# Patient Record
Sex: Male | Born: 1950 | Race: White | Hispanic: No | Smoking: Never smoker
Health system: Southern US, Community
[De-identification: ages and names within clinical notes are randomized; demographics above are authoritative.]

## PROBLEM LIST (undated history)

## (undated) DIAGNOSIS — C679 Malignant neoplasm of bladder, unspecified: Secondary | ICD-10-CM

## (undated) DIAGNOSIS — Z9289 Personal history of other medical treatment: Secondary | ICD-10-CM

## (undated) DIAGNOSIS — K2210 Ulcer of esophagus without bleeding: Secondary | ICD-10-CM

## (undated) DIAGNOSIS — N189 Chronic kidney disease, unspecified: Secondary | ICD-10-CM

## (undated) DIAGNOSIS — R7303 Prediabetes: Secondary | ICD-10-CM

## (undated) DIAGNOSIS — H269 Unspecified cataract: Secondary | ICD-10-CM

## (undated) DIAGNOSIS — G629 Polyneuropathy, unspecified: Secondary | ICD-10-CM

## (undated) DIAGNOSIS — Z9889 Other specified postprocedural states: Secondary | ICD-10-CM

## (undated) DIAGNOSIS — K219 Gastro-esophageal reflux disease without esophagitis: Secondary | ICD-10-CM

## (undated) DIAGNOSIS — C189 Malignant neoplasm of colon, unspecified: Secondary | ICD-10-CM

## (undated) DIAGNOSIS — R112 Nausea with vomiting, unspecified: Secondary | ICD-10-CM

## (undated) DIAGNOSIS — D126 Benign neoplasm of colon, unspecified: Secondary | ICD-10-CM

## (undated) DIAGNOSIS — I4891 Unspecified atrial fibrillation: Secondary | ICD-10-CM

## (undated) DIAGNOSIS — E785 Hyperlipidemia, unspecified: Secondary | ICD-10-CM

## (undated) DIAGNOSIS — K648 Other hemorrhoids: Secondary | ICD-10-CM

## (undated) DIAGNOSIS — K449 Diaphragmatic hernia without obstruction or gangrene: Secondary | ICD-10-CM

## (undated) DIAGNOSIS — I10 Essential (primary) hypertension: Secondary | ICD-10-CM

## (undated) HISTORY — DX: Malignant neoplasm of colon, unspecified: C18.9

## (undated) HISTORY — PX: CHOLECYSTECTOMY: SHX55

## (undated) HISTORY — DX: Personal history of other medical treatment: Z92.89

## (undated) HISTORY — PX: APPENDECTOMY: SHX54

## (undated) HISTORY — PX: COLON SURGERY: SHX602

## (undated) HISTORY — DX: Prediabetes: R73.03

## (undated) HISTORY — DX: Unspecified atrial fibrillation: I48.91

## (undated) HISTORY — DX: Polyneuropathy, unspecified: G62.9

## (undated) HISTORY — PX: CARDIAC CATHETERIZATION: SHX172

## (undated) HISTORY — DX: Malignant neoplasm of bladder, unspecified: C67.9

## (undated) HISTORY — DX: Diaphragmatic hernia without obstruction or gangrene: K44.9

## (undated) HISTORY — DX: Benign neoplasm of colon, unspecified: D12.6

## (undated) HISTORY — DX: Ulcer of esophagus without bleeding: K22.10

## (undated) HISTORY — PX: COLONOSCOPY: SHX174

## (undated) HISTORY — DX: Essential (primary) hypertension: I10

## (undated) HISTORY — DX: Unspecified cataract: H26.9

## (undated) HISTORY — DX: Other hemorrhoids: K64.8

---

## 1999-02-16 DIAGNOSIS — C189 Malignant neoplasm of colon, unspecified: Secondary | ICD-10-CM

## 1999-02-16 HISTORY — DX: Malignant neoplasm of colon, unspecified: C18.9

## 1999-08-24 ENCOUNTER — Other Ambulatory Visit: Admission: RE | Admit: 1999-08-24 | Discharge: 1999-08-24 | Payer: Self-pay | Admitting: Gastroenterology

## 1999-08-24 ENCOUNTER — Encounter (INDEPENDENT_AMBULATORY_CARE_PROVIDER_SITE_OTHER): Payer: Self-pay

## 1999-09-04 ENCOUNTER — Encounter: Payer: Self-pay | Admitting: General Surgery

## 1999-09-09 ENCOUNTER — Inpatient Hospital Stay (HOSPITAL_COMMUNITY): Admission: RE | Admit: 1999-09-09 | Discharge: 1999-09-13 | Payer: Self-pay | Admitting: General Surgery

## 1999-09-09 ENCOUNTER — Encounter (INDEPENDENT_AMBULATORY_CARE_PROVIDER_SITE_OTHER): Payer: Self-pay | Admitting: Specialist

## 1999-09-25 ENCOUNTER — Encounter: Payer: Self-pay | Admitting: Oncology

## 1999-09-25 ENCOUNTER — Encounter: Admission: RE | Admit: 1999-09-25 | Discharge: 1999-09-25 | Payer: Self-pay | Admitting: Oncology

## 1999-10-22 ENCOUNTER — Ambulatory Visit (HOSPITAL_BASED_OUTPATIENT_CLINIC_OR_DEPARTMENT_OTHER): Admission: RE | Admit: 1999-10-22 | Discharge: 1999-10-22 | Payer: Self-pay | Admitting: General Surgery

## 1999-10-22 ENCOUNTER — Encounter: Payer: Self-pay | Admitting: General Surgery

## 2000-03-28 ENCOUNTER — Encounter: Admission: RE | Admit: 2000-03-28 | Discharge: 2000-03-28 | Payer: Self-pay | Admitting: Oncology

## 2000-03-28 ENCOUNTER — Encounter: Payer: Self-pay | Admitting: Oncology

## 2000-05-15 ENCOUNTER — Encounter: Payer: Self-pay | Admitting: Emergency Medicine

## 2000-05-15 ENCOUNTER — Inpatient Hospital Stay (HOSPITAL_COMMUNITY): Admission: EM | Admit: 2000-05-15 | Discharge: 2000-05-16 | Payer: Self-pay | Admitting: Emergency Medicine

## 2000-06-07 ENCOUNTER — Ambulatory Visit (HOSPITAL_BASED_OUTPATIENT_CLINIC_OR_DEPARTMENT_OTHER): Admission: RE | Admit: 2000-06-07 | Discharge: 2000-06-07 | Payer: Self-pay | Admitting: General Surgery

## 2000-07-12 ENCOUNTER — Encounter (INDEPENDENT_AMBULATORY_CARE_PROVIDER_SITE_OTHER): Payer: Self-pay

## 2000-07-12 ENCOUNTER — Other Ambulatory Visit: Admission: RE | Admit: 2000-07-12 | Discharge: 2000-07-12 | Payer: Self-pay | Admitting: Gastroenterology

## 2000-08-15 ENCOUNTER — Encounter: Payer: Self-pay | Admitting: Oncology

## 2000-08-15 ENCOUNTER — Encounter: Admission: RE | Admit: 2000-08-15 | Discharge: 2000-08-15 | Payer: Self-pay

## 2001-03-27 ENCOUNTER — Encounter: Payer: Self-pay | Admitting: Oncology

## 2001-03-27 ENCOUNTER — Encounter: Admission: RE | Admit: 2001-03-27 | Discharge: 2001-03-27 | Payer: Self-pay | Admitting: Oncology

## 2001-06-21 ENCOUNTER — Encounter: Admission: RE | Admit: 2001-06-21 | Discharge: 2001-09-19 | Payer: Self-pay

## 2001-09-22 ENCOUNTER — Encounter: Admission: RE | Admit: 2001-09-22 | Discharge: 2001-11-14 | Payer: Self-pay

## 2001-10-09 ENCOUNTER — Encounter: Payer: Self-pay | Admitting: Oncology

## 2001-10-09 ENCOUNTER — Encounter: Admission: RE | Admit: 2001-10-09 | Discharge: 2001-10-09 | Payer: Self-pay | Admitting: Oncology

## 2001-10-10 ENCOUNTER — Ambulatory Visit (HOSPITAL_COMMUNITY): Admission: RE | Admit: 2001-10-10 | Discharge: 2001-10-10 | Payer: Self-pay | Admitting: Oncology

## 2001-10-10 ENCOUNTER — Encounter: Payer: Self-pay | Admitting: Oncology

## 2001-11-29 ENCOUNTER — Encounter: Admission: RE | Admit: 2001-11-29 | Discharge: 2002-02-27 | Payer: Self-pay

## 2002-03-19 ENCOUNTER — Ambulatory Visit (HOSPITAL_COMMUNITY): Admission: RE | Admit: 2002-03-19 | Discharge: 2002-03-19 | Payer: Self-pay | Admitting: Oncology

## 2002-03-19 ENCOUNTER — Encounter: Payer: Self-pay | Admitting: Oncology

## 2002-04-26 ENCOUNTER — Encounter: Admission: RE | Admit: 2002-04-26 | Discharge: 2002-07-25 | Payer: Self-pay

## 2002-08-09 ENCOUNTER — Encounter
Admission: RE | Admit: 2002-08-09 | Discharge: 2002-11-07 | Payer: Self-pay | Admitting: Physical Medicine & Rehabilitation

## 2002-09-05 ENCOUNTER — Ambulatory Visit (HOSPITAL_COMMUNITY): Admission: RE | Admit: 2002-09-05 | Discharge: 2002-09-05 | Payer: Self-pay | Admitting: Oncology

## 2002-09-05 ENCOUNTER — Encounter: Payer: Self-pay | Admitting: Oncology

## 2002-10-08 ENCOUNTER — Encounter: Payer: Self-pay | Admitting: Neurosurgery

## 2002-10-09 ENCOUNTER — Ambulatory Visit (HOSPITAL_COMMUNITY): Admission: RE | Admit: 2002-10-09 | Discharge: 2002-10-09 | Payer: Self-pay | Admitting: Neurosurgery

## 2002-10-09 ENCOUNTER — Encounter: Payer: Self-pay | Admitting: Neurosurgery

## 2002-10-12 ENCOUNTER — Inpatient Hospital Stay (HOSPITAL_COMMUNITY): Admission: AD | Admit: 2002-10-12 | Discharge: 2002-10-16 | Payer: Self-pay | Admitting: Neurosurgery

## 2002-10-29 ENCOUNTER — Encounter: Payer: Self-pay | Admitting: Oncology

## 2002-10-29 ENCOUNTER — Encounter: Admission: RE | Admit: 2002-10-29 | Discharge: 2002-10-29 | Payer: Self-pay | Admitting: Oncology

## 2002-11-06 ENCOUNTER — Encounter: Payer: Self-pay | Admitting: Neurosurgery

## 2002-11-06 ENCOUNTER — Ambulatory Visit (HOSPITAL_COMMUNITY): Admission: RE | Admit: 2002-11-06 | Discharge: 2002-11-07 | Payer: Self-pay | Admitting: Neurosurgery

## 2002-12-03 ENCOUNTER — Encounter
Admission: RE | Admit: 2002-12-03 | Discharge: 2003-03-03 | Payer: Self-pay | Admitting: Physical Medicine & Rehabilitation

## 2003-03-19 ENCOUNTER — Encounter
Admission: RE | Admit: 2003-03-19 | Discharge: 2003-06-17 | Payer: Self-pay | Admitting: Physical Medicine & Rehabilitation

## 2003-03-26 ENCOUNTER — Ambulatory Visit (HOSPITAL_COMMUNITY): Admission: RE | Admit: 2003-03-26 | Discharge: 2003-03-26 | Payer: Self-pay | Admitting: Oncology

## 2003-05-06 ENCOUNTER — Encounter (INDEPENDENT_AMBULATORY_CARE_PROVIDER_SITE_OTHER): Payer: Self-pay | Admitting: Specialist

## 2003-05-06 ENCOUNTER — Ambulatory Visit (HOSPITAL_COMMUNITY): Admission: RE | Admit: 2003-05-06 | Discharge: 2003-05-06 | Payer: Self-pay | Admitting: Urology

## 2003-05-06 ENCOUNTER — Ambulatory Visit (HOSPITAL_BASED_OUTPATIENT_CLINIC_OR_DEPARTMENT_OTHER): Admission: RE | Admit: 2003-05-06 | Discharge: 2003-05-06 | Payer: Self-pay | Admitting: Urology

## 2003-05-11 ENCOUNTER — Observation Stay (HOSPITAL_COMMUNITY): Admission: EM | Admit: 2003-05-11 | Discharge: 2003-05-11 | Payer: Self-pay | Admitting: Emergency Medicine

## 2003-05-13 ENCOUNTER — Inpatient Hospital Stay (HOSPITAL_COMMUNITY): Admission: EM | Admit: 2003-05-13 | Discharge: 2003-05-17 | Payer: Self-pay | Admitting: Urology

## 2003-05-29 ENCOUNTER — Encounter: Admission: RE | Admit: 2003-05-29 | Discharge: 2003-05-29 | Payer: Self-pay | Admitting: Urology

## 2003-05-31 ENCOUNTER — Inpatient Hospital Stay (HOSPITAL_COMMUNITY): Admission: AD | Admit: 2003-05-31 | Discharge: 2003-06-07 | Payer: Self-pay | Admitting: Gastroenterology

## 2003-07-01 ENCOUNTER — Encounter
Admission: RE | Admit: 2003-07-01 | Discharge: 2003-09-29 | Payer: Self-pay | Admitting: Physical Medicine & Rehabilitation

## 2003-08-28 ENCOUNTER — Encounter: Payer: Self-pay | Admitting: Gastroenterology

## 2003-09-02 ENCOUNTER — Ambulatory Visit (HOSPITAL_BASED_OUTPATIENT_CLINIC_OR_DEPARTMENT_OTHER): Admission: RE | Admit: 2003-09-02 | Discharge: 2003-09-02 | Payer: Self-pay | Admitting: Urology

## 2003-09-25 ENCOUNTER — Encounter
Admission: RE | Admit: 2003-09-25 | Discharge: 2003-11-26 | Payer: Self-pay | Admitting: Physical Medicine & Rehabilitation

## 2003-11-26 ENCOUNTER — Encounter
Admission: RE | Admit: 2003-11-26 | Discharge: 2004-02-24 | Payer: Self-pay | Admitting: Physical Medicine & Rehabilitation

## 2003-11-27 ENCOUNTER — Ambulatory Visit: Payer: Self-pay | Admitting: Physical Medicine & Rehabilitation

## 2003-12-29 ENCOUNTER — Ambulatory Visit (HOSPITAL_COMMUNITY): Admission: RE | Admit: 2003-12-29 | Discharge: 2003-12-29 | Payer: Self-pay | Admitting: Family Medicine

## 2004-02-05 ENCOUNTER — Ambulatory Visit: Payer: Self-pay | Admitting: Physical Medicine & Rehabilitation

## 2004-02-16 DIAGNOSIS — C679 Malignant neoplasm of bladder, unspecified: Secondary | ICD-10-CM

## 2004-02-16 HISTORY — PX: BLADDER REMOVAL: SHX567

## 2004-02-16 HISTORY — PX: PROSTATECTOMY: SHX69

## 2004-02-16 HISTORY — DX: Malignant neoplasm of bladder, unspecified: C67.9

## 2004-03-27 ENCOUNTER — Ambulatory Visit: Payer: Self-pay | Admitting: Oncology

## 2004-04-07 ENCOUNTER — Inpatient Hospital Stay (HOSPITAL_COMMUNITY): Admission: EM | Admit: 2004-04-07 | Discharge: 2004-04-18 | Payer: Self-pay | Admitting: Emergency Medicine

## 2004-06-11 ENCOUNTER — Encounter: Admission: RE | Admit: 2004-06-11 | Discharge: 2004-06-11 | Payer: Self-pay | Admitting: Family Medicine

## 2004-08-20 ENCOUNTER — Encounter (HOSPITAL_COMMUNITY): Admission: RE | Admit: 2004-08-20 | Discharge: 2004-11-18 | Payer: Self-pay | Admitting: Nephrology

## 2004-09-14 ENCOUNTER — Ambulatory Visit: Payer: Self-pay | Admitting: Oncology

## 2004-09-15 ENCOUNTER — Ambulatory Visit (HOSPITAL_COMMUNITY): Admission: RE | Admit: 2004-09-15 | Discharge: 2004-09-15 | Payer: Self-pay | Admitting: Oncology

## 2004-11-07 IMAGING — CR DG ABDOMEN 2V
2 series · 2 of 2 positions shown · non-contrast
Comparison: 05/31/03.

CLINICAL DATA: Abdominal pain.  History of colon carcinoma. 
ABDOMEN, TWO VIEWS 06/02/03

[view not recorded (1 of 2)]
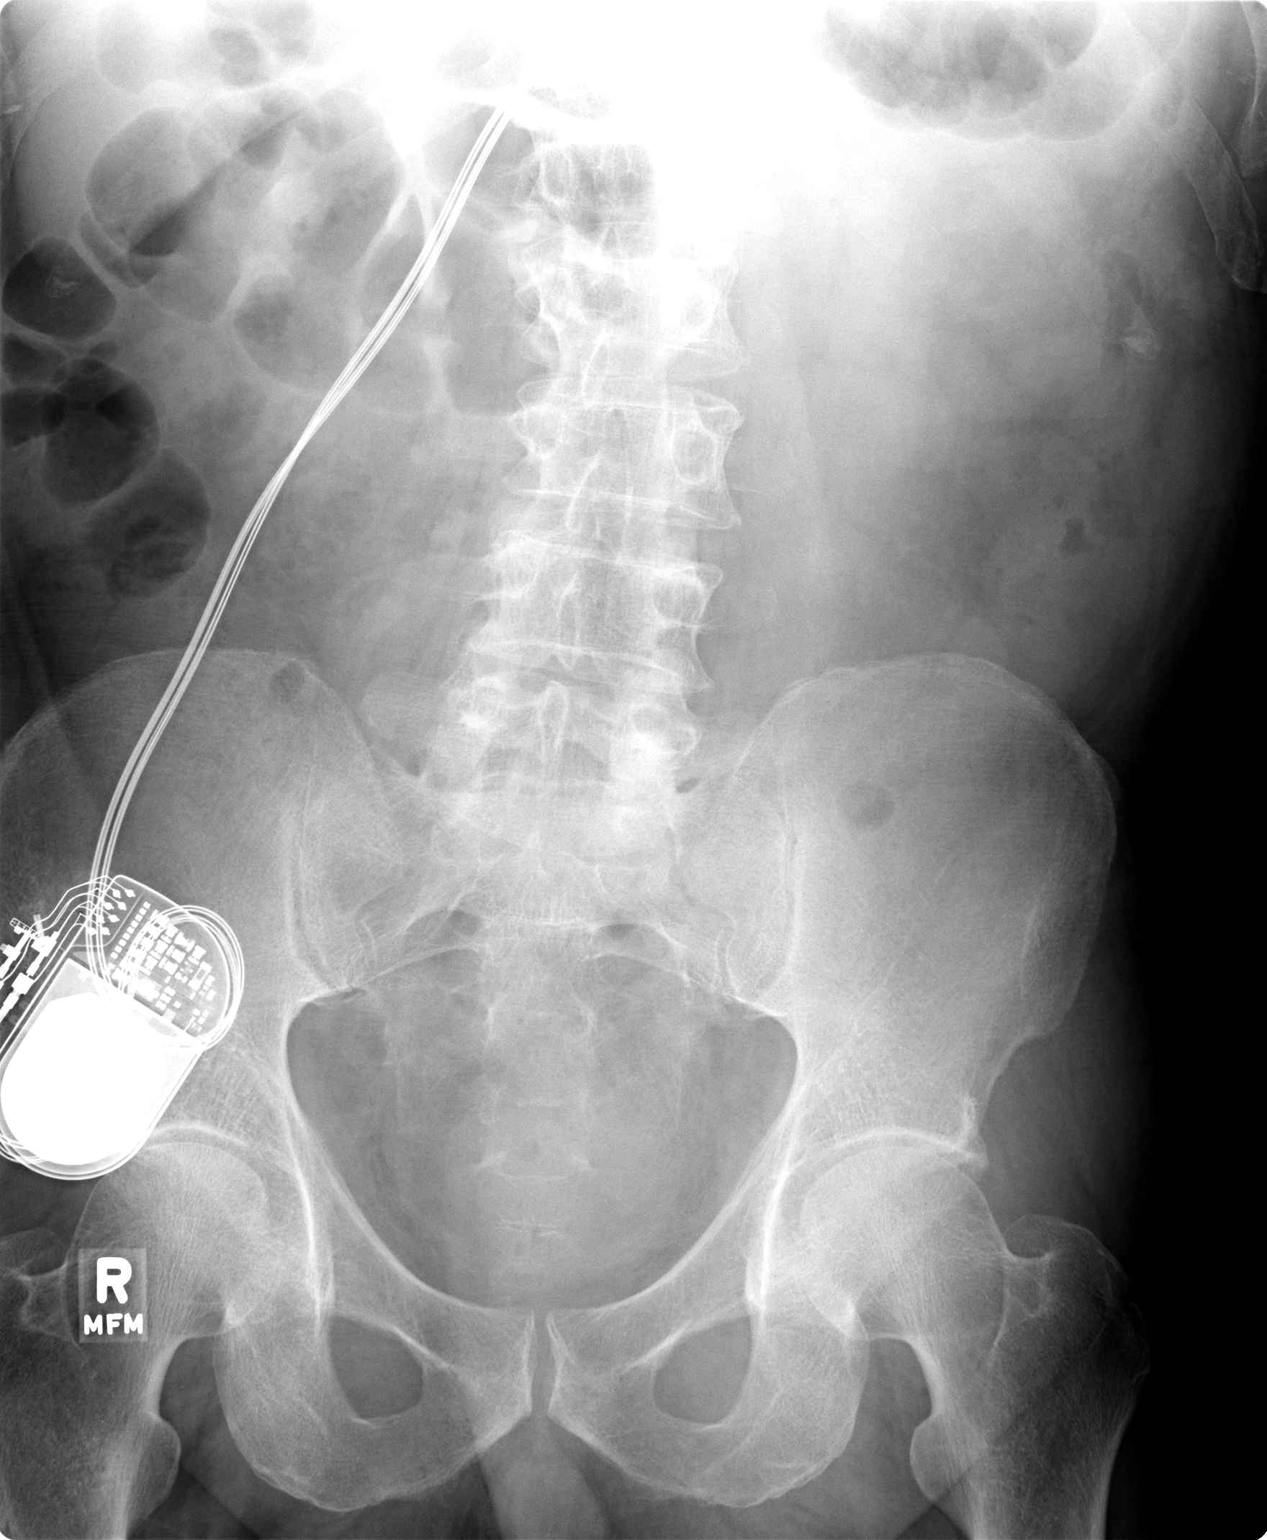

[view not recorded (2 of 2)]
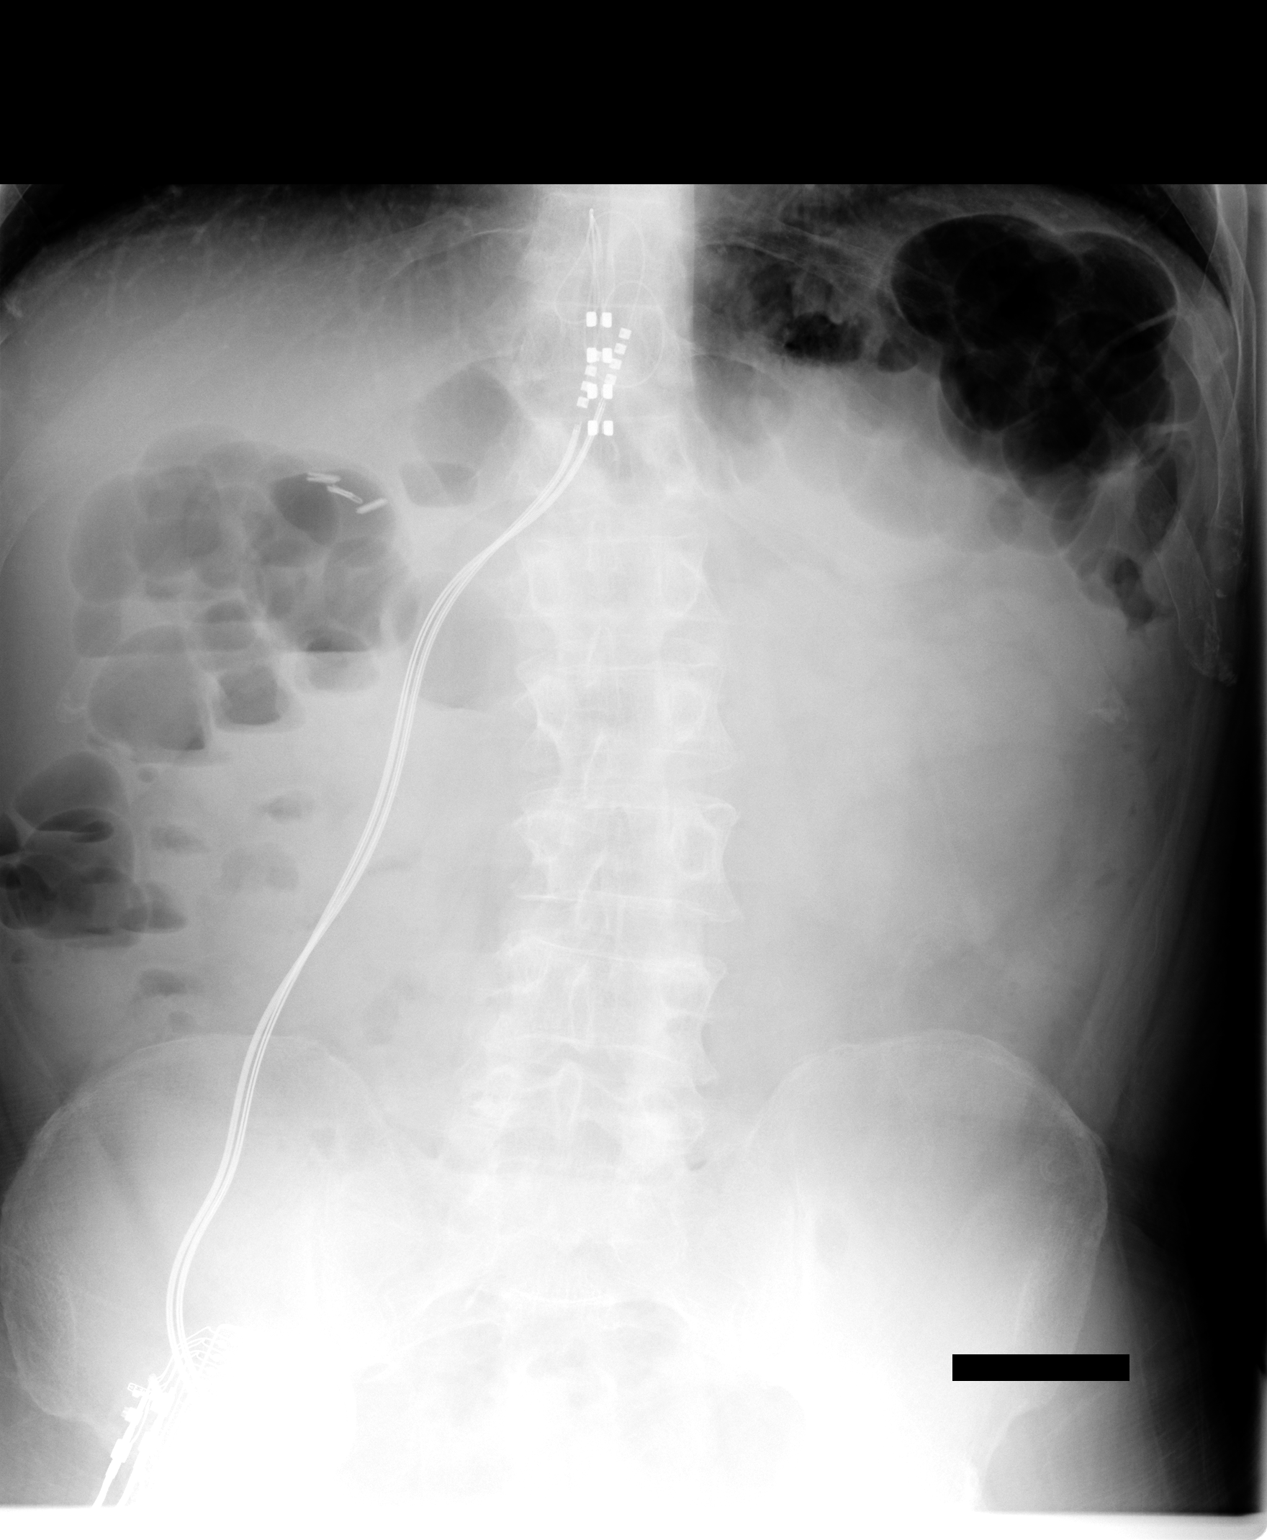

[2 of 2 positions shown; findings below may reference images not displayed]

FINDINGS: Decreasing gaseous distention of small bowel and colon is nonspecific.  No definite pneumoperitoneum.  Evidence of cholecystectomy and spinal implant/stimulator noted.  
IMPRESSION
Slightly decreasing mild gaseous distention of small bowel and colon.

## 2004-11-23 ENCOUNTER — Encounter
Admission: RE | Admit: 2004-11-23 | Discharge: 2005-02-21 | Payer: Self-pay | Admitting: Physical Medicine & Rehabilitation

## 2004-11-23 ENCOUNTER — Ambulatory Visit: Payer: Self-pay | Admitting: Physical Medicine & Rehabilitation

## 2004-12-21 ENCOUNTER — Ambulatory Visit (HOSPITAL_COMMUNITY): Admission: RE | Admit: 2004-12-21 | Discharge: 2004-12-21 | Payer: Self-pay | Admitting: Oncology

## 2004-12-28 ENCOUNTER — Ambulatory Visit: Payer: Self-pay | Admitting: Oncology

## 2004-12-31 ENCOUNTER — Ambulatory Visit: Payer: Self-pay | Admitting: Gastroenterology

## 2005-01-01 ENCOUNTER — Ambulatory Visit: Payer: Self-pay | Admitting: Gastroenterology

## 2005-01-01 ENCOUNTER — Encounter (INDEPENDENT_AMBULATORY_CARE_PROVIDER_SITE_OTHER): Payer: Self-pay | Admitting: *Deleted

## 2005-01-18 ENCOUNTER — Ambulatory Visit: Payer: Self-pay | Admitting: Physical Medicine & Rehabilitation

## 2005-04-16 ENCOUNTER — Encounter
Admission: RE | Admit: 2005-04-16 | Discharge: 2005-07-15 | Payer: Self-pay | Admitting: Physical Medicine & Rehabilitation

## 2005-04-16 ENCOUNTER — Ambulatory Visit: Payer: Self-pay | Admitting: Physical Medicine & Rehabilitation

## 2005-06-14 ENCOUNTER — Ambulatory Visit: Payer: Self-pay | Admitting: Gastroenterology

## 2005-07-19 ENCOUNTER — Encounter
Admission: RE | Admit: 2005-07-19 | Discharge: 2005-10-17 | Payer: Self-pay | Admitting: Physical Medicine & Rehabilitation

## 2005-07-27 ENCOUNTER — Ambulatory Visit: Payer: Self-pay | Admitting: Physical Medicine & Rehabilitation

## 2005-10-26 ENCOUNTER — Encounter
Admission: RE | Admit: 2005-10-26 | Discharge: 2006-01-24 | Payer: Self-pay | Admitting: Physical Medicine & Rehabilitation

## 2005-10-26 ENCOUNTER — Ambulatory Visit: Payer: Self-pay | Admitting: Physical Medicine & Rehabilitation

## 2005-11-17 IMAGING — CR DG ABDOMEN 1V
2 series · 2 of 2 positions shown · non-contrast
Comparison: 06/02/03.

CLINICAL DATA: Bladder cancer, abdominal pain.  Check double J ureteral stent placement.
 ABDOMEN, ONE VIEW:

[t abdomen supine (1 of 2)]
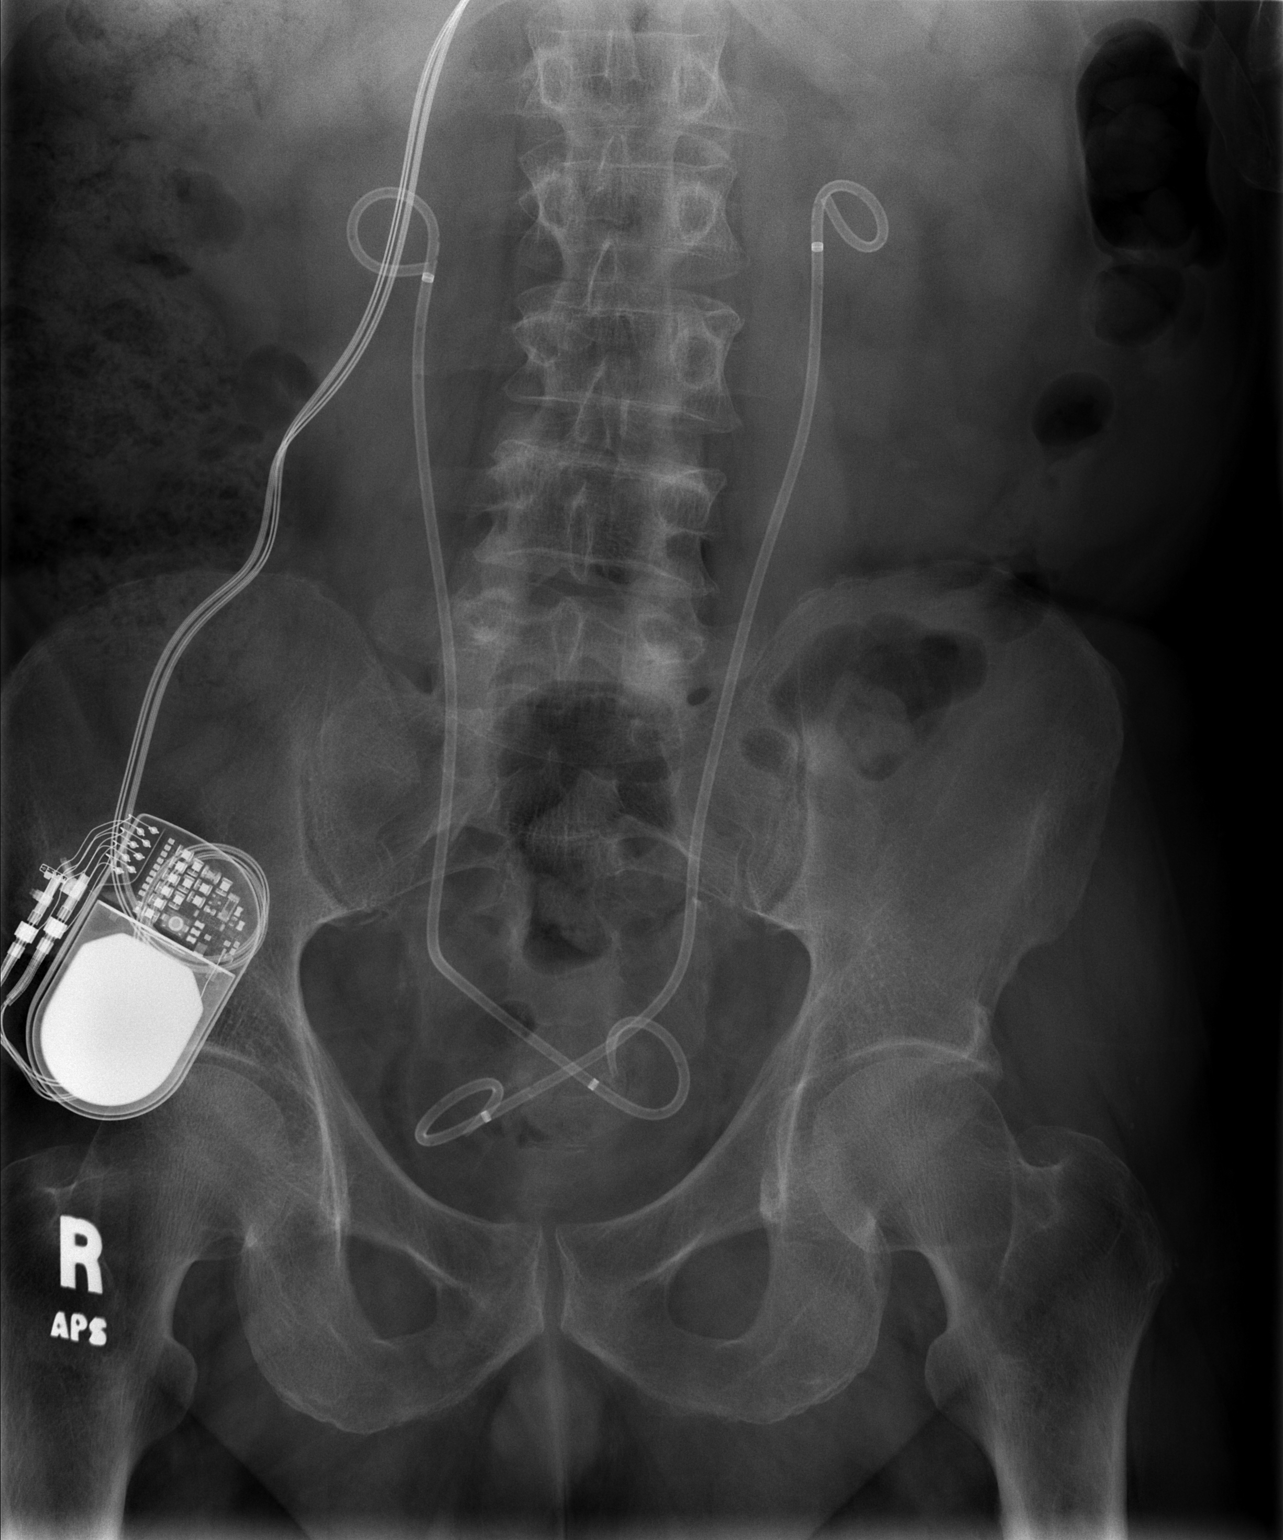

[t abdomen supine (2 of 2)]
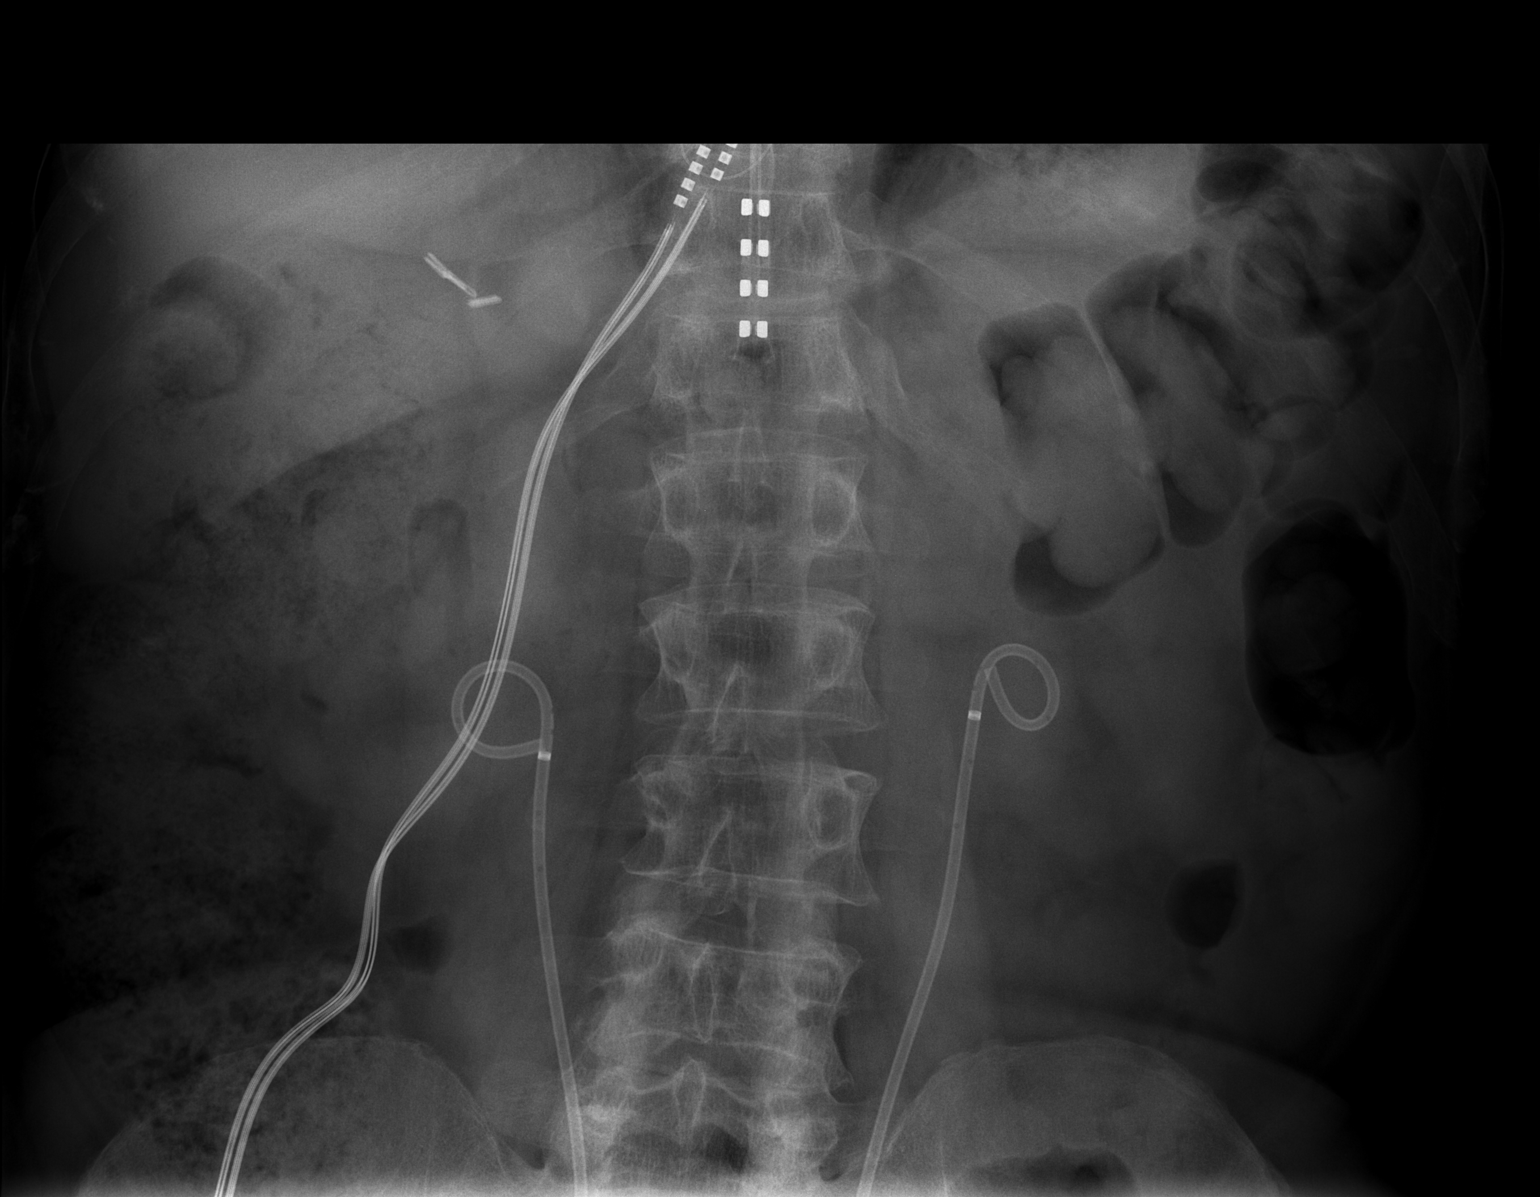

[2 of 2 positions shown; findings below may reference images not displayed]

There are bilateral double J ureteral stents that appear to be appropriately positioned.  Bowel gas pattern is normal. Electronic pacing device projecting over the right abdomen.
IMPRESSION: Bilateral double J ureteral stents appear to be appropriately placed in one view.

## 2005-12-17 ENCOUNTER — Ambulatory Visit: Payer: Self-pay | Admitting: Oncology

## 2005-12-28 LAB — CBC WITH DIFFERENTIAL/PLATELET
BASO%: 1.4 % (ref 0.0–2.0)
Basophils Absolute: 0.1 10*3/uL (ref 0.0–0.1)
EOS%: 3.5 % (ref 0.0–7.0)
Eosinophils Absolute: 0.2 10*3/uL (ref 0.0–0.5)
HCT: 43.4 % (ref 38.7–49.9)
HGB: 14.6 g/dL (ref 13.0–17.1)
LYMPH%: 32.1 % (ref 14.0–48.0)
MCH: 28.8 pg (ref 28.0–33.4)
MCHC: 33.7 g/dL (ref 32.0–35.9)
MCV: 85.6 fL (ref 81.6–98.0)
MONO#: 0.6 10*3/uL (ref 0.1–0.9)
MONO%: 9.8 % (ref 0.0–13.0)
NEUT#: 3.4 10*3/uL (ref 1.5–6.5)
NEUT%: 53.2 % (ref 40.0–75.0)
Platelets: 229 10*3/uL (ref 145–400)
RBC: 5.08 10*6/uL (ref 4.20–5.71)
RDW: 14.1 % (ref 11.2–14.6)
WBC: 6.5 10*3/uL (ref 4.0–10.0)
lymph#: 2.1 10*3/uL (ref 0.9–3.3)

## 2005-12-28 LAB — COMPREHENSIVE METABOLIC PANEL
ALT: 14 U/L (ref 0–53)
AST: 17 U/L (ref 0–37)
Albumin: 4.3 g/dL (ref 3.5–5.2)
Alkaline Phosphatase: 126 U/L — ABNORMAL HIGH (ref 39–117)
BUN: 20 mg/dL (ref 6–23)
CO2: 28 mEq/L (ref 19–32)
Calcium: 9.5 mg/dL (ref 8.4–10.5)
Chloride: 108 mEq/L (ref 96–112)
Creatinine, Ser: 1.49 mg/dL (ref 0.40–1.50)
Glucose, Bld: 98 mg/dL (ref 70–99)
Potassium: 4.6 mEq/L (ref 3.5–5.3)
Sodium: 140 mEq/L (ref 135–145)
Total Bilirubin: 0.5 mg/dL (ref 0.3–1.2)
Total Protein: 7.7 g/dL (ref 6.0–8.3)

## 2005-12-28 LAB — CEA: CEA: <0.5 ng/mL (ref 0.0–5.0)

## 2006-01-21 ENCOUNTER — Encounter
Admission: RE | Admit: 2006-01-21 | Discharge: 2006-04-21 | Payer: Self-pay | Admitting: Physical Medicine & Rehabilitation

## 2006-01-21 ENCOUNTER — Ambulatory Visit: Payer: Self-pay | Admitting: Physical Medicine & Rehabilitation

## 2006-03-17 ENCOUNTER — Ambulatory Visit (HOSPITAL_COMMUNITY): Admission: RE | Admit: 2006-03-17 | Discharge: 2006-03-17 | Payer: Self-pay | Admitting: Oncology

## 2006-08-29 ENCOUNTER — Encounter
Admission: RE | Admit: 2006-08-29 | Discharge: 2006-08-30 | Payer: Self-pay | Admitting: Physical Medicine & Rehabilitation

## 2006-08-29 ENCOUNTER — Ambulatory Visit: Payer: Self-pay | Admitting: Physical Medicine & Rehabilitation

## 2006-12-16 ENCOUNTER — Ambulatory Visit: Payer: Self-pay | Admitting: Gastroenterology

## 2006-12-16 ENCOUNTER — Ambulatory Visit: Payer: Self-pay | Admitting: Oncology

## 2006-12-22 LAB — COMPREHENSIVE METABOLIC PANEL
ALT: 17 U/L (ref 0–53)
AST: 19 U/L (ref 0–37)
Albumin: 4.3 g/dL (ref 3.5–5.2)
Alkaline Phosphatase: 103 U/L (ref 39–117)
BUN: 20 mg/dL (ref 6–23)
CO2: 23 mEq/L (ref 19–32)
Calcium: 9.9 mg/dL (ref 8.4–10.5)
Chloride: 104 mEq/L (ref 96–112)
Creatinine, Ser: 1.75 mg/dL — ABNORMAL HIGH (ref 0.40–1.50)
Glucose, Bld: 129 mg/dL — ABNORMAL HIGH (ref 70–99)
Potassium: 4.3 mEq/L (ref 3.5–5.3)
Sodium: 142 mEq/L (ref 135–145)
Total Bilirubin: 0.5 mg/dL (ref 0.3–1.2)
Total Protein: 8 g/dL (ref 6.0–8.3)

## 2006-12-22 LAB — CBC WITH DIFFERENTIAL/PLATELET
BASO%: 0.5 % (ref 0.0–2.0)
Basophils Absolute: 0 10*3/uL (ref 0.0–0.1)
EOS%: 3 % (ref 0.0–7.0)
Eosinophils Absolute: 0.2 10*3/uL (ref 0.0–0.5)
HCT: 43.6 % (ref 38.7–49.9)
HGB: 15 g/dL (ref 13.0–17.1)
LYMPH%: 31.3 % (ref 14.0–48.0)
MCH: 29.7 pg (ref 28.0–33.4)
MCHC: 34.3 g/dL (ref 32.0–35.9)
MCV: 86.6 fL (ref 81.6–98.0)
MONO#: 0.4 10*3/uL (ref 0.1–0.9)
MONO%: 6.4 % (ref 0.0–13.0)
NEUT#: 4 10*3/uL (ref 1.5–6.5)
NEUT%: 58.8 % (ref 40.0–75.0)
Platelets: 251 10*3/uL (ref 145–400)
RBC: 5.04 10*6/uL (ref 4.20–5.71)
RDW: 13.4 % (ref 11.2–14.6)
WBC: 6.8 10*3/uL (ref 4.0–10.0)
lymph#: 2.1 10*3/uL (ref 0.9–3.3)

## 2006-12-22 LAB — CEA: CEA: 0.5 ng/mL (ref 0.0–5.0)

## 2006-12-23 ENCOUNTER — Ambulatory Visit (HOSPITAL_COMMUNITY): Admission: RE | Admit: 2006-12-23 | Discharge: 2006-12-23 | Payer: Self-pay | Admitting: Oncology

## 2006-12-30 ENCOUNTER — Encounter: Payer: Self-pay | Admitting: Gastroenterology

## 2006-12-30 ENCOUNTER — Ambulatory Visit: Payer: Self-pay | Admitting: Gastroenterology

## 2007-09-21 ENCOUNTER — Encounter
Admission: RE | Admit: 2007-09-21 | Discharge: 2007-09-25 | Payer: Self-pay | Admitting: Physical Medicine & Rehabilitation

## 2007-09-25 ENCOUNTER — Ambulatory Visit: Payer: Self-pay | Admitting: Physical Medicine & Rehabilitation

## 2007-11-10 DIAGNOSIS — Z860100 Personal history of colon polyps, unspecified: Secondary | ICD-10-CM | POA: Insufficient documentation

## 2007-11-10 DIAGNOSIS — Z85038 Personal history of other malignant neoplasm of large intestine: Secondary | ICD-10-CM | POA: Insufficient documentation

## 2007-11-10 DIAGNOSIS — K219 Gastro-esophageal reflux disease without esophagitis: Secondary | ICD-10-CM | POA: Insufficient documentation

## 2007-11-14 ENCOUNTER — Ambulatory Visit: Payer: Self-pay | Admitting: Gastroenterology

## 2007-11-14 DIAGNOSIS — M542 Cervicalgia: Secondary | ICD-10-CM | POA: Insufficient documentation

## 2007-11-14 DIAGNOSIS — R1319 Other dysphagia: Secondary | ICD-10-CM | POA: Insufficient documentation

## 2007-11-15 ENCOUNTER — Ambulatory Visit: Payer: Self-pay | Admitting: Gastroenterology

## 2007-12-19 ENCOUNTER — Ambulatory Visit: Payer: Self-pay | Admitting: Oncology

## 2007-12-21 LAB — COMPREHENSIVE METABOLIC PANEL
ALT: 34 U/L (ref 0–53)
AST: 26 U/L (ref 0–37)
Albumin: 4.6 g/dL (ref 3.5–5.2)
Alkaline Phosphatase: 127 U/L — ABNORMAL HIGH (ref 39–117)
BUN: 25 mg/dL — ABNORMAL HIGH (ref 6–23)
CO2: 24 mEq/L (ref 19–32)
Calcium: 10.1 mg/dL (ref 8.4–10.5)
Chloride: 100 mEq/L (ref 96–112)
Creatinine, Ser: 1.94 mg/dL — ABNORMAL HIGH (ref 0.40–1.50)
Glucose, Bld: 165 mg/dL — ABNORMAL HIGH (ref 70–99)
Potassium: 4.8 mEq/L (ref 3.5–5.3)
Sodium: 138 mEq/L (ref 135–145)
Total Bilirubin: 0.7 mg/dL (ref 0.3–1.2)
Total Protein: 8.3 g/dL (ref 6.0–8.3)

## 2007-12-21 LAB — CBC WITH DIFFERENTIAL/PLATELET
BASO%: 0.3 % (ref 0.0–2.0)
Basophils Absolute: 0 10*3/uL (ref 0.0–0.1)
EOS%: 2.5 % (ref 0.0–7.0)
Eosinophils Absolute: 0.2 10*3/uL (ref 0.0–0.5)
HCT: 46.3 % (ref 38.7–49.9)
HGB: 15.9 g/dL (ref 13.0–17.1)
LYMPH%: 22.2 % (ref 14.0–48.0)
MCH: 30 pg (ref 28.0–33.4)
MCHC: 34.3 g/dL (ref 32.0–35.9)
MCV: 87.4 fL (ref 81.6–98.0)
MONO#: 0.4 10*3/uL (ref 0.1–0.9)
MONO%: 6.1 % (ref 0.0–13.0)
NEUT#: 4.7 10*3/uL (ref 1.5–6.5)
NEUT%: 68.9 % (ref 40.0–75.0)
Platelets: 189 10*3/uL (ref 145–400)
RBC: 5.3 10*6/uL (ref 4.20–5.71)
RDW: 13.3 % (ref 11.2–14.6)
WBC: 6.9 10*3/uL (ref 4.0–10.0)
lymph#: 1.5 10*3/uL (ref 0.9–3.3)

## 2007-12-21 LAB — CEA: CEA: <0.5 ng/mL (ref 0.0–5.0)

## 2007-12-25 ENCOUNTER — Ambulatory Visit (HOSPITAL_COMMUNITY): Admission: RE | Admit: 2007-12-25 | Discharge: 2007-12-25 | Payer: Self-pay | Admitting: Oncology

## 2007-12-28 ENCOUNTER — Encounter: Payer: Self-pay | Admitting: Gastroenterology

## 2008-01-02 ENCOUNTER — Encounter: Admission: RE | Admit: 2008-01-02 | Discharge: 2008-01-02 | Payer: Self-pay | Admitting: Nephrology

## 2008-05-23 ENCOUNTER — Encounter
Admission: RE | Admit: 2008-05-23 | Discharge: 2008-05-27 | Payer: Self-pay | Admitting: Physical Medicine & Rehabilitation

## 2008-05-27 ENCOUNTER — Ambulatory Visit: Payer: Self-pay | Admitting: Physical Medicine & Rehabilitation

## 2008-07-19 ENCOUNTER — Encounter
Admission: RE | Admit: 2008-07-19 | Discharge: 2008-07-19 | Payer: Self-pay | Admitting: Physical Medicine & Rehabilitation

## 2008-08-27 ENCOUNTER — Encounter
Admission: RE | Admit: 2008-08-27 | Discharge: 2008-08-27 | Payer: Self-pay | Admitting: Physical Medicine & Rehabilitation

## 2008-08-27 ENCOUNTER — Ambulatory Visit: Payer: Self-pay | Admitting: Physical Medicine & Rehabilitation

## 2008-11-18 ENCOUNTER — Encounter
Admission: RE | Admit: 2008-11-18 | Discharge: 2008-12-18 | Payer: Self-pay | Admitting: Physical Medicine & Rehabilitation

## 2008-12-11 ENCOUNTER — Ambulatory Visit: Payer: Self-pay | Admitting: Physical Medicine & Rehabilitation

## 2008-12-12 ENCOUNTER — Ambulatory Visit: Payer: Self-pay | Admitting: Oncology

## 2008-12-17 ENCOUNTER — Ambulatory Visit (HOSPITAL_COMMUNITY): Admission: RE | Admit: 2008-12-17 | Discharge: 2008-12-17 | Payer: Self-pay | Admitting: Oncology

## 2008-12-17 LAB — CBC WITH DIFFERENTIAL/PLATELET
BASO%: 0.8 % (ref 0.0–2.0)
Basophils Absolute: 0 10*3/uL (ref 0.0–0.1)
EOS%: 5.4 % (ref 0.0–7.0)
Eosinophils Absolute: 0.4 10*3/uL (ref 0.0–0.5)
HCT: 44.6 % (ref 38.4–49.9)
HGB: 15 g/dL (ref 13.0–17.1)
LYMPH%: 38.5 % (ref 14.0–49.0)
MCH: 29.8 pg (ref 27.2–33.4)
MCHC: 33.7 g/dL (ref 32.0–36.0)
MCV: 88.6 fL (ref 79.3–98.0)
MONO#: 0.7 10*3/uL (ref 0.1–0.9)
MONO%: 10.5 % (ref 0.0–14.0)
NEUT#: 2.9 10*3/uL (ref 1.5–6.5)
NEUT%: 44.8 % (ref 39.0–75.0)
Platelets: 179 10*3/uL (ref 140–400)
RBC: 5.03 10*6/uL (ref 4.20–5.82)
RDW: 13.1 % (ref 11.0–14.6)
WBC: 6.6 10*3/uL (ref 4.0–10.3)
lymph#: 2.5 10*3/uL (ref 0.9–3.3)

## 2008-12-17 LAB — COMPREHENSIVE METABOLIC PANEL
ALT: 29 U/L (ref 0–53)
AST: 30 U/L (ref 0–37)
Albumin: 4.1 g/dL (ref 3.5–5.2)
Alkaline Phosphatase: 117 U/L (ref 39–117)
BUN: 20 mg/dL (ref 6–23)
CO2: 28 mEq/L (ref 19–32)
Calcium: 9.7 mg/dL (ref 8.4–10.5)
Chloride: 101 mEq/L (ref 96–112)
Creatinine, Ser: 1.47 mg/dL (ref 0.40–1.50)
Glucose, Bld: 97 mg/dL (ref 70–99)
Potassium: 4.3 mEq/L (ref 3.5–5.3)
Sodium: 137 mEq/L (ref 135–145)
Total Bilirubin: 0.7 mg/dL (ref 0.3–1.2)
Total Protein: 8.4 g/dL — ABNORMAL HIGH (ref 6.0–8.3)

## 2008-12-17 LAB — CEA: CEA: 0.6 ng/mL (ref 0.0–5.0)

## 2008-12-23 ENCOUNTER — Encounter: Payer: Self-pay | Admitting: Gastroenterology

## 2009-07-21 ENCOUNTER — Telehealth (INDEPENDENT_AMBULATORY_CARE_PROVIDER_SITE_OTHER): Payer: Self-pay | Admitting: *Deleted

## 2009-10-12 ENCOUNTER — Emergency Department (HOSPITAL_BASED_OUTPATIENT_CLINIC_OR_DEPARTMENT_OTHER): Admission: EM | Admit: 2009-10-12 | Discharge: 2009-10-12 | Payer: Self-pay | Admitting: Emergency Medicine

## 2009-10-30 ENCOUNTER — Encounter (INDEPENDENT_AMBULATORY_CARE_PROVIDER_SITE_OTHER): Payer: Self-pay | Admitting: *Deleted

## 2009-11-10 ENCOUNTER — Encounter (INDEPENDENT_AMBULATORY_CARE_PROVIDER_SITE_OTHER): Payer: Self-pay | Admitting: *Deleted

## 2009-12-15 ENCOUNTER — Ambulatory Visit: Payer: Self-pay | Admitting: Oncology

## 2009-12-16 DIAGNOSIS — D126 Benign neoplasm of colon, unspecified: Secondary | ICD-10-CM

## 2009-12-16 HISTORY — DX: Benign neoplasm of colon, unspecified: D12.6

## 2009-12-17 ENCOUNTER — Ambulatory Visit (HOSPITAL_COMMUNITY): Admission: RE | Admit: 2009-12-17 | Discharge: 2009-12-17 | Payer: Self-pay | Admitting: Oncology

## 2009-12-17 LAB — COMPREHENSIVE METABOLIC PANEL
ALT: 26 U/L (ref 0–53)
AST: 31 U/L (ref 0–37)
Albumin: 3.7 g/dL (ref 3.5–5.2)
Alkaline Phosphatase: 65 U/L (ref 39–117)
BUN: 22 mg/dL (ref 6–23)
CO2: 30 mEq/L (ref 19–32)
Calcium: 9.8 mg/dL (ref 8.4–10.5)
Chloride: 101 mEq/L (ref 96–112)
Creatinine, Ser: 2.04 mg/dL — ABNORMAL HIGH (ref 0.40–1.50)
Glucose, Bld: 96 mg/dL (ref 70–99)
Potassium: 4.1 mEq/L (ref 3.5–5.3)
Sodium: 139 mEq/L (ref 135–145)
Total Bilirubin: 1 mg/dL (ref 0.3–1.2)
Total Protein: 8.2 g/dL (ref 6.0–8.3)

## 2009-12-17 LAB — CBC WITH DIFFERENTIAL/PLATELET
BASO%: 0.7 % (ref 0.0–2.0)
Basophils Absolute: 0.1 10*3/uL (ref 0.0–0.1)
EOS%: 3.3 % (ref 0.0–7.0)
Eosinophils Absolute: 0.3 10*3/uL (ref 0.0–0.5)
HCT: 40.3 % (ref 38.4–49.9)
HGB: 13.5 g/dL (ref 13.0–17.1)
LYMPH%: 32.3 % (ref 14.0–49.0)
MCH: 29.5 pg (ref 27.2–33.4)
MCHC: 33.4 g/dL (ref 32.0–36.0)
MCV: 88.3 fL (ref 79.3–98.0)
MONO#: 0.8 10*3/uL (ref 0.1–0.9)
MONO%: 10 % (ref 0.0–14.0)
NEUT#: 4 10*3/uL (ref 1.5–6.5)
NEUT%: 53.7 % (ref 39.0–75.0)
Platelets: 226 10*3/uL (ref 140–400)
RBC: 4.57 10*6/uL (ref 4.20–5.82)
RDW: 13.3 % (ref 11.0–14.6)
WBC: 7.5 10*3/uL (ref 4.0–10.3)
lymph#: 2.4 10*3/uL (ref 0.9–3.3)

## 2009-12-17 LAB — CEA: CEA: <0.5 ng/mL (ref 0.0–5.0)

## 2009-12-18 ENCOUNTER — Encounter (INDEPENDENT_AMBULATORY_CARE_PROVIDER_SITE_OTHER): Payer: Self-pay | Admitting: *Deleted

## 2009-12-24 ENCOUNTER — Ambulatory Visit: Payer: Self-pay | Admitting: Gastroenterology

## 2010-01-07 ENCOUNTER — Ambulatory Visit: Payer: Self-pay | Admitting: Gastroenterology

## 2010-01-12 ENCOUNTER — Encounter: Payer: Self-pay | Admitting: Gastroenterology

## 2010-03-07 ENCOUNTER — Encounter: Payer: Self-pay | Admitting: Family Medicine

## 2010-03-07 ENCOUNTER — Encounter: Payer: Self-pay | Admitting: Oncology

## 2010-03-08 ENCOUNTER — Encounter: Payer: Self-pay | Admitting: Urology

## 2010-03-19 NOTE — Procedures (Signed)
Summary: Colonoscopy  Patient: [name] Note: All result statuses are Final unless otherwise noted.  Tests: (1) Colonoscopy (COL)   COL Colonoscopy           DONE (C)     [facility]     [address]     [place], [place]  [address]           COLONOSCOPY PROCEDURE REPORT     PATIENT:  Mark [name], Mark [name]  MR#:  [id number]     BIRTHDATE:  [birth year], 59 yrs. old  GENDER:  male     ENDOSCOPIST:  Judie Petit T. Russella Dar, MD, [facility]           PROCEDURE DATE:  01/07/2010     PROCEDURE:  Colonoscopy with snare polypectomy     ASA CLASS:  Class II     INDICATIONS:  1) surveillance and high-risk screening  2) history     of colon cancer: T2, N1 in 2001.     MEDICATIONS:   Fentanyl 75 mcg IV, Versed 7 mg IV, Benadryl 25 mg     IV     DESCRIPTION OF PROCEDURE:   After the risks benefits and     alternatives of the procedure were thoroughly explained, informed     consent was obtained.  Digital rectal exam was performed and     revealed no abnormalities.   The LB [id number]-[id number] endoscope     was introduced through the anus and advanced to the cecum, which     was identified by both the appendix and ileocecal valve, without     limitations.  The quality of the prep was good, using MoviPrep.     The instrument was then slowly withdrawn as the colon was fully     examined.     <<PROCEDUREIMAGES>>     FINDINGS:  A sessile polyp was found in the mid transverse colon.     It was 5 mm in size. Polyp was snared without cautery. Retrieval     was successful.   There was evidence of a prior segmental     colectomy in the sigmoid colon. A normal appearing cecum,     ileocecal valve, and appendiceal orifice were identified. The     ascending, hepatic flexure, splenic flexure, descending colon, and     rectum appeared unremarkable. Retroflexed views in the rectum     revealed internal hemorrhoids, small.  The time to cecum =  1.75     minutes. The scope was then withdrawn (time =  11.75  min)  from the     patient and the procedure completed.           COMPLICATIONS:  None           ENDOSCOPIC IMPRESSION:     1) 5 mm sessile polyp in the mid transverse colon     2) Prior segmental colectomy in the sigmoid colon     3) Internal hemorrhoids           RECOMMENDATIONS:     1) Await pathology results     2) Repeat Colonoscopy in 3 years, pending pathology reveiw.           Venita Lick. Russella Dar, MD, [facility]           CC: Ruthann Cancer, MD           n.     REVISED:  01/07/2010 11:31 AM     eSIGNED:   Judie Petit  Dellie Burns at 01/07/2010 11:31 AM           Riley Nearing, [id number]  Note: An exclamation mark (!) indicates a result that was not dispersed into the flowsheet. Document Creation Date: 01/07/2010 11:31 AM _______________________________________________________________________  (1) Order result status: Final Collection or observation date-time: 01/07/2010 11:23 Requested date-time:  Receipt date-time:  Reported date-time:  Referring Physician:   Ordering Physician: Claudette Head [phone]) Specimen Source:  Source: Launa Grill Order Number: [id number] Lab site:   Appended Document: Colonoscopy     Procedures Next Due Date:    Colonoscopy: 12/2012

## 2010-03-19 NOTE — Miscellaneous (Signed)
Summary: [facility]  Clinical Lists Changes  Medications: Added new medication of MOVIPREP 100 GM  SOLR (PEG-KCL-NACL-NASULF-NA ASC-C) As per prep instructions. - Signed Rx of MOVIPREP 100 GM  SOLR (PEG-KCL-NACL-NASULF-NA ASC-C) As per prep instructions.;  #1 x 0;  Signed;  Entered by: Durwin Glaze RN;  Authorized by: Meryl Dare MD [facility];  Method used: Electron[facility] to [facility] [phone]*, [address] [place], [place], [place]  [address], Ph: [phone], Fax: [phone] Observations: Added new observation of ALLERGY REV: Done (12/24/2009 10:46)    Prescriptions: MOVIPREP 100 GM  SOLR (PEG-KCL-NACL-NASULF-NA ASC-C) As per prep instructions.  #1 x 0   Entered by:   Durwin Glaze RN   Authorized by:   Meryl Dare MD [facility]   Signed by:   Durwin Glaze RN on 12/24/2009   Method used:   [facility]ron[facility] to        [facility] [phone]* (retail)       [address]       [place], [place]  [address]       Ph: [phone]       Fax: [phone]   RxID:   [id number]

## 2010-03-19 NOTE — Letter (Signed)
Summary: Colonoscopy Letter   Gastroenterology  [address] [place], [place] [address]   Phone: [phone]  Fax: [phone]      October 30, 2009 MRN: [id number]   Mark [name] [address] [place], [place]  [address]   Dear [name],   According to your medical record, it is time for you to schedule a Colonoscopy. The American Cancer Society recommends this procedure as a method to detect early colon cancer. Patients with a family history of colon cancer, or a personal history of colon polyps or inflammatory bowel disease are at increased risk.  This letter has beeen generated based on the recommendations made at the time of your procedure. If you feel that in your particular situation this may no longer apply, please contact our office.  Please call our office at [phone] to schedule this appointment or to update your records at your earliest convenience.  Thank you for cooperating with [place] to provide you with the very best care possible.   Sincerely,  Judie Petit T. Russella Dar, M.D.  [facility] Gastroenterology Division [phone]

## 2010-03-19 NOTE — Progress Notes (Signed)
  Phone Note Other Incoming   Request: Send information Summary of Call: Request for records received from  [employer]. Request forwarded to [employer].

## 2010-03-19 NOTE — Letter (Signed)
Summary: Moviprep Instructions  [facility]  [address]   [place], [place] [address]   Phone: [phone]  Fax: [phone]       Mark [name]    [birth year]    MRN: [id number]        Procedure Day Dorna Bloom: Wednesday, 01-07-10     Arrival Time: 10:00 a.m.     Procedure Time: 11:00 a.m.     Location of Procedure:                    x  [facility] (4th Floor)   PREPARATION FOR COLONOSCOPY WITH MOVIPREP   Starting 5 days prior to your procedure 01-02-10 do not eat nuts, seeds, popcorn, corn, beans, peas,  salads, or any raw vegetables.  Do not take any fiber supplements (e.g. Metamucil, Citrucel, and Benefiber).  THE DAY BEFORE YOUR PROCEDURE         DATE: 01-06-10  DAY: Tuesday  1.  Drink clear liquids the entire day-NO SOLID FOOD  2.  Do not drink anything colored red or purple.  Avoid juices with pulp.  No orange juice.  3.  Drink at least 64 oz. (8 glasses) of fluid/clear liquids during the day to prevent dehydration and help the prep work efficiently.  CLEAR LIQUIDS INCLUDE: Water Jello Ice Popsicles Tea (sugar ok, no milk/cream) Powdered fruit flavored drinks Coffee (sugar ok, no milk/cream) Gatorade Juice: apple, white grape, white cranberry  Lemonade Clear bullion, consomm, broth Carbonated beverages (any kind) Strained chicken noodle soup Hard Candy                             4.  In the morning, mix first dose of MoviPrep solution:    Empty 1 Pouch A and 1 Pouch B into the disposable container    Add lukewarm drinking water to the top line of the container. Mix to dissolve    Refrigerate (mixed solution should be used within 24 hrs)  5.  Begin drinking the prep at 5:00 p.m. The MoviPrep container is divided by 4 marks.   Every 15 minutes drink the solution down to the next mark (approximately 8 oz) until the full liter is complete.   6.  Follow completed prep with 16 oz of clear liquid of your choice (Nothing red or  purple).  Continue to drink clear liquids until bedtime.  7.  Before going to bed, mix second dose of MoviPrep solution:    Empty 1 Pouch A and 1 Pouch B into the disposable container    Add lukewarm drinking water to the top line of the container. Mix to dissolve    Refrigerate  THE DAY OF YOUR PROCEDURE      DATE: 01-07-10 DAY: Wednesday  Beginning at 6:00 a.m. (5 hours before procedure):         1. Every 15 minutes, drink the solution down to the next mark (approx 8 oz) until the full liter is complete.  2. Follow completed prep with 16 oz. of clear liquid of your choice.    3. You may drink clear liquids until 9:00 a.m. (2 HOURS BEFORE PROCEDURE).   MEDICATION INSTRUCTIONS  Unless otherwise instructed, you should take regular prescription medications with a small sip of water   as early as possible the morning of your procedure.          OTHER INSTRUCTIONS  You will need a responsible adult at least  60 years of age to accompany you and drive you home.   This person must remain in the waiting room during your procedure.  Wear loose fitting clothing that is easily removed.  Leave jewelry and other valuables at home.  However, you may wish to bring a book to read or  an iPod/MP3 player to listen to music as you wait for your procedure to start.  Remove all body piercing jewelry and leave at home.  Total time from sign-in until discharge is approximately 2-3 hours.  You should go home directly after your procedure and rest.  You can resume normal activities the  day after your procedure.  The day of your procedure you should not:   Drive   Make legal decisions   Operate machinery   Drink alcohol   Return to work  You will receive specific instructions about eating, activities and medications before you leave.    The above instructions have been reviewed and explained to me by   Durwin Glaze RN  December 24, 2009 11:40 AM     I fully understand and  can verbalize these instructions _____________________________ Date _________

## 2010-03-19 NOTE — Letter (Signed)
Summary: Patient Notice- Polyp Results  [facility]  [address] [place], [place] [address]   Phone: [phone]  Fax: [phone]        January 12, 2010 MRN: [id number]    Mark [name] [address] [place], [place]  [address]    Dear [name],  I am pleased to inform you that the colon polyp(s) removed during your recent colonoscopy was (were) found to be benign (no cancer detected) upon pathologic examination.  I recommend you have a repeat colonoscopy examination in 3 years to look for recurrent polyps, as having colon polyps increases your risk for having recurrent polyps or even colon cancer in the future.  Should you develop new or worsening symptoms of abdominal pain, bowel habit changes or bleeding from the rectum or bowels, please schedule an evaluation with either your primary care physician or with me.  Continue treatment plan as outlined the day of your exam.  Please call us if you are having persistent problems or have questions about your condition that have not been fully answered at this time.  Sincerely,  Meryl Dare MD [facility]  This letter has been electronically signed by your physician.  Appended Document: Patient Notice- Polyp Results Letter mailed

## 2010-03-19 NOTE — Letter (Signed)
Summary: Pre Visit Letter Revised  [facility]  [address] [place], [place] [address]   Phone: [phone]  Fax: [phone]        11/10/2009 MRN: [id number] Mark [name] [address] [place], [place]  [address]             Procedure Date:  01-07-10   Welcome to the Gastroenterology Division at [facility].    You are scheduled to see a nurse for your pre-procedure visit on 12-24-09 at 11:00a.m. on the 3rd floor at [facility], [address]  We ask that you try to arrive at our office 15 minutes prior to your appointment time to allow for check-in.  Please take a minute to review the attached form.  If you answer "Yes" to one or more of the questions on the first page, we ask that you call the person listed at your earliest opportunity.  If you answer "No" to all of the questions, please complete the rest of the form and bring it to your appointment.    Your nurse visit will consist of discussing your medical and surgical history, your immediate family medical history, and your medications.   If you are unable to list all of your medications on the form, please bring the medication bottles to your appointment and we will list them.  We will need to be aware of both prescribed and over the counter drugs.  We will need to know exact dosage information as well.    Please be prepared to read and sign documents such as consent forms, a financial agreement, and acknowledgement forms.  If necessary, and with your consent, a friend or relative is welcome to sit-in on the nurse visit with you.  Please bring your insurance card so that we may make a copy of it.  If your insurance requires a referral to see a specialist, please bring your referral form from your primary care physician.  No co-pay is required for this nurse visit.     If you cannot keep your appointment, please call [phone] to cancel or reschedule prior to your appointment date.  This allows  [place] the opportunity to schedule an appointment for another patient in need of care.    Thank you for choosing [facility] for your medical needs.  We appreciate the opportunity to care for you.  Please visit [place] at our website  to learn more about our practice.  Sincerely, The Gastroenterology Division

## 2010-05-01 LAB — CBC
HCT: 37.6 % — ABNORMAL LOW (ref 39.0–52.0)
Hemoglobin: 12.9 g/dL — ABNORMAL LOW (ref 13.0–17.0)
MCH: 30.4 pg (ref 26.0–34.0)
MCHC: 34.3 g/dL (ref 30.0–36.0)
MCV: 88.5 fL (ref 78.0–100.0)
Platelets: 165 10*3/uL (ref 150–400)
RBC: 4.24 MIL/uL (ref 4.22–5.81)
RDW: 12.6 % (ref 11.5–15.5)
WBC: 7.4 10*3/uL (ref 4.0–10.5)

## 2010-05-01 LAB — DIFFERENTIAL
Basophils Absolute: 0.1 10*3/uL (ref 0.0–0.1)
Basophils Relative: 1 % (ref 0–1)
Eosinophils Absolute: 0.2 10*3/uL (ref 0.0–0.7)
Eosinophils Relative: 3 % (ref 0–5)
Lymphocytes Relative: 32 % (ref 12–46)
Lymphs Abs: 2.4 10*3/uL (ref 0.7–4.0)
Monocytes Absolute: 0.8 10*3/uL (ref 0.1–1.0)
Monocytes Relative: 11 % (ref 3–12)
Neutro Abs: 3.9 10*3/uL (ref 1.7–7.7)
Neutrophils Relative %: 54 % (ref 43–77)

## 2010-05-01 LAB — URINE MICROSCOPIC-ADD ON

## 2010-05-01 LAB — BASIC METABOLIC PANEL
BUN: 23 mg/dL (ref 6–23)
CO2: 22 mEq/L (ref 19–32)
Calcium: 9.1 mg/dL (ref 8.4–10.5)
Chloride: 104 mEq/L (ref 96–112)
Creatinine, Ser: 1.9 mg/dL — ABNORMAL HIGH (ref 0.4–1.5)
GFR calc Af Amer: 44 mL/min — ABNORMAL LOW (ref >60)
GFR calc non Af Amer: 36 mL/min — ABNORMAL LOW (ref >60)
Glucose, Bld: 104 mg/dL — ABNORMAL HIGH (ref 70–99)
Potassium: 4.5 mEq/L (ref 3.5–5.1)
Sodium: 139 mEq/L (ref 135–145)

## 2010-05-01 LAB — URINALYSIS, ROUTINE W REFLEX MICROSCOPIC
Bilirubin Urine: NEGATIVE
Glucose, UA: NEGATIVE mg/dL
Ketones, ur: NEGATIVE mg/dL
Nitrite: POSITIVE — AB
Protein, ur: NEGATIVE mg/dL
Specific Gravity, Urine: 1.014 (ref 1.005–1.030)
Urobilinogen, UA: 0.2 mg/dL (ref 0.0–1.0)
pH: 7 (ref 5.0–8.0)

## 2010-05-01 LAB — URINE CULTURE
Colony Count: >=100000
Culture  Setup Time: 201108290006

## 2010-06-30 NOTE — Assessment & Plan Note (Signed)
Mark [name] is back regarding his peripheral neuropathy.  He had his  stimulator adjusted through Neurosurgery with not a lot of benefit.  Now, they are talking about changing his battery up.  He is really not  using it at this point and he is here today to discuss other options.  He is still on fentanyl 50 mcg q.72 hours.  His pain is 7-8/10.  He has  some new orthotic shoes, which have been very helpful as far as his  exercise tolerance.  He is sleeping fairly well.  He tried [place]  briefly, but could not tolerate due to nausea and other side effects.   REVIEW OF SYSTEMS:  Notable for bladder control problems, trouble  walking, dizziness, constipation, and nausea.  Full review is in the  written health and history section in the chart.   SOCIAL HISTORY:  The patient is married.   PHYSICAL EXAMINATION:  VITAL SIGNS:  Blood pressure is 105/57, pulse is  70, and respiratory rate 18.  He is sating 97% on room air.  GENERAL:  The patient is pleasant, alert, and oriented x3.  He seems to  walk without antalgia in his shoes today.  Strength is 4+ to 5/5  throughout.  Sensation decreased to pinprick and light touch, although  hypersensitive overall in distal lower extremities.  HEART:  Regular.  CHEST:  Clear.  ABDOMEN:  Soft and nontender.   ASSESSMENT:  1. 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  2. History of bladder cancer, status post bladder, prostate, ureter,      and appendix removal.  3. History of active urostomy.  4. Reactive depression.   PLAN:  1. We will send the patient to [facility] for a trail of conductive socks.  2. We will try analgesia cream of ketamine 4%, Elavil 4%, and 20%      ketoprofen.  3. The patient can talk with Dr. Clovis Riley regarding bumping his      fentanyl to 62.5 mcg.  4. Consider hyperbaric oxygen chamber treatment for lower extremities.      I am not sure if his insurance will cover this, however.  5. I will see him back in about a  year.      Ranelle Oyster, M.D.  Electronically Signed     ZTS/MedQ  D:  09/25/2007 12:18:01  T:  09/26/2007 04:08:31  Job #:  [id number]   cc:   L. Lupe Carney, M.D.  Fax: [phone]

## 2010-06-30 NOTE — Assessment & Plan Note (Signed)
Mark [name] is back regarding his peripheral neuropathy.  He has not seen a  lot of change over the last several months with his pain.  He rates it a  7/10.  He remains on the fentanyl patch 50 mcg q.72 h.  Pain is  described as sharp, stabbing, constant, aching.  Interferes with the  general activity, relations with others, and enjoyment of life on a  moderate-to-severe level.  Sleep is fair.  He is trying to wean down his  Lyrica at this point.  He remains on Cymbalta.  He is using hydrocodone  for breakthrough pain watching closely for effects of Tylenol,  confusion, etc.  He is a bit apprehensive.  He is using more than 2 or 3  time a day due to liver effects.   REVIEW OF SYSTEMS:  Notable for skin rash, shortness of breath,  constipation.  Other pertinent positives are above.  Full review is in  the written health and history section of the chart.   SOCIAL HISTORY:  The patient is married, lives with his wife.   PHYSICAL EXAMINATION:  Blood pressure is 109/67, pulse is 69,  respiratory rate is 18.  He is sating 98% on room air.  The patient is  pleasant, alert and oriented x3.  He is walking overall much better.  Strength remains generally 4-5/5 throughout with decreased pinprick and  light touch distally.  No focal color and temperature changes are noted.  Heart is regular.  Chest is clear.  Abdomen is soft, nontender.   ASSESSMENT:  1. 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  2. Bladder cancer status post bladder prostate, ureter, and appendix      removal.  3. History of active urostomy.  4. Reactive depression.   PLAN:  1. We will try Nucynta 50 mg q.8 h p.r.n.  2. The patient can talk with his family physician regarding fentanyl      increase.  We discussed previously going up to 62.5 mcg which is an      option.  3. Continue activity to tolerance.  4. We will hold off on hydrocodone while using Nucynta certainly.  5. He is to call me back with any questions or  concerns going forward.      Ranelle Oyster, M.D.  Electronically Signed     ZTS/MedQ  D:  05/27/2008 13:22:13  T:  05/28/2008 03:16:08  Job #:  [id number]   cc:   L. Lupe Carney, M.D.  Fax: [phone]

## 2010-06-30 NOTE — Assessment & Plan Note (Signed)
Today, Mark [name] is back regarding his peripheral neuropathy.  He did not  notice much change with Nucynta.  He does notice on day #3 of his  fentanyl patch, but his pain does increase quite a bit.  Pain is 7-8/10  today.  Pain interferes with general activity, relations with others,  enjoyment of life on a moderate-to-severe level.  Sleep is fair.   REVIEW OF SYSTEMS:  Notable for weight loss and constipation.  Other  pertinent positives are above and full review is in the written health  and history section of the chart.   SOCIAL HISTORY:  The patient is married, lives with his wife.   PHYSICAL EXAMINATION:  VITAL SIGNS:  Blood pressure is 117/62, pulse is  93, respiratory rate is 16, sating 96% on room air.  EXTREMITIES:  Gait is slightly wide base.  Strength is unchanged at 4-  5/5 throughout.  Sensation is decreased distally in a stocking-glove  distribution.  HEART:  Regular.  CHEST:  Clear.  ABDOMEN:  Soft, nontender.   ASSESSMENT:  1. 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  2. Bladder cancer status post bladder, prostrate, ureter, and appendix      removal.  3. History of active urostomy.  4. Reactive depression.   PLAN:  1. I think the patient could benefit from a trial of his fentanyl      patch with q. 48-hour interval.  He is currently on a 50 mcg dose.      I wrote a note to Dr. Clovis Riley in  regard to this.  I am happy to      speak with him also.  I have several patients with similar      diagnosis who have done well on a 48-hour patch schedule.  2. He can continue with Vicodin for breakthrough pain.  3. Consider increasing the Nucynta  (100 mg) for another trial.  4. We will see him back in about 4 months.      Ranelle Oyster, M.D.  Electronically Signed     ZTS/MedQ  D:  08/27/2008 11:22:44  T:  08/28/2008 04:40:18  Job #:  [id number]   cc:   L. Lupe Carney, M.D.  Fax: [phone]

## 2010-06-30 NOTE — Assessment & Plan Note (Signed)
Avinash is back regarding his chronic peripheral neuropathy.  He comes  back today with concerns about the spinal stimulator.  He feels that it  is not working as well as it once was.  He has never had full relief  through the feet.  He feels that it is moving his back and uncomfortable  when he lies down specifically.  He wants to discuss removal with Dr.  Venetia Maxon.  He also is asking about any other medications that are  available.  He rates his pain a 7/10, described it as burning, sharp  stabbing and constant.  Pain interferes with general activity,  relationships with others and enjoyment of life on a moderate to severe  level.   REVIEW OF SYSTEMS:  Notable for trouble walking, dizziness, diarrhea,  constipation, occasional anxiety, confusion, bowel and bladder issues  which are well documented.   SOCIAL HISTORY:  Without change.  He lives with his wife.   PHYSICAL EXAM:  Blood pressure is 127/88, pulse is 64, respiratory rate  18, he is sating 100% on room air.  The patient is pleasant, alert and  oriented x3.  Affect is bright and appropriate.  Skin is stable.  He  remains sensitive in the lower extremities with decreased pinprick and  light touch.  Motor function is 4+ to 5/5 throughout.  Gait is generally  stable with mild antalgia on either leg noted.  HEART:  Regular.  CHEST:  Clear.  ABDOMEN:  Soft, nontender.  COGNITIVELY:  He is within functional limits.  CRANIAL NERVE EXAM:  Normal.   ASSESSMENT:  1. 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  2. History of bladder cancer status post bladder, prostate, ureter and      appendix removal.  3. History of active urostomy.  4. Reactive depression.   PLAN:  1. Will make a referral to Dr. Venetia Maxon to re-evaluate his stimulator.      Jacion seems pretty determined to have it removed, but I think he      should discuss potential adjustments and other considerations with      Dr. Venetia Maxon.  2. Continue medications per  Dr. Clovis Riley including his fentanyl,      Lyrica, Cymbalta, Ultram, etc.  3. Will write a prescription for nortriptyline 25 mg, 1-2 q.h.s., #60.  4. Topical cream as needed for pain relief.  5. I will see Clearance back in the future as needed.  We did discuss      potentially using Tofranil-PM as well for pain.      Ranelle Oyster, M.D.  Electronically Signed     ZTS/MedQ  D:  08/30/2006 09:47:17  T:  08/30/2006 15:25:27  Job #:  [id number]   cc:   L. Lupe Carney, M.D.  Fax: [phone]

## 2010-07-03 NOTE — Op Note (Signed)
NAME:  Mark [name]                         ACCOUNT NO.:  [id number]   MEDICAL RECORD NO.:  [id number]                   PATIENT TYPE:  OIB   LOCATION:  3172                                 FACILITY:  [facility]   PHYSICIAN:  Danae Orleans. Venetia Maxon, M.D.               DATE OF BIRTH:  04-27-1950   DATE OF PROCEDURE:  11/06/2002  DATE OF DISCHARGE:                                 OPERATIVE REPORT   PREOPERATIVE DIAGNOSIS:  Bilateral lower extremity pain secondary to  peripheral neuropathy secondary to chemotherapy for metastatic colon cancer.  The patient had prior successful percutaneous spinal cord stimulator trial.   POSTOPERATIVE DIAGNOSIS:  Bilateral lower extremity pain secondary to  peripheral neuropathy secondary to chemotherapy for metastatic colon cancer.  The patient had prior successful percutaneous spinal cord stimulator trial.   OPERATION PERFORMED:  Thoracic laminectomy and placement of permanent spinal  cord stimulator with implantable pulse generator under fluoroscopic  visualization.   SURGEON:  Danae Orleans. Venetia Maxon, M.D.   ANESTHESIA:  General endotracheal.   ESTIMATED BLOOD LOSS:  Minimal.   COMPLICATIONS:  None.   DISPOSITION:  Recovery.   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  He has been requiring high dose narcotics  for relief.  He had a trial of a percutaneous stimulator which was helpful  at eliminating his foot pain.  It was elected to bring him to surgery for  placement of permanent spinal cord stimulator with implantable pulse  generator.   DESCRIPTION OF PROCEDURE:  Mark [name] was brought to the operating room.  Following the satisfactory and uncomplicated induction of general  endotracheal anesthesia and placement of intravenous lines, the patient was  placed in the prone position on the operating table on chest rolls.   His  midback was then prepped and draped in the usual sterile fashion.  Using  fluoroscopy prior to prepping, the skin overlying the T10 spinous process  and vertebra was marked.  The patient's back was then prepped and draped in  the usual sterile fashion.  The area of planned incision was infiltrated  with 0.25% Marcaine and 0.5% lidocaine with 1:200,000 epinephrine.  An  incision was made in the midline overlying the T10 level through the  subcutaneous tissue to the thoracic fascia which was exposed.  An incision  was made overlying the right side of the thoracic fascia and subperiosteal  dissection was performed exposing the T10 level.  A hemilaminectomy at this  level was performed after confirming correct level on intraoperative  fluoroscopy.  The ligamentum flavum was detached and removed in piecemeal  fashion.  The spinal cord stimulating electrode was initially fed into the  epidural space cephalad but it was felt that the electrodes were going to be  too high and it consequently was turned and fed caudad  with electrode  placement over T11 and 10 levels.  Paddle electrode positioning was then  confirmed with AP fluoroscopy.  The self-retaining retractor was removed and  the fascia was closed with interrupted Vicryl sutures.  The anchors were  placed and sutured to the fascia.  A right-sided incision was made just  below the belt line overlying the buttock and subcutaneous pocket was  created.  The tunneler was then used to go from the back incision to the  subcutaneous pocket and 51 cm electrode extensions were tunneled.  This was  then hooked up to the implantable pulse generator battery.  The extensions  were connected to the stimulating electrodes and suture boots were placed  overlying these and anchored in position with 2-0 silk ties.  The redundant  extension was then circularized beneath the implantable pulse generator and  then placed in the subcutaneous pocket under the  generator.  All connections  were secured appropriately.  The wounds were then copiously irrigated with  bacitracin saline, closed with interrupted 2-0 Vicryl and 3-0 Vicryl  sutures.  The wounds were dressed with benzoin and Steri-Strips, Telfa gauze  and tape.  The patient was extubated in the operating room and taken to the  recovery room in stable and satisfactory condition having tolerated the  operation well.  Counts were correct at the end of the case.                                                Danae Orleans. Venetia Maxon, M.D.    JDS/MEDQ  D:  11/06/2002  T:  11/06/2002  Job:  [id number]

## 2010-07-03 NOTE — Assessment & Plan Note (Signed)
MEDICAL RECORD NUMBER:  [id number]   HISTORY OF PRESENT ILLNESS:  Mark [name] is back regarding his chronic pain  syndrome.  He had a long battle with his bladder and eventually required  removal of his bladder, prostate and part of his ureters and appendix due to  adhesions from chemotherapy.  He almost died as a result of complications  and renal failure, but has emerged and is doing fairly well at this point.  He does have a urostomy in place which he is learning to deal with both  physically and emotionally.  He feels better than he has for some time.  He  rates his pain at a 5-6/10.  Pain is most predominant in the legs and feet  at this point.  The pain interferes with general activity, relationship with  others, enjoyment of life on a moderate level.  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  Pain increases with walking, standing and often at  nighttime when he sits still for too long.  His feet do better when he gets  off of them for a period of time.   REVIEW OF SYSTEMS:  Pertinent positive as mentioned above.  The patient  reports some dizziness and spasm as well as depression, anxiety, weight  loss, constipation and abdominal pain.  Constipation seems to be related to  the piece of colon they used for his urostomy conduit.  He is on a regular  schedule of bowel medications being MiraLax and Dulcolax, but does not use  anything from below _________for bowel movements.   MEDICATIONS:  1.  Fentanyl patch 50 mcg q.72 h.  2.  Lyrica 50 mg b.i.d.  He was cleared for this by his nephrologist.  3.  Ultram 50 mg daily, p.r.n..  4.  Cymbalta 60 mg b.i.d.  5.  Restoril 30 mg q.h.s.  6.  Ativan 1 mg q.h.s.   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  His  wife remains very supportive.   PHYSICAL EXAMINATION:  VITAL SIGNS:  Blood pressure 118/80, pulse 76,  respirations 16, saturating 99% on room air.  GENERAL APPEARANCE:  The patient is pleasant, in no  acute distress.  Has  lost noticeable weight, but appears to be healthy and has good color.  He is  alert and oriented x3.  Affect is bright and appropriate.  Gait is slightly  shuffling, particularly on the right side.  He did have good motor function  in both lower extremities with 4+-5/5.  Sensation was decreased at 1/2 in  both legs, right slightly more than left.  Reflexes are 1+.  Cognition is  good.  HEART:  Regular rate and rhythm.  LUNGS:  Clear.  ABDOMEN:  Soft, nontender.  Urostomy bag in place and seems to be draining  appropriately.  No skin erythema or irritation was noted.   ASSESSMENT:  1.  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  2.  History of bladder cancer status post removal of bladder, prostate,      ureter, partial ureters and appendix.  3.  Urostomy.  4.  Reactive depression.   PLAN:  1.  Would like to get the patient involved in Anodyne treatments to work on      his neuropathic leg pain.  He should have no contraindications to this.      I expect him to have at least a partial to excellent response with these      treatments.  He will see physical therapy for this as well as address      gait issues with them.  2.  Continue Lyrica 50 mg b.i.d.  Seems to be tolerating this pretty well.      His most recent creatinine was 1.9.  I wonder if we could go up a bit      higher with this.  3.  The Cymbalta current dose was 60 mg b.i.d.  4.  Will see the patient back in about two months time.      Ranelle Oyster, M.D.  Electronically Signed     ZTS/MedQ  D:  11/25/2004 11:06:36  T:  11/25/2004 11:41:36  Job #:  [id number]   cc:   Valentino Hue. Magrinat, M.D.  Fax: [phone]

## 2010-07-03 NOTE — Discharge Summary (Signed)
NAME:  Mark [name], Mark [name]                         ACCOUNT NO.:  [id number]   MEDICAL RECORD NO.:  [id number]                   PATIENT TYPE:  INP   LOCATION:  0357                                 FACILITY:  [facility]   PHYSICIAN:  Sigmund I. Patsi Sears, M.D.         DATE OF BIRTH:  30-Apr-1950   DATE OF ADMISSION:  05/13/2003  DATE OF DISCHARGE:  05/17/2003                                 DISCHARGE SUMMARY   DISCHARGE DIAGNOSES:  Left lower quadrant pain, status post transurethral  resection of bladder tumor approximately 10 days ago.   HISTORY OF PRESENT ILLNESS:  Mr. Shirk is a 60 year old married white male  who is an established patient of Dr. Patsi Sears.  He was evaluated in our  office on April 11, 2003 for complaints of painless gross hematuria.  Cystoscopy was accomplished and showed that he had a normal-appearing  bladder; however, he had a positive NMP22 on his urinalysis.  After a short  two-week followup, another NMP22 was obtained on the patient's urine, and it  was also positive.  Dr. Patsi Sears felt that it was necessary to take the  patient to the operating room and further explore his bladder with  cystoscopy, urethral dilation, bilateral retrograde pyelogram, and bladder  biopsies.  He was taken to the OR on May 06, 2003, where he was found to  have two small bladder tumors.  These bladder tumors were resected, and  mitomycin was instilled into the patient's bladder.  The patient was sent  home that day.  The patient returned for subsequent appointments to the  office to review his pathology with Dr. Patsi Sears.  On May 13, 2003, the  patient returned to the office, complaining of general malaise and left  lower quadrant pain.  He was admitted to the hospital for GI consult and  observation.   PAST MEDICAL HISTORY:  1. Patient has a history of colon cancer with one positive node, status post     chemotherapy with Dr. Darnelle Catalan in 2001.  2. History of gallbladder  removal in 1988.  3. Neuropathy secondary to chemotherapy secondary to his colon cancer.  4. History of irritable bowel syndrome.   HOSPITAL COURSE:  Patient was admitted to the hospital and was consulted by  Dr. Russella Dar of GI.  He was started on Cipro and Flagyl for his pain and loose  stools.  The patient's pain continued to worsen, and CT showed that he had  some continued mild left hydronephrosis.  Dr. Patsi Sears took the patient  back to the OR where a cystogram as well as cysto and bilateral retrograde  pyelograms were performed.  A left double-J catheter was placed, and GI  continued to follow.  He was then placed on steroids and started progressing  his diet slowly.  Patient was continuing to have problems with bowel  movement.  His diet slowly progressed.  On hospital day #4, the patient's  left lower quadrant pain  was significantly alleviated, and he was having  solid bowel movements.  He was to be sent home on Cipro and Flagyl per GI  and followed up in the office with Dr. Patsi Sears as well as Dr. Russella Dar.   DISCHARGE MEDICATIONS:  1. Cipro.  2. Flagyl.  3. Darvocet.  4. Urocit.   PLAN:  Patient was discharged to home with instructions to eat solid food  slowly and avoid milk products.  He was returned to see Dr. Patsi Sears in  one week as well as Dr. Russella Dar in approximately three weeks.   CONDITION ON DISCHARGE:  Stable.     Terri Piedra, N.P.                         Sigmund I. Patsi Sears, M.D.    HB/MEDQ  D:  09/11/2003  T:  09/11/2003  Job:  [id number]

## 2010-07-03 NOTE — Discharge Summary (Signed)
NAME:  Mark [name], Mark [name]                         ACCOUNT NO.:  [id number]   MEDICAL RECORD NO.:  [id number]                   PATIENT TYPE:  INP   LOCATION:  0349                                 FACILITY:  [facility]   PHYSICIAN:  Mark [name], M.D.         DATE OF BIRTH:  08-13-1950   DATE OF ADMISSION:  05/31/2003  DATE OF DISCHARGE:  06/07/2003                                 DISCHARGE SUMMARY   HISTORY OF PRESENT ILLNESS:  Mark [name] is a 60 year old white male who is well  known to Dr. Patsi [name] for a newly diagnosed superficial bladder cancer  approximately one month ago. Following bladder biopsies on an outpatient  basis and installation of mitomycin into his bladder, the patient started to  complain of lower left quadrant and problems with his bowels about ten days  status post procedure. He has been admitted to the hospital on a couple of  occasions since the initial biopsies. GI has been following him with [place] as  he is well known to Dr. Russella [name] for a history of colon cancer which he was  diagnosed in 2001. He underwent a sigmoid colectomy with Dr. Johna [name] and  was found to have one of eleven lymph nodes positive. He subsequently  underwent chemotherapy with Dr. Darnelle [name]. He had follow-up colonoscopy in  June 2003 with finding of a nodule versus polyp at the  anastomosis and then  several small sigmoid colon polyps, which were removed. Biopsy from the  anastomosis showed a benign lymphoid nodule. The patient was due for follow-  up colonoscopy in June 2005.  He was evaluated by Dr. Patsi [name] initially  for the gross hematuria which led to the bladder biopsies. Repeat CT scan  after the first re-admission to the hospital showed some left hydronephrosis  and eventually had placed a double-J stent. The patient kept the double-J  stent for approximately two weeks and returned to the office to have it  removed. Initially after the removal the patient felt better, and then the  next  day the patient's wife called complaining that he was still not doing  well, continuing to have the left lower quadrant pain especially when he ate  solid foods. Another CT scan was obtained with rectal contrast to evaluate  his anastomosis site. He was noted to have some mucosal thickening of the  distal, descending, and proximal sigmoid colon at the site of the  anastomosis from the site of the partial resection secondary to colon  carcinoma. The radiologist noted that these changes could all be  inflammatory in nature and he also had thick walled urinary bladder without  unusual further recent diagnosis of bladder carcinoma. Mark [name] presented  again to the hospital on May 31, 2003, for continuing left lower quadrant  pain and intermittent low-grade temperature. He described the pain as  constant, gassy, and a pressure-like sensation with bloating. He was  admitted per GI. The patient was again worked  up with a flexible  sigmoidoscopy. The patient was noted to have a normal post colectomy, left  colon. Some mild stenosis of the  anastomotic area, but no colonic processes  were found. The patient was also on antibiotics per GI. At that point Dr.  Patsi [name] was again involved in the patient's care and patient was turned  over to Dr. Imelda [name] service, still with GI following, and also Dr.  Darnelle [name] following the patient.   PAST MEDICAL HISTORY:  1. As outlined above in the history of present illness.  2. IBS.  3. Head injury at age 51.  4. Basal cell carcinoma of the face.   HOSPITAL COURSE:  Following the flexible sigmoidoscopy, Dr. Patsi [name] as  well as GI continued to follow the patient. Steroids were introduced to the  patient and the patient began to feel better. Over the course of the  hospital stay he has had problems with urinary frequency and urgency, and  has been started on Ditropan XL as well as Urised which has relieved his  symptoms. He has also complained of  some scrotal discomfort and a scrotal  ultrasound was obtained which was normal. GI had started him on some MiraLax  as well as Zelnorm for his chronic constipation and ileus. We would like for  him to avoid narcotics as much as possible to help resolve this problem. The  patient will be discharged this morning home on steroids as well as Zelnorm  and MiraLax. We will also give him Ditropan XL as well as Prosed for his  urinary frequency and urgency. The patient will management with his pain  management doctor, Dr. Riley [name], as well as Dr. Russella [name] for GI as well as Dr.  Patsi [name] for his urology issues.   CONDITION ON DISCHARGE:  Stable.   DISCHARGE MEDICATIONS:  1. Ditropan XL 5 mg.  2. Percocet p.r.n. for burning.  3. Zelnorm 6 mg two times a day.  4. MiraLax 17 gm daily.   PLAN:  The patient will follow up with the above outlined physicians and  call Dr. Patsi [name] if any problems over the next week.     Mark [name], N.P.                         Mark [name], M.D.    Mark [name]  D:  06/07/2003  T:  06/07/2003  Job:  [id number]   cc:   Mark [name], M.D.  [address] [place]  [place] [address]  Fax: [phone]   Mark [name], M.D. [facility]   Mark [name], M.D.  [address]  [place]  [place] [address]  Fax: [phone]

## 2010-07-03 NOTE — Assessment & Plan Note (Signed)
MEDICAL RECORD NUMBER:  [id number].   DATE OF BIRTH:  1951-01-14.   HISTORY OF PRESENT ILLNESS:  [place] is here in followup of his peripheral  neuropathy pain.  He had a hard time tolerating __________ at the increased  dosing when we got to 150 mg b.i.d.  He did well with the 50 mg t.i.d.  His  wife notes he has had dry mouth.  He is having a hard time sleeping at night  due to multiple urinations.  He is having to urinate 10-15 times a night.  He is on Ditropan for urinary frequency.  He was found to have an enlarged  prostate and will be undergoing a biopsy of this in the next few days.  His  Ultracet is taken one q.6h. p.r.n. and Duragesic patches at 75 mcg/hr every  72 hours.  The Restoril helps him get to sleep initially as does the Ativan  at bedtime.  The Lidoderm patches are moderately helpful.  Cymbalta effects  have plateaued.   The patient has concerns over his medical issues, as well as how he is  tolerating all of these problems in the context of continuing his work.  He  is contemplating applying for disability, although he does not want to.  He  has a component of depression.  He is not receiving any counseling at this  point.  He is receiving psychiatric followup through Dr. Jennelle Human.  His wife  notes that he is sleeping a lot.  He likes to likes to stay in the bed much  of the day.  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   SOCIAL HISTORY:  Recent update as mentioned above in the HPI.   REVIEW OF SYSTEMS:  The patient notes blurred vision, anxiety, depression,  headaches, dizziness, spasms, nausea, vomiting, reflux and bladder  incontinence, as well as rash.   PHYSICAL EXAMINATION:  The blood pressure is 152/80, the pulse is 73, the  respiratory rate is 16 and he is saturating 95% on room air.  The patient  walks with a limp.  His affect is bright and alert.  Appearance is normal.  He did have some problems formulating thoughts and with recall and attention  today.  Strength exam is stable.  He remains hypersensitive to touch in the  lower extremities.  No new weaknesses were noted there nor skin changes.   ASSESSMENT:  1.  Painful peripheral neuropathy.  2.  History of bladder cancer, status post surgical resection and      chemotherapy.  3.  Prostate enlargement.  4.  Reactive depression.   PLAN:  1.  Due to some of the sedation he is experiencing, we will decrease his      fentanyl patch to 50 mcg q.72h.  2.  We will restart the __________ 50 mg t.i.d. and then increase to q.i.d.      if possible after two weeks' time.  3.  Continue with Cymbalta as dosed.  4.  Recommended holding the Ditropan to see if this decreases his urinary      frequency.  He may actually be having retention, especially considering      the fact that his prostate is enlarged and the Ditropan will only serve      to worsen that.  5.  The patient will follow up regarding his prostate here in the new few  days.  6.  I recommend psychological counseling at this point to help with the pain      and mood-related issues.  I do not think that psychiatry is enough on      its own.  The patient would like to defer at this point on that and I      respect his wishes there.  7.  The patient may use Ultram on a p.r.n. basis for breakthrough symptoms.  8.  I will see the patient back in about one month's time.       ZTS/MedQ  D:  01/01/2004 12:41:25  T:  01/01/2004 16:09:45  Job #:  [id number]   cc:   Valentino Hue. Magrinat, M.D.  [address] [facility]  [place]  [place] [address]  Fax: [phone]   Sigmund I. Patsi Sears, M.D.  [address]  [place]  [place] [address]  Fax: [phone]

## 2010-07-03 NOTE — Consult Note (Signed)
NAME:  Mark [name], Mark [name] NO.:  [id number]   MEDICAL RECORD NO.:  [id number]                   PATIENT TYPE:   LOCATION:                                       FACILITY:   PHYSICIAN:  Sondra Come, D.O.                 DATE OF BIRTH:   DATE OF CONSULTATION:  DATE OF DISCHARGE:                                   CONSULTATION   REASON FOR CONSULTATION:  The patient returns to clinic today for  reevaluation.  He was last seen on November 09, 2001.  He seems to be doing  much better with methadone 5 mg two to three times per day in terms of his  neuropathic foot pain.  His pain today  is rated at a 7/10 on a subjective  scale.  His function and quality-of-life indices have improved.  His sleep  has improved as well.  He still continues to take lorazepam or Ambien at  bedtime.  We discussed further treatment options.  This was a 15-minute  consultation, discussing medication in detail.  The patient denies any new  neurologic complaints.  He continues to have a cold burning sensation in his  feet bilaterally.  He also states that he has been exercising more regularly  and feels better in that regard.  I reviewed the health and history form and  14-point review of systems.   PHYSICAL EXAMINATION:  GENERAL:  Physical exam reveals a healthy male in no  acute distress.  VITAL SIGNS:  Blood pressure 141/87, pulse 101, respirations 18, O2  saturation 98% on room air.   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   PLAN:  1. Again, the patient and I had a thorough discussion of his current     medications and additional medications to use adjunctively for his     neuropathic pain.  This was a 15-minute consultation.  The patient was     instructed to continue with methadone 5 mg one p.o. b.i.d. to t.i.d.; I     did renew his prescription for #90 to be filled on December 09, 2001 or     after without refills.  2. We discussed adjunctive  medications and we will begin Pamelor 10 mg one     to two p.o. q.h.s. as needed, #60 with one refill.  I instructed the     patient to start with one pill at bedtime for approximately one week.  He     can increase to two pills at bedtime as tolerated in one week.  3. We will try to wean from lorazepam.  He can continue with Ambien as     needed.  4. Patient to continue with his exercise program.  5. Patient to return to clinic in five weeks for reevaluation or sooner as     needed.   Patient was educated in the above findings  and recommendations and  understands.  No barriers to communication.                                                Sondra Come, D.O.   JJW/MEDQ  D:  11/30/2001  T:  12/02/2001  Job:  [id number]   cc:   Valentino Hue. Magrinat, M.D.  [address] [facility]  [place]  [place] [address]  Fax: [phone]

## 2010-07-03 NOTE — H&P (Signed)
NAME:  Mark [name], Mark [name]                         ACCOUNT NO.:  [id number]   MEDICAL RECORD NO.:  [id number]                   PATIENT TYPE:  INP   LOCATION:  0357                                 FACILITY:  [facility]   PHYSICIAN:  Sigmund I. Patsi Sears, M.D.         DATE OF BIRTH:  [birth year]   DATE OF ADMISSION:  05/13/2003  DATE OF DISCHARGE:                                HISTORY & PHYSICAL   HISTORY OF PRESENT ILLNESS:  Mark [name] is a 60 year old white married male,  who is an established patient of Dr. Imelda Pillow.  He was seen in our  office on April 11, 2003, for complaints of painless gross hematuria.  A  cystoscopy was accomplished in the office and showed that he had normal-  appearing bladder.  However, he had a positive NMP 22 on his urinalysis.  The patient was status post a CT scan by Dr. Marikay Alar Magrinat to evaluate his  history of colon cancer.  CT scan reported no abnormalities of the kidneys  and no evidence of stones.  Dr. Patsi Sears had the patient return in  approximately 2 weeks for follow-up and reevaluation of his gross hematuria.  The patient states that he had an on again and off again episodes of gross  hematuria.  Another NMP 22 was obtained and remained positive.  Given two  positive NMP 22's, Dr. Patsi Sears felt necessary to take the patient to the  operating room and further explore his bladder with cystoscopy, urethral  dilation, bilateral retrograde pyelograms, and evaluation.  The patient was  taken to the OR on May 06, 2003, where he was found to have two small  bladder tumors.  These bladder tumors were resected and patient was sent  home that day.  The patient returned the next week to go over pathology with  Dr. Patsi Sears and was doing well.  He had no urinary complaints at that  time and stated that he was feeling okay.  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  The patient discussed treatment  options with Dr.  Patsi Sears which included BCG with Intron treatments here  in our office.  This was reviewed with the patient on May 08, 2003.  On  May 10, 2003, the patient presented to the emergency room with intense  lower left quadrant pain.  He stated that he was having a hard time having a  bowel movement and the left lower quadrant pain had come on suddenly.  Dr.  Patsi Sears admitted the patient to the hospital for hydration and further  exploration.  CT scan of the abdomen and pelvis was obtained with oral  contrast only.  At that time, he was noted to have an area of edema just  outside the bladder as well as some mild hydronephrosis on the left.  The  patient was admitted for hydration and evaluation.  He was given cathartics  and was discharged the next day.  He had had several bowel movements over  the night and was feeling much better.  He was discharged to home on  Saturday morning and was feeling better.  When he got home Saturday morning,  he had breakfast and since late Saturday afternoon and most of the day  Sunday his pain in the left lower quadrant has returned.  Dr. Patsi Sears has  been in the touch with the patient over the weekend and had him come back to  the office on Monday, May 13, 2003.  Contrasted IV and oral contrasted CT  of the abdomen and pelvis.  These films were taken to [facility]  and reviewed with radiologist.  His hydronephrosis has resolved, and his  pelvic edema is resolving.  Radiologist thoughts were that the pain could be  coming from some diverticulitis or just a small hematoma outside of the  bladder due to the bladder biopsies.  CT's were reviewed with the patient  and his wife and given the amount of pain that he is in, the patient  preferred to be admitted to [facility] and treated with IV Cipro  for possible diverticulitis as well as maintenance to help clean his bowels  out.  The patient states that he is a known GI patient of Dr.  Claudette Head,  and we have consulted [facility], and they will evaluate him while he is  also in the hospital.  The patient was given 16 mg IM of Toradol in the  office to help with his pain.   PAST MEDICAL HISTORY:  The patient has history of colon cancer with one  positive node, status post chemotherapy with Dr. Darnelle Catalan in 2001.  He also  has a prior history of gallbladder removal in 1988.  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  He also has a  history of irritable bowel syndrome.   MEDICATIONS:  1. Cymbalta 60 mg 1 p.o. daily.  2. Duragesic patch 75 mg q.72h. for his neuropathy.  3. Ambien 10 mg p.o. q.h.s., and he has recently been taking stool softeners     and pain medication after his bladder biopsy procedure.   ALLERGIES:  PENICILLIN.   SOCIAL HISTORY:  The patient is a Optician, dispensing.  He is married and has two  children, ages 14 and 81.  He denies any smoking or alcohol use.   REVIEW OF SYSTEMS:  Significant for left lower quadrant pain and  constipation.   PHYSICAL EXAMINATION:  VITAL SIGNS:  Temperature today is 99.1, pulse is  130, blood pressure 140/91.  GENERAL:  Mark [name] is a 60 year old male in mild distress.  HEENT:  Neck is supple.  No lymphadenopathy noted.  CARDIAC:  A little tachycardic today but regular.  LUNGS:  Clear to auscultation bilaterally.  ABDOMEN:  Positive bowel sounds.  The patient has significant tenderness on  exam in the left lower quadrant.  RECTAL:  A rectal exam was performed today, and Dr. Patsi Sears felt no stool  impaction in the patient's rectal vault.  EXTREMITIES:  No edema noted.   LABORATORY DATA:  The patient had urinalysis as well as CBC on Friday, May 10, 2003, on admission to the hospital.  His urinalysis was clear, and his  white blood cell count at that time was within normal limits.  CT scan of the abdomen and pelvis was done on Friday.  Results were  mentioned in the HPI as  well as  the CT scan that was performed on May 13, 2003, in Dr. Imelda Pillow office.  Those films have been sent out for over-  read by the radiologist.   ASSESSMENT:  Left lower quadrant pain - questionable diverticulitis versus  hematoma from bladder biopsies.   PLAN:  Dr. Patsi Sears is going to admit Mark [name] to the hospital tonight.  We  will start IV Cipro and give him medication to help clear out his bowels.  We will also control his left lower quadrant pain with Toradol as well as  some p.o. Darvocet; however, we are still concerned about his constipation  due to the pain medications.  We have consulted GI, and they will see him  either this evening or tomorrow and help [place] evaluate him to see if this is  indeed a diverticulitis.     Terri Piedra, N.P.                         Sigmund I. Patsi Sears, M.D.    Suella Grove  D:  05/13/2003  T:  05/13/2003  Job:  [id number]

## 2010-07-03 NOTE — Consult Note (Signed)
[place]  Patient:    Mark [name], Mark [name]                      MRN: [id number] Proc. Date: 09/11/99 Adm. Date:  [id number] Disc. Date: [id number] Attending:  Glenna Fellows Tappan Dictator:   Larey Dresser, N.P. CC:         Lorne Skeens. Johna Sheriff, M.D.; Valentino Hue. Magrinat, M.D.                          Consultation Report  REASON FOR CONSULTATION:  Newly diagnosed colon cancer.  HISTORY OF PRESENT ILLNESS:  [name] is a 60 year old male who has been relatively healthy except for occasional occurrences of diarrhea and constipation which are associated with irritable bowel syndrome.  Earlier this month, however, patient began experiencing some fatigue and "tailbone" pain, and at his wifes request presented to Dr. Raynelle Dick office, with a request for a colonoscopy.  The patient underwent a colonoscopy on August 24, 1999, with the final pathology report ([id number]) revealing a colonic-type adenocarcinoma.  The patient was then referred to Dr. Johna Sheriff and underwent a sigmoid colectomy on September 09, 1999.  The final surgical pathology revealed an invasive, moderately-differentiated adenocarcinoma extending into the muscularis propria ([id number]).  The margins were negative and 1 of 11 lymph nodes was found to be positive.  PAST MEDICAL HISTORY:  1. History of chronic prostatitis.  2. GERD by EGD.  3. History of shingles in 1999.  4. Hiatal hernia.  5. Status post cholecystectomy in 1998.  6. History of a severe head injury at age 89.  7. History of irritable bowel syndrome.  8. History of basal cell carcinoma on the face.  CURRENT MEDICATIONS:  Toradol, morphine PCA, Pepcid 20 mg IV q.12h., Phenergan p.r.n.  HOME MEDICATIONS:  Prilosec.  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  FAMILY HISTORY:  Mother deceased, age 8, of CHF.  Father  deceased, age 19, COPD.  Sister alive, age 34, with breast cancer, status post mastectomy, chemotherapy.  Brother alive, age 48, healthy.  Sister deceased in infancy.  SOCIAL HISTORY:  The patient lives in [place] with his wife.  This is his second marriage.  He has two children from a previous marriage.  [name], his daughter, is age 52 and lives in [place], [place] where she is employed by the [employer].  She is adopted.  [name], his son, age 55, lives in [place], [place] where he is in the [employer].  The patient is currently employed as a Optician, dispensing at the [employer].  Prior to entering the ministry, the patient was a Armed forces logistics/support/administrative officer.  His emergency contact number is his wife, Jonell Cluck, and her pager number is [phone] and home number is [phone].  HEALTH MAINTENANCE:  1. Prostate/PSA checked in 2001 and was within normal limits.  2. Colonoscopy, 2001.  3. EGD, 2001 (the patient participated in a "Nexium study" and reports he was     found to have "lesions" in his esophagus prior to the study.  After taking     the medication for four weeks the EGD was repeated and no lesions were     identified.  The patient is currently on Prilosec.  4. Tobacco - the patient has a remote history of pipe tobacco use over a span  of approximately 4 years.  5. ETOH:  None.  6. Flu shot:  None.  7. Pneumovax:  None.  8. Blood transfusions:  None.  9. Living will:  The patient does not have a living will, but reports that he     would like the information. 10. Health care Power of Attorney:  The patient reports his health care Power     of Gerrit Friends is his wife, Jonell Cluck, and that they have the paper work.  REVIEW OF SYSTEMS:  General:  The patient reports a 15-pound weight loss since June which he reports was intentional.  He denies any fever or chills.  He does report occasional night sweats.  His appetite has been diminished and he has been fatigued  recently.  He has had no headaches or vision changes.  He does report decreased hearing in the left ear.  He denies any mouth sores.  He reports some dental x-rays taken recently revealed a questionable cyst in his jaw area.  He has had no neck pain or enlarged glands.  He denies being short of breath or having any cough.  He has had no chest pain, but does have occasional ankle edema.  He does report a history of irritable bowel syndrome, with alternating constipation and diarrhea.  He has had no rectal bleeding or melena.  He denies nausea, vomiting, or abdominal pain.  He does have a history of chronic prostatitis although not recently.  He denies any hematuria or dysuria.  He is followed by Dr. Patsi Sears for this.  He reports no musculoskeletal complaints except for the recent onset of "tailbone" pain.  He does report occasional numbness to his feet.  He denies any double or blurred vision.  He denies any bruising or bleeding.  PHYSICAL EXAMINATION:  VITAL SIGNS:  Temperature 98.4, heart rate 79, respirations 16, blood pressure 141/75.  Height is 67 inches.  Weight 180 pounds.  GENERAL:  This is a well-nourished Caucasian male in no acute distress.  HEENT:  Normocephalic, atraumatic.  Pupils are equal, round, and reactive to light; extraocular movements are intact; sclerae are anicteric.  Oropharynx is clear.  LYMPH NODES:  No palpable lymph nodes.  LUNGS:  Diminished at the bases, but clear.  No wheezes or rales noted.  CARDIOVASCULAR:  Regular rate and rhythm with no murmur.  There is no JVD.  ABDOMEN:  Soft and slightly tender; there is a midline abdominal incision intact with staples.  Bowel sounds are diminished.  Abdomen is grossly benign.  EXTREMITIES:  Without clubbing, cyanosis or edema.  NEUROLOGIC:  He is alert and oriented x 3.  DTRs are 2+ and symmetrical and muscle strength is 5.5.  SKIN:  Without jaundice.  LABORATORY DATA:  Obtained September 04, 1999 -  Hemoglobin 16.4, white count 8.0, platelets 259,000, MCV 81.0.  Sodium 139, potassium 4.4, BUN 14, creatinine 0.9, glucose 106, total  bilirubin 1.1, alkaline phosphatase 104, SGOT 22, SGPT 28, total protein 7.9, albumin 4.4, and calcium 10.3.  PT/INR 13.7/1.1.  PTT is 26.  A preoperative CEA was less than 0.5.  PSA 1.92.  A preoperative chest x-ray - No acute disease.  IMPRESSION AND PLAN:  Mr. Dill is a 60 year old [place] man, status post sigmoid colectomy on September 09, 1999 for a T2, N1 (one of 11 lymph nodes positive) moderately-differentiated colon cancer, with a negative CEA preoperatively and a negative chest x-ray.  Dr. Darnelle Catalan had a long discussion with the patient and provided him with statistical information  related to adjuvant chemotherapy.  The patient was informed that there is a 40% chance of cure with surgery alone and that this number rises to 60% with adjuvant chemotherapy (his prognosis is likely somewhat better as he is a "early" stage III).  Dr. Darnelle Catalan contacted the oncology research protocol nurses to orient the patient to the studies that are currently available.  Mr. Treat will see Dr. Darnelle Catalan in the office in approximately three weeks for discussion, as well as a final decision related to treatment.  The patient also had many questions related to nutrition and weight.  Vernell Leep, the dietitian, spoke with the patient and his family and provided them with information regarding possible symptoms associated with chemotherapy, as well as answer to questions.  Further staging studies will be required if patient does decide to participate in a research study.  This was discussed with Hattie from the Oncology Research Department, who will help to coordinate these studies should the patient be enrolled. DD:  09/11/99 TD:  09/14/99 Job: [id number]

## 2010-07-03 NOTE — Op Note (Signed)
NAME:  Mark [name], Mark [name]                         ACCOUNT NO.:  [id number]   MEDICAL RECORD NO.:  [id number]                   PATIENT TYPE:  AMB   LOCATION:  [facility]                                 FACILITY:  [facility]   PHYSICIAN:  Sigmund I. Patsi Sears, M.D.         DATE OF BIRTH:  1950/11/14   DATE OF PROCEDURE:  05/06/2003  DATE OF DISCHARGE:                                 OPERATIVE REPORT   PREOPERATIVE DIAGNOSIS:  Gross hematuria.   POSTOPERATIVE DIAGNOSIS:  Bladder tumor.   PROCEDURES:  1. Cystoscopy.  2. Transurethral resection of the bladder tumor (1 cm).  3. Bilateral retrograde pyelograms.  4. Urethral dilation.   SURGEON:  Sigmund I. Patsi Sears, M.D.   ASSISTANT:  Thyra Breed, M.D.   ANESTHESIA:  General endotracheal anesthesia.   COMPLICATIONS:  None.   DRAINS:  73 French Foley catheter.   FINDINGS:  There was a papillary appearing bladder tumor on the left  posterolateral wall.  In addition, there was another smaller bladder tumor  on the right posterior lateral closer to the dome.  This was surrounded by  several small satellite bladder tumors.   INDICATIONS FOR PROCEDURE:  This is a 60 year old male with a history of  gross hematuria.  The patient also has a history of colon cancer, currently  in remission.  The patient had a negative office flexible cystoscopy.  He is  brought to the operating room today to undergo cystoscopy, possible random  bladder biopsies as well as prostatic urethral biopsies and right and left  pyelograms with selective cytology.  The patient understands the The risks,  benefits, and alternatives of this procedure and is willing to proceed.   PROCEDURE IN DETAIL:  Following identification by his arm bracelet, the  patient was brought to the operating room and placed in the supine position.  He then received preoperative IV antibiotics and underwent general  endotracheal anesthesia.  He was placed in the dorsal lithotomy position and  his  perineum and genitalia prepped and draped in the usual sterile fashion.  The 18-degree cystoscope was then inserted transurethrally through a 22  French sheath.  This revealed a normal appearing, anterior and posterior  urethra.  Upon entry into the bladder both the right and the left ureteral  orifice were in their normal anatomic location effluxing clear urine  bilaterally.  Careful inspection of the bladder first revealed approximately  1 cm papillary bladder tumor on the left posterolateral wall well away from  the left ureteral orifice.  The stalk appeared to be a rather narrowed.  Further inspection of the bladder revealed a second lesion approximately one-  fourth of the size of the aforementioned lesion on the right posterolateral  wall closer to the dome.  Again, well away from the right ureteral orifice.  Surrounding this smaller bladder tumor were several satellite lesions within  a circumferential area of approximately 1 cm.  Further cystoscopic  evaluation with both  the 18 and the 70-degree cystoscopic lens revealed no  further evidence of mucosal irregularities, foreign bodies or stones.   Using a 6 French in-hole catheter, and a 0.038 Glidewire we then gently  cannulated the left ureteral orifice.  The Glidewire was advanced to the  left renal pelvis under direct fluoroscopic guidance.  The 6 French in-hole  stent was then advanced over this and seemed to ascend into the left renal  pelvis. The wire was then removed.  Left retrograde ureteropyelography was  then performed.  This revealed no evidence of filling defects or mucosal  irregularities.  We then performed an identical procedure on the right.  Again, right retrograde ureteral pyelography revealed no evidence of filling  defects or mucosal irregularities.  We then turned our attention to the  bladder tumors.   Initially the 65 French sheath with the resectoscope could not be passed the  patient's urethral meatus.   Therefore, [employer] sounds were used to dilate  the urethral meatus to approximately 30 [place].  The resectoscope was then  passed without difficulty.  Using the loop, the left posterolateral wall  tumor was deeply resected in its entirety.  The edges of the resection site  were then gently coagulated to obtain excellent hemostasis.  The bladder  tumor was removed through the bladder with [facility] syringe.  This lesion was  then sent for pathologic analysis.   The resectoscope was reinserted into the bladder; and we then turned our  attention to the far right posterolateral wall tumor with surrounding  satellite lesions.  The initial bladder tumor was resected deeply in its  entirety the surrounding satellite lesions were then coagulated in their  entirety.  Both this and the other biopsy site were then reevaluated and the  edges cauterized.  There was excellent hemostasis with flow turned off to  the resectoscope.  Again, this bladder tumor was irrigated with a [place]  syringe and sent for pathologic analysis as a separate specimen.   At this time, the bladder was drained and an 18 French Foley catheter  inserted.  The catheter was seen to irrigate without difficulty and the  urine was clear.  At this time we instilled 30 cc of a 30 mg solution of  mitomycin C into the patient's bladder and applied a catheter plug to the  Foley catheter.  __________ mitomycin would be drained in approximately 1  hour.  This marked termination of the procedure.  The patient tolerated the  procedure well and there were no complications.  Please note that Dr.  Patsi Sears was present and participated in the entire procedure as he was  the responsible surgeon.   DISPOSITION:  After awaking from general anesthesia the patient was  transported to the postanesthesia care unit in stable condition.  From here  he will be discharged home with this Foley catheter.    Thyra Breed, MD                             Sigmund I. Patsi Sears, M.D.    EG/MEDQ  D:  05/06/2003  T:  05/07/2003  Job:  [id number]

## 2010-07-03 NOTE — Op Note (Signed)
   NAME:  Mark [name], Mark [name]                         ACCOUNT NO.:  [id number]   MEDICAL RECORD NO.:  [id number]                   PATIENT TYPE:  OIB   LOCATION:  3172                                 FACILITY:  [facility]   PHYSICIAN:  Danae Orleans. Venetia Maxon, M.D.               DATE OF BIRTH:  04/15/1950   DATE OF PROCEDURE:  10/09/2002  DATE OF DISCHARGE:                                 OPERATIVE REPORT   PREOPERATIVE DIAGNOSIS:  Chronic intractable bilateral lower extremity pain  secondary to peripheral neuropathy secondary to chemotherapy for metastatic  colon cancer.   POSTOPERATIVE DIAGNOSIS:  Chronic intractable bilateral lower extremity pain  secondary to peripheral neuropathy secondary to chemotherapy for metastatic  colon cancer.   PROCEDURE:  Percutaneous spinal cord stimulator trial.   SURGEON:  Danae Orleans. Venetia Maxon, M.D.   ANESTHESIA:  MAC with local anesthetic.   COMPLICATIONS:  None. Disposition to the recovery room.   INDICATIONS FOR PROCEDURE:  Mark [name] is a 60 year old man with  bilateral lower extremity pain secondary to peripheral neuropathy which is  secondary to chemotherapy for metastatic colon cancer. He has chronic  intractable bilateral lower extremity pain. It was elected to take him to  surgery for percutaneous placement  of trial electrode for chronic  intractable pain.   DESCRIPTION OF PROCEDURE:  [name] was brought to the operating room. He  was given intravenous sedation. He was placed in the prone position  on the  operating table. His back was prepped and draped in the usual sterile  fashion.   Using fluoroscopy and interoperative electrophysiologic evaluation, a  percutaneous [employer]onic spinal dorsal column stimulator lead was placed.  Initial needle  positioning was in a paramedian approach at the L3 level.  Cerebrospinal fluid was encountered and consequently the needle was moved up  to L2 and entering the spinal canal at the T12-L1 level. The  electrode was  navigated so that the distal  tip of the electrode was at the 10th rib in  the midline, slightly to the right of the midline.   Using stimulation the patient had good relief of bilateral lower extremity  pain with the  center 2 electrodes being utilized. The electrode was then  anchored after removing the Tuohy needle  and stylet and a sterile occlusive  dressing was placed.   The patient was taken to the recovery room in satisfactory stable condition.  He tolerated  the procedure well.                                               Danae Orleans. Venetia Maxon, M.D.    JDS/MEDQ  D:  10/09/2002  T:  10/09/2002  Job:  [id number]

## 2010-07-03 NOTE — Assessment & Plan Note (Signed)
Mark [name] is here in followup of his chronic pain and peripheral neuropathy.  He was diagnosed in February with bladder cancer.  He has undergone surgery  and chemotherapy under the guidance of Dr. Patsi Sears.  Apparently with the  surgery and the chemotherapy placement, the patient had some leakage of this  chemotherapy agent and had an irritating effect to the peritoneal cavity and  some of the colon.  He has had problems off and on with constipation as well  and had multiple admissions over the last two months.  Over this time, the  patient has remained on his Duragesic patch at 75 mcg/hr.  He is using an  occasionally Vicodin for breakthrough pain.  He is having some problems  sleeping.  The stimulator helps his pain to a certain extent, but he is  really trying to limit the stimulator because as the more he uses this the  less relief he gets.  Additionally, the nerve stimulator does not cover his  toes, which seem to bother him the most.   The patient rates his pain on average at a 7/10.  It is worse with prolonged  walking and working.  It is better with rest and medications.  He uses  homeopathic-type ointment to the toes, which seems to have given him some  relief as well.  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  He has gone through a reasonable amount of depression as a result  of all of these problems.   Additionally, the patient is on Cymbalta 60 mg a day for neuropathic pain,  which we had started a few months back.   REVIEW OF SYSTEMS:  The patient denies any chest pain, shortness of breath,  cold, or respiratory symptoms.  He has had no new numbness, weakness,  spasms, problems with vision, or anxiety.  Mood is a bit flat.  He has some  problems sleeping at night due to the pain.  He denies any nausea, vomiting,  reflux, or diarrhea.  He has had constipation and bladder  incontinence.  He  does report excessive sweating.   PHYSICAL EXAMINATION:  On physical examination today, the blood pressure is  141/87 with a pulse of 96.  He is saturating 99% on room air.  The  respiratory rate is 14.  The patient walks with a fairly normal gait.  Alert.  Appearance is normal.  Essentially no new physical exam changes in  the lower extremities.  No focal skin changes are noted.  Motor exam is  within reasonable limits.  His sensory exam is unchanged.   ASSESSMENT:  1. Peripheral neuropathy.  2. 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  3. Bladder cancer, status post surgical resection and chemotherapy.  The     patient is due for a series of chemotherapy/bladder washings in the     future.   PLAN:  1. We will continue his fentanyl patch at 75 mcg q.72h.  2. I would like to restart him on Ultram using a b.i.d. schedule initially     with one dose per day as needed for breakthrough pain.  He may use his     Vicodin on occasion as well.  3. We will increase his Cymbalta to 120 mg daily.  4. We discussed capsaicin ointment treatments and I would like for him to     try these.  We will start with twice a day at least for one week to see     if there is any relief with the ointment.  Ideally I would like to see     him use it three times a day.  There is another treatment that is out and     available now called Axsain and I will see readily     available this is in the community.  This is a combination of capsaicin     ointment with lidocaine.  5. I will see the patient back in approximately one month's time.      Ranelle Oyster, M.D.   ZTS/MedQ  D:  07/02/2003 17:04:07  T:  07/02/2003 20:21:56  Job #:  [id number]   cc:   Valentino Hue. Magrinat, M.D.  [address] [facility]  [place]  [place] [address]  Fax: [phone]   Sigmund I. Patsi Sears, M.D.  [address]  [place]  [place] [address]  Fax: [phone]

## 2010-07-03 NOTE — Consult Note (Signed)
[facility]  Patient:    TEVON, BERHANE Visit Number: [id number] MRN: [id number]          Service Type: PMG Location: [facility] Attending Physician:  Sondra Come Dictated by:   Sondra Come, D.O. Proc. Date: 07/28/01 Admit Date:  06/21/2001   CC:         Raymond Gurney C. Magrinat, M.D.   Consultation Report  INTERVAL HISTORY:  Mr. Lacko returns to clinic for follow-up.  He was initially seen on Jun 23, 2001.  In the interim the patient continues to have significant "freezing" type pain in his feet secondary to chemotherapy-induced peripheral neuropathy.  The patient did not get any relief with the addition of dextromethorphan.  He also was unable to tolerate Capzasin cream.  We discussed further treatment options for his peripheral neuropathy to include membrane-stabilizing medication such as Zonegran as the patient was unable to tolerate neurontin in the past, narcotic-based pain medication, and interventional procedure such as lumbar epidural steroid injection or even lumbar sympathetic blocks.  We discussed these at length.  The patients pain is an 8/10 on a subjective scale.  Function and quality of life indices have not changed.  Sleep is good.  The patient continues to take hydrocodone 7.5 mg b.i.d., which takes the edge off.  He states that it is short-lasting and he could potentially take more but does not like to take so many pills.  He also continues to take Ambien for sleep.  I reviewed the health and history form and 14-point review of systems.  PHYSICAL EXAMINATION:  GENERAL:  A healthy male in no acute distress.  VITAL SIGNS:  Blood pressure 147/76, pulse 80, respirations 12, O2 saturation 96% on room air.  MUSCULOSKELETAL/NEUROLOGIC:  Examination of the feet reveals no discoloration. No heat, erythema, or edema.  There is no allodynia or hyperpathia noted. Manual muscle testing is 5/5 bilateral lower extremities.  Sensory exam intact to  light touch bilateral lower extremities.  Muscle stretch reflexes are 1+/4 bilateral lower extremities.  IMPRESSION:  Peripheral neuropathy secondary to chemotherapy, somewhat atypical presentation.  PLAN: 1. Again I had a thorough discussion with Mr. Sliger regarding treatment    options.  At this point it is reasonable to proceed with Zonegran 100 mg    daily for two weeks and re-evaluate.  Consider increasing the dose at that    time if not effective. 2. Continue Lortab 7.5 mg as needed for now.  Consider Duragesic patch if    symptoms are not improved. 3. Consider lumbar epidural steroid injection to enhance overall pain control. 4. Patient to return to clinic in two weeks for re-evaluation.  The patient was educated on the above findings and recommendations and understands.  There were no barriers to communication. Dictated by:   Sondra Come, D.O. Attending Physician:  Sondra Come DD:  07/30/01 TD:  08/01/01 Job: [id number]

## 2010-07-03 NOTE — Cardiovascular Report (Signed)
[place]. [facility]  Patient:    Mark [name], Mark [name]                      MRN: [id number] Proc. Date: 05/16/00 Adm. Date:  [id number] Attending:  Norman Clay CC:         Cardiac Catheterization Laboratory  Abran Cantor. Clovis Riley, M.D.  Valentino Hue. Magrinat, M.D.   Cardiac Catheterization  PROCEDURE:  Cardiac catheterization.  CARDIOLOGIST:  Darden Palmer., M.D.  INDICATIONS:  A 60 year old male with a chest pain syndrome, consistent with unstable angina pectoris, prolonged episode of chest pain at rest, MI ruled out.  COMMENTS ABOUT PROCEDURE:  The patient tolerated the procedure well without complications, and had adequate hemostasis and peripheral pulses following the end of the procedure.  Please see the attached catheterization log for catheters used, and the remainder of the details.  HEMODYNAMIC DATA: Aorta post-contrast:  141/82. LV post-contrast:  141/16.  An A-wave is present at 21.  ANGIOGRAPHIC DATA: LEFT VENTRICULOGRAM:  Performed in the 30-degree RAO projection revealed the aortic valve was normal.  The mitral valve was normal.  The left ventricle appears normal in size.  The estimated ejection fraction was 55%-60%.  The coronary arteries arise and distribute normally.  There was no significant coronary calcification present.  RESULTS: 1. Left main coronary artery:  The left main coronary artery is somewhat    long, but contains no significant stenosis. 2. Left anterior descending coronary artery:  The left anterior descending    coronary artery extends around the apex and has a large diagonal branch.    There is no significant disease present. 3. Circumflex coronary artery:  The circumflex coronary artery contains    no significant disease. 4. Right coronary artery:  The right coronary artery is a dominant vessel    that contains no significant disease.  IMPRESSION: 1. Normal left ventricular function. 2. No  significant coronary artery disease identified at this visit.  RECOMMENDATIONS:  The patient will be discharged home later on this afternoon. He will have intensive gastroenterologic therapy, and may need to have an endoscopy in the future. DD:  05/16/00 TD:  05/16/00 Job: [id number]

## 2010-07-03 NOTE — Assessment & Plan Note (Signed)
MEDICAL RECORD NUMBER:  [id number]   Mark [name] is back regarding his chronic pain. He has been stable with his  bladder and the urological issues since I last saw him. He has been going  for anodyne treatments with some success. He feels relief for 2 or 3 hours  after the therapy. He has looked into buying a unit and a unit will cost him  approximately $1300. The patient rates his pain as a 6-7/10. He describes it  as sharp, aching, tingling, and constant. He does get cold sensations of his  feet. The cold weather outside affects the legs adversely. Pain interferes  with general activity, relations with others, and enjoyment of life on a  severe level. Pain seems to be worse with walking and standing. Sleep is  fair.   REVIEW OF SYSTEMS:  The patient reports numbness, trouble walking, tingling,  decreased mood, and anxiety. Denies any constitutional problems. He does  have some constipation. Denies any cardiorespiratory issues today.   SOCIAL HISTORY:  The patient is married and wife remains supportive.   MEDICATIONS:  1.  Fentanyl 15 mcg q.72h.  2.  Lyrica 50 mg b.i.d.  3.  Ultram 50 mg daily p.r.n.  4.  Cymbalta 60 mg b.i.d.  5.  Restoril 30 mg q.h.s.  6.  Ativan 1 mg q.h.s.   PHYSICAL EXAMINATION:  Blood pressure is 107/65, pulse 73, respiratory rate  is 16, he is saturating 100% in room air. The patient is pleasant, in no  acute distress. He is alert and oriented x3. Affect is generally bright and  appropriate. Gait is limping and antalgic to each side. Heart is regular  rate and rhythm, lungs are clear, abdomen is soft and nontender. Reflexes  are decreased in the lower extremities. Sensation is decreased. He has some  mild hyperpathia in the legs. Strength is generally preserved with some  decreased motor function peripherally. Abdomen is soft nontender and  urostomy bag in place. Lungs are clear, heart is regular rate.   ASSESSMENT:  1.  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  2.  History of bladder cancer status post removal of bladder, prostate,      ureter, and appendix.  3.  Urostomy.  4.  Reactive depression.   PLAN:  1.  Continue anodyne treatments to completion. The patient will consider      buying a unit, depending on his course. I believe he is halfway through      the treatments currently.  2.  Continue Lyrica 50 mg b.i.d. and fentanyl 50 mcg q.72h. He will use      Ultram as needed p.r.n. Continue Cymbalta 60 mg b.i.d.  3.  Consider Namenda trial.  4.  I will see the patient back in about 3 months' time.     Ranelle Oyster, M.D.  Electronically Signed    ZTS/MedQ  D:  01/19/2005 12:46:50  T:  01/19/2005 15:44:55  Job #:  [id number]

## 2010-07-03 NOTE — Consult Note (Signed)
NAME:  Mark [name], Mark [name]                           ACCOUNT NO.:   MEDICAL RECORD NO.:  [id number]                    PATIENT TYPE:   LOCATION:                                       FACILITY:   PHYSICIAN:  Sondra Come, D.O.                 DATE OF BIRTH:   DATE OF CONSULTATION:  11/09/2001  DATE OF DISCHARGE:                                   CONSULTATION   HISTORY OF PRESENT ILLNESS:  The patient returns to clinic today for re-  evaluation.  He was last seen on October 26, 2001.  We had been working  towards improving his peripheral neuropathy secondary to chemotherapy.  He  has failed multiple medication trials including Neurontin, Zonegran, and  Lidoderm which we have tried most recently.  He states that the Lidoderm  samples which I provided caused a rash on his feet, so he discontinued it  and did not fill his prescription.  He continues to have pain throughout the  day and takes hydrocodone 10mg /650 mg two to four times per day with  significant breakthrough.  His pain is 9/10 on subjective scale.  We have  also been discussing nonpharmacologic treatment to include a galvanic  neuromuscular stimulator.  I have provided him with a pamphlet to review.  He does have some barriers to this because of insurance reasons; however, I  have instructed him to contact the company to discuss out of pocket expenses  in this regard.  He also continues to take Lorazepam at bedtime for sleep.  His  function and quality of life indices remain declined, although he  states the hydrocodone does allow him to function.  No new neurologic  complaints.  I reviewed the health and history form and 14-point review of  systems.   PHYSICAL EXAMINATION:  GENERAL APPEARANCE:  A healthy male in no acute  distress.  VITAL SIGNS:  Blood pressure 150/80, pulse 86, respiratory rate 18, O2  saturation 96% on room air.  EXTREMITIES:  Examination of the feet and legs reveals no heat, erythema or  edema.  There  is no allodynia or hyperpathia noted in the feet.  NEUROLOGIC:  Manual muscle testing is 5/5 bilateral lower extremities.  Sensory examination is intact to light touch bilateral lower extremities.  Muscle stretch reflexes are symmetric bilateral lower extremities.   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   PLAN:  1. The patient and I had a long discussion again regarding further treatment     options.  This is an extensive consultation of 25 minutes duration.  I     think, at this point, given his failed trials of Neurontin and Zonegran     and improvement with hydrocodone, although only temperature with     considerable breakthrough, it is reasonable to transition him to a     pharmacokinetically longer acting opioid medication and we will start  methadone 5 mg one p.o. t.i.d.  Methadone also has weak NMBA receptor     antagonistic effect.  The patient understands that the methadone is not a     cure for his problem but should help his symptoms.  2. Consider galvanic neuromuscular stimulator such as the micro-Z stimulator     through [facility].  This type of stimulator will enhance circulation     and hopefully lead to healing process.  3. Would consider Gabitril, low dose, at bedtime, to enhance the effect of     methadone.  4. Consider changing Lorazepam to clonazepam.  5. The patient to return to clinic in one month or sooner as needed.   The patient was educated on the above findings and recommendations and  understands.  There were no barriers to communication.  He is instructed to  contact our clinic if he is unable to tolerate methadone and the side effect  profile has been reviewed.                                               Sondra Come, D.O.    JJW/MEDQ  D:  11/09/2001  T:  11/09/2001  Job:  [id number]   cc:   Valentino Hue. Magrinat, M.D.  [address] [facility]  [place]  [place] [address]  Fax: [phone]

## 2010-07-03 NOTE — Discharge Summary (Signed)
[place]. [facility]  Patient:    Mark [name], Mark [name]                      MRN: [id number] Adm. Date:  [id number] Disc. Date: [id number] Attending:  Delsa Bern CC:         Venita Lick. Pleas Koch., M.D. [facility]  Valentino Hue. Magrinat, M.D.   Discharge Summary  DISCHARGE DIAGNOSIS:  Adenocarcinoma of the sigmoid colon.  OPERATIONS AND PROCEDURES:  Sigmoid colectomy on September 09, 1999.  HISTORY OF PRESENT ILLNESS:  Mark [name] is a 60 year old white male who has long-standing irritable bowel syndrome with occasional diarrhea and constipation.  More recently he noted some increase in these symptoms, as well as some discomfort around the tip of his tail bone.  The patient was seen by Dr. Russella Dar, and due to change in his bowel habits and discomfort, colonoscopy was recommended and performed.  Findings were a 2.5 cm ulcerated mass described in the proximal sigmoid colon.  The remainder of the colon was normal.  Biopsies revealed adenocarcinoma.  After discussion, we have now admitted him for elective sigmoid colectomy following mechanical and antibiotic bowel prep at home.  PAST MEDICAL HISTORY:  He is generally quite healthy.  Was treated for a serious head injury at age 59.  He has GERD well controlled with Prilosec daily which is his only medication.  PAST SURGICAL HISTORY:  Laparoscopic cholecystectomy.  ALLERGIES:  PENICILLIN.  SOCIAL HISTORY:  See detailed H&P.  FAMILY HISTORY:  See detailed H&P.  REVIEW OF SYSTEMS:  See detailed H&P.  PERTINENT PHYSICAL EXAMINATION:  GENERAL:  Extremely healthy-appearing, well-developed white male.  VITAL SIGNS:  Normal.  Physical exam was entirely unremarkable.  HOSPITAL COURSE:  On the morning of admission he underwent an uneventful sigmoid colectomy.  There was no evidence of metastatic disease at the time of exploration.  His postoperative course was very smooth.  He was started on clear liquids on the  first postoperative day which he tolerated well.  He had excellent pain control with PCA morphine, and was able to be switched to oral pain medication on the second postoperative day.  His bowels began to move on the third postoperative day, and his diet was advanced.  He was felt ready for discharge on the fourth postoperative day.  His incision is healing primarily. He is up and ambulatory.  He is tolerating a regular diet.  Final pathology revealed a 2.5 cm invasive adenocarcinoma with 1:11 lymph nodes involved with metastatic carcinoma.  This tumor was staged T2, N1, M0.  He was seen while in the hospital by Dr. Jeanette Caprice, and has follow up arranged with him. DD:  10/08/99 TD:  10/09/99 Job: [id number]

## 2010-07-03 NOTE — Op Note (Signed)
[place]. [facility]  Patient:    Mark [name], Mark [name]                        MRN: [id number] Proc. Date: 09/09/99 Attending:  Sharlet Salina T. Hoxworth, M.D.                           Operative Report  PREOPERATIVE DIAGNOSIS:  Carcinoma of the sigmoid colon.  POSTOPERATIVE DIAGNOSIS:  Carcinoma of the sigmoid colon.  SURGICAL PROCEDURE:  Sigmoid colectomy.  SURGEON:  Lorne Skeens. Hoxworth, M.D.  ASSISTANT:  Currie Paris, M.D.  ANESTHESIA:  General.  INDICATIONS:  Mr. Losee is a 60 year old white male who recently noticed a change in bowel habits and a colonoscopy was performed by Dr. Judie Petit T. Pleas Koch.  A 2.5 cm ulcerated mass was found in the mid-sigmoid colon, and biopsy has revealed adenocarcinoma.  A colectomy has been recommended and accepted.  The nature of he procedure, its indications, risks of bleeding and infection were discussed and understood preoperatively.  He is now brought to the operating room for this procedure.  DESCRIPTION OF PROCEDURE:  The patient was brought to the operating room and placed in the supine position on the operating table and general endotracheal anesthesia was induced.  Mechanical and antibiotic bowel prep had been performed at home.  PAS were placed.  A Foley catheter was inserted.  The abdomen was sterilely prepped and draped.  Broad spectrum antibiotics were given intravenously.  A low midline incision was used and dissection was carried down through the subcutaneous tissue and the midline fascia of the peritoneum using cautery.  A thorough exploration of the abdomen was performed.  The sigmoid colon was quite long and redundant, and  there was a firm approximately 2.0 cm mass palpable in the mid-most mobile portion of the sigmoid colon.  There was no apparent invasion through the bowel wall. he remainder of the colon was normal to palpation.  The stomach, liver, duodenum, nd retroperitoneum were  all unremarkable.  The small bowel was packed into the upper abdomen and a Balfour retractor was placed.  The sigmoid colon was mobilized, the body of the peritoneum laterally along the line of Toldt.  There was generous sigmoid colon present for a wide resection, without mobilizing the splenic flexure. Points of resection at the proximal and distal sigmoid colon were chosen. These areas were cleaned of mesentery and pericolic fat and divided between Freida Busman and  Kocher clamps.  The mesentery of the involved segment was then sequentially divided between clamps and tied with #2-0 silk ties, and the specimen removed.  The ureter had been identified on the left side and carefully protected.  Following this, n end-to-end anastomosis was created with interrupted inverting full thickness #2-0 silk sutures.  This was widely patent and under no tension, and with good blood supply.  At this point the gloves and instruments were changed.  The mesenteric defect was closed with interrupted #2-0 silk.  The abdomen was inspected for hemostasis which was complete.  The viscera were returned to the anatomic position.  The midline  fascia was closed using running #1 PDS, beginning at the end of the incision and tied centrally.  The subcutaneous tissue was irrigated with antibiotic solution and the skin closed with staples.  The sponge and instrument counts were correct.  dry sterile dressing was applied.  The patient was taken to the recovery room  in good condition. DD:  09/09/99 TD:  09/10/99 Job: [id number]

## 2010-07-03 NOTE — Assessment & Plan Note (Signed)
Monday, January 24, 2006   Mark [name] is back regarding his chronic pain.  The pain is averaging about  7/10 and he states that it is fairly manageable with his current  regimen.  We adjusted his cream for his legs the last visit and he has  found this helpful.  He is tolerating Fentanyl, Lyrica, and Ultram.  He  changed back to Cymbalta as he felt it was more helpful for his pain  compared to the Effexor.  The patient has had no new medical problems.  The pain is described as constant and sharp.  Sleep is fair.  Pain seems  to be worse when he is bearing weight on the leg and sometimes at night.  Pain improves with rest and his medications.  He decided not to go  through with the acupuncture.   REVIEW OF SYSTEMS:  Positive for trouble walking, spasms, confusion,  depression, some constipation, occasional skin rash.  Other pertinent  positives as above.  Full review is in the health and history section.   SOCIAL HISTORY:  The patient is married, and they have been busy with  some family matters over the last few months, but otherwise, they have  been doing well.   PHYSICAL EXAM:  Blood pressure is 110/70, pulse is 68, respiratory rate  is 16, satting 96% on room air.  The patient is pleasant in no acute distress.  He is alert and oriented  x3.  Affect is bright and appropriate.  Gait is stable and a bit wide-based.  Reflexes are 1+.  The patient's  sensation is decreased in the distal limbs.  Motor function is 4+/5  throughout.  Color of the skin is good.  He has good skin temperature.  HEART:  Regular rate.  CHEST:  Clear.  ABDOMEN:  Soft and nontender.  Cognitively, he was within normal limits.  Cranial nerve exam was  normal.   ASSESSMENT:  1. 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  2. History of bladder cancer status post bladder, prostate, ureter,      and appendix removal.  3. Active urostomy.  4. Reactive depression.   PLAN:  1. The patient has been  quite stable over the last few months with his      pain, and seems to be fairly happy with the program.  He feels      comfortable handing off medication management to his primary care      physician, Dr. Clovis Riley.  That would be fine with me as well, as      long as Dr. Clovis Riley approves.  I did write Deaken several 3 month      prescriptions today, including his Fentanyl 50 mcg q. 72 hours #10,      Lyrica 50 mg b.i.d. #180, Cymbalta 60 mg 2 tablets q. a.m. #180,      Ultram 50 mg 1 p.o. q.6 hours #360.  2. The patient has refills on his topical cream with 4% Elavil, 2%      baclofen, and 20% ketoprofen.  He will apply this b.i.d. p.r.n.  3. Encouraged exercise and appropriate diet.  4. I am happy to see [place] in the future as needed.  He will see Dr.      Clovis Riley potentially for refills in the near future.      Ranelle Oyster, M.D.  Electronically Signed     ZTS/MedQ  D:  01/24/2006 11:53:13  T:  01/24/2006 15:30:30  Job #:  [id number]  cc:   L. Lupe Carney, M.D.  Fax: [phone]

## 2010-07-03 NOTE — Consult Note (Signed)
NAME:  Mark [name], Mark [name]                         ACCOUNT NO.:  [id number]   MEDICAL RECORD NO.:  [id number]                   PATIENT TYPE:  REC   LOCATION:  [facility]                                  FACILITY:  [facility]   PHYSICIAN:  Sondra Come, D.O.                 DATE OF BIRTH:  [birth year]   DATE OF CONSULTATION:  09/25/2001  DATE OF DISCHARGE:                  PHYSICAL MEDICINE & REHABILITATION CONSULTATION   HISTORY OF PRESENT ILLNESS:  The patient returns to clinic today for  reevaluation.  He was last seen on July 28, 2001.  The patient was  originally to follow up two weeks following his last appointment; however,  he canceled his appointment and rescheduled for today.  He continues to have  foot pain bilaterally secondary to chemotherapy induced peripheral  neuropathy.  He did not get any relief with the Zonegran 100 mg daily trial.  He continues to take hydrocodone 7.5 mg b.i.d. to t.i.d. which takes the  edge off.  He tries to limit himself to two per day.  On occasion he has had  to take four pills per day.  He does not really like taking medications.  His pain today is a 9/10 on a subjective scale.  His function and quality of  life indexes have declined.  His sleep is poor.  He feels like his feet are  freezing.  I reviewed health and history form and 14 point review of  systems.  No new neurologic complaints.   PHYSICAL EXAMINATION:  GENERAL:  Healthy male in no acute distress.  VITAL SIGNS:  Blood pressure 145/84, pulse 74, respirations 18, O2  saturation 96% on room air.  EXTREMITIES:  There is no heat, erythema, or edema in the lower extremities.  Feet are warm bilaterally with good peripheral pulses.  There is no  allodynia or hyperpathia noted.  No discoloration or dystrophic changes.  No  tenderness to palpation over the plantar fascia.  NEUROLOGIC:  Sensory examination is intact to light touch bilateral lower  extremities.  Muscle stretch reflexes are 1+/4 bilateral  lower extremities.  Manual muscle testing is 5/5 bilateral lower extremities.   IMPRESSION:  Peripheral polyneuropathy secondary to chemotherapy with  somewhat of an atypical presentation.  The patient possibly has some central  sensitization.   PLAN:  1. Continue to discuss treatment options with the patient.  At this point it     is reasonable to proceed with a trial of lumbar epidural steroid     injections to assist with overall pain control.  2. If symptoms are not improved with epidural steroids, would consider     reinitiating Zonegran and increasing up to 400 mg q.d. as predicated upon     patient's response and tolerance.  The patient does describe some     financial concerns since this would be out-of-pocket expense.  3. Would consider methadone for long-acting agent.  4. Consider a galvonic neuromuscular stimulator.  Again, patient has     financial concerns in this regard.  5. The patient is to return to clinic for a lumbar epidural steroid     injection.  Procedure was described to patient in detail including risks,     benefits, limitations, and alternatives.  The patient understands that     the injection may not help his pain.  The patient wishes to proceed in     this fashion.   The patient was educated on the above findings and recommendations and  understands.  No barriers to communication.                                               Sondra Come, D.O.    JJW/MEDQ  D:  09/25/2001  T:  09/28/2001  Job:  [id number]   cc:   Valentino Hue. Magrinat, M.D.  [address] [facility]  [place]  [place] [address]  Fax: [phone]

## 2010-07-03 NOTE — Discharge Summary (Signed)
[place]  Patient:    Mark [name], Mark [name]                      MRN: [id number] Adm. Date:  [id number] Disc. Date: 05/16/00 Attending:  Norman Clay CC:         Abran Cantor. Clovis Riley, M.D.  Valentino Hue. Magrinat, M.D.  Venita Lick. Pleas Koch., M.D. [facility]   Referring Physician Discharge [place]  FINAL DIAGNOSES: 1. Chest pain of uncertain etiology, myocardial infarction ruled out.    Coronary arteriogram showing no significant coronary disease. 2. Stage 3 colon cancer status post chemotherapy with 5-FU and oxaliplatin. 3. Peripheral neuropathy, possibly due to chemotherapy but of uncertain cause. 4. Anxiety and depression. 5. History of hiatal hernia and reflux esophagitis.  PROCEDURES:  Cardiac catheterization.  HISTORY:  This 60 year old male presented to the emergency room with a prolonged episode of substernal chest discomfort associated with sweating with radiation to the left arm.  It lasted an hour-and-a-half and was relieved with nitroglycerin and morphine.  He has had a three to four-week history of intermittent chest pressure prior to this.  He had recently completed chemotherapy for colon cancer.  He was admitted to rule out a myocardial infarction.  Please see the previously dictated History and Physical for remainder of the details.  HOSPITAL COURSE:  Chest x-ray shows no acute disease.  EKGs were within normal limits.  CBC showed a hemoglobin of 13.9, hematocrit 40.6, platelets 222.  BUN 10, creatinine 0.8, glucose 86, potassium 4.1, sodium 142.  Cardiac enzymes showed negative troponin and CPK-MB.  The patient was placed on heparin and IV nitroglycerin.  He was taken to the catheterization laboratory with next day with findings of normal left ventricular function; no significant coronary artery disease was identified. The results were discussed with the patient.  At the present time it was felt that his pain was likely  noncardiac in view of the normal EKG during pain and no significant coronary artery disease.  Possibility of reflux or esophageal spasm was a possibility.  DISPOSITION:  He is discharged after the catheterization in improved condition.  MEDICATIONS: 1. Prilosec 20 mg daily.  He is to continue: 2. Neurontin 600 mg t.i.d. 3. Paxil 20 mg daily. 4. Ambien q.h.s. 5. Hydroxyzine as needed for nerves.  ACTIVITY:  He is to exercise regularly.  FOLLOW-UP:  He was asked to follow up with Dr. Claudette Head for possibly GI evaluation if his symptoms reoccur.  He is to be seen in the office and was given instructions in one week for care of the catheterization site. DD:  05/16/00 TD:  05/16/00 Job: [id number]

## 2010-07-03 NOTE — Assessment & Plan Note (Signed)
INTERVAL HISTORY:  Mark [name] is back regarding his chronic pain.  He has been  fairly stable since I last saw him.  The pain ranges 6-7/10.  Fentanyl patch  is very beneficial.  He has received his disability.  The Lyrica, Ultram,  and Cymbalta have been tolerated fairly well.  He is sleeping with the  Restoril and Ativan.  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  He has to usually speak for about a half  hour per session.  The patient continues to describe his pain as sharp,  stabbing, aching, and constant.  It interferes with general activity,  relationship with others and enjoyment of life on a moderate to severe  level.  The patient is sleeping about 8-9 hours a night.  Pain is worse with  walking and standing for prolonged periods of time.   REVIEW OF SYSTEMS:  The patient reports trouble walking, dizziness,  confusion, depression, skin breakdown, constipation, abdominal pain,  shortness of breath.  He did have some basal cell carcinomas removed.  He  has had some dry skin but otherwise this is stable.   PHYSICAL EXAMINATION:  Blood pressure is 117/57, pulse is 73, respiratory  rate 16, he is saturating 96% in room air.  The patient is pleasant in no  acute distress.  He is alert and oriented x3.  Affect is bright and  appropriate.  Gait is stable.  Basal cell splotching is on the skin.  Skin  is slightly dry but generally intact otherwise.  Coordination is fair.  Reflexes are 2+.  Sensation is decreased in the distal lower extremities.  Heart is regular rate and rhythm.  Chest is clear.  Abdomen soft, nontender.  Cognitively he is appropriate.  His affect is a bit reserved but he looks  overall good today.   ASSESSMENT:  1.  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  2.  History of bladder cancer status post bladder, prostate, ureter, and      appendix removal.  3.  Urostomy.  4.  Reactive depression.   PLAN:  1.  Suggested  a trial of Namenda.  I gave the patient a starter pack today.      We will see how he responds to this.  He may have difficulties with      paying for this medication so he will decide if he wants to pursue      taking this longer term depending in his response.  2.  We will continue Lyrica 50 mg twice daily, Ultram 50 mg daily p.r.n.,      Cymbalta 60 mg twice daily.  3.  I refilled fentanyl Friday at 50 mcg q.72 h.  4.  I will see the patient back in 3 months' time.      Ranelle Oyster, M.D.  Electronically Signed     ZTS/MedQ  D:  04/19/2005 10:30:12  T:  04/19/2005 23:13:42  Job #:  [id number]

## 2010-07-03 NOTE — Consult Note (Signed)
   NAME:  Mark [name], Mark [name]                         ACCOUNT NO.:  [id number]   MEDICAL RECORD NO.:  [id number]                   PATIENT TYPE:   LOCATION:                                       FACILITY:   PHYSICIAN:  Zachary George, DO                      DATE OF BIRTH:  May 26, 1950   DATE OF CONSULTATION:  02/26/2002  DATE OF DISCHARGE:                                   CONSULTATION   HISTORY OF PRESENT ILLNESS:  The patient returns to the clinic today for  reevaluation.  He was last seen on January 05, 2002.  He continues to have  bilateral foot pain secondary to peripheral polyneuropathy due to  chemotherapy.  He states that his foot seems to worsen with the colder  temperature.  His pain today is an 8/10 on a subjective scale.  His pain is  well controlled with sparing use of methadone 5 mg which he takes one pill  daily and seldom two pills per day.  He also continues on nortriptyline 10-  20 mg at bedtime with improved sleep.  I reviewed the health and history  form and 14-point review of systems.  The patient states that his brother  has recently been diagnosed with hemachromatosis and he is planning on  following up with his hematologist-oncologist for work-up in this regard.   PHYSICAL EXAMINATION:  GENERAL APPEARANCE:  A healthy male in no acute  distress.  EXTREMITIES:  Examination of the feet does not reveal any heat, erythema, or  edema.  NEUROLOGIC:  Manual muscle testing is 5/5 in bilateral lower extremities.  Sensory examination is intact to light touch in bilateral lower extremities  at this time.  Muscle stretch reflexes are symmetric in bilateral lower  extremities.   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   PLAN:  1. Continue methadone 5 mg one to two per day as needed, #60 without     refills.  2. Continue nortriptyline 10-20 mg at bedtime for sleep and neuropathic     pain, #60 with two refills.  3. The patient is to follow up in two  months for reevaluation.   The patient was educated on the above findings and recommendations and  understands.  There were no barriers to communication.                                               Zachary George, DO    JW/MEDQ  D:  02/26/2002  T:  02/26/2002  Job:  [id number]   cc:   Valentino Hue. Magrinat, M.D.  [address] [facility]  [place]  [place] [address]  Fax: [phone]

## 2010-07-03 NOTE — Assessment & Plan Note (Signed)
Mark [name] is here in followup of his peripheral neuropathy.  He has done better  with the decreased Fentanyl patch at 50 mcg every 72 hours.  He is still in  the process of working up his prostate enlargement.  Biopsies pending.  He  continues on Cymbalta for mood and pain.  He has restarted the Lyrica at low  dose 50 mg q.h.s.  He will continue to try to work.  He remains stressed  over his multiple medical issues and becomes fatigued easily.  He is doing  better off of the Ditropan during the daytime.  He is using Ativan 1.5 mg at  bedtime as well as Restoril 30 mg q.h.s. for sleep.   SOCIAL HISTORY:  The patient is attempting to continue work part-time in [employer] rights [employer].  He plans to keep going for at least the next six months.   REVIEW OF SYSTEMS:  The patient reports nausea, vomiting, diarrhea,  constipation, bowel and bladder problems, decreased appetite, abdominal  pain, weakness, numbness, headaches, anxiety, blurred vision.   PHYSICAL EXAMINATION:  VITAL SIGNS:  Blood pressure is 139/77, pulse is 80,  respiratory rate is 20, sating 96% on room air.  GAIT:  The patient walks with an antalgic gait.  AFFECT:  Bright.  APPEARANCE:  Well kept.  NEUROLOGIC:  Cognition was much improved.  Cranial nerves II-XII were  grossly intact.  Motor and sensory exam appeared stable and within normal  limits in the upper extremities.  Lower extremities continued to show  decreased in a stocking glove distribution below the knees.   ASSESSMENT:  1.  Painful peripheral neuropathy.  2.  History of bladder cancer.  3.  New prostate enlargement.  4.  Reactive depression.   PLAN:  1.  Continue Fentanyl patch at 50 mcg every 72 hours.  2.  I would like to see him try to increase Lyrica as tolerated.  I would      ultimately like to see him get up to 50 mg q.i.d.  I will let him      increase it on his own schedule.  Increasing by 50 mg a week might be a      good way to start.  3.  The patient will  followup with urology regarding prostate issues.  4.  Consider Anodyne treatments at an outpatient center, if the patient      would like to pursue that.  He currently has      some other things on the table and will hold off on any other      committments at this point.  5.  I will see the patient back in three months' time.      Zach   ZTS/MedQ  D:  02/05/2004 12:33:31  T:  02/05/2004 14:37:55  Job #:  [id number]   cc:   Valentino Hue. Magrinat, M.D.  [address] [facility]  [place]  [place] [address]  Fax: [phone]   Jamison Neighbor, M.D.  [address]  [place]  [place] [address]  Fax: [phone]

## 2010-07-03 NOTE — H&P (Signed)
[place]  Patient:    Mark [name], Mark [name]                        MRN: [id number] Adm. Date:  05/15/00 Attending:  Darden Palmer., M.D. CC:         Abran Cantor. Clovis Riley, MD  Lorne Skeens. Hoxworth, M.D.   History and Physical  CHIEF COMPLAINT: Chest pain.  HISTORY OF PRESENT ILLNESS: The patient is a 60 year old male admitted to rule out myocardial infarction.  The patient has a history of colon cancer diagnosed last summer at a screening colonoscopy, with stage 3 disease.  He has completed six months of chemotherapy with 5-FU, leucovorin, and an experimental agent, Oxyplatinum.  He has had a three to four week history of episodic mid sternal chest pressure and heaviness.  He is somewhat vague in his description of the pain.  It is intermittent and is not related to activity, body position, or with food.  It will occur for variable periods of time.  He has some radiation to the left arm.  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  He was sweaty with the pain, and came to the emergency room after it lasted around 1-1/2 hours. An initial EKG was unremarkable.  He was given intravenous heparin and nitroglycerin and the pain has diminished to where it is almost gone at the present time.  The pain does not have an exertional component and is not worse with food.  It is nonpleuritic and there has been no chest wall injury.  PAST MEDICAL HISTORY:  1. Colon cancer as noted above.  2. There has also been some history of depression.  3. He has a mildly high cholesterol.  4. He has been treated now for peripheral neuropathy.  There is no history of diabetes or hypertension.  PAST SURGICAL HISTORY:  1. Gallbladder surgery.  2. Sigmoid colectomy for colon  cancer.  3. He had a head injury as a child and received bur holes.  ALLERGIES: PENICILLIN.  CURRENT MEDICATIONS:  1. Paxil 20 mg q.d.  2. Prilosec 20 mg q.d.  3. Ambien for sleep.  4. Lorazepam.  5. Hydroxyzine.  6. Neurontin recently begun.  FAMILY HISTORY: Father died at the age of 90 ______.  Mother died at the age of 33 of heart failure.  A sister, age 66, has diabetes.  One brother has hyperlipidemia.  A sister also has breast cancer.  There is no family history of premature cardiac disease.  SOCIAL HISTORY: He is a native of [place], [place].  He attended [employer] and later went to [employer].  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  He is a nonsmoker and does not use alcohol to excess.  He has been married twice.  REVIEW OF SYSTEMS: He has complaints of arthritis and joint aches since beginning his chemotherapy and also complains of some feeling of his hands and toes being cold, and has recently been started on Neurontin for this.  He does not have any GU symptoms.  He does not have any significant edema.  He  does not have any eye, ear, nose, or throat problems.  He has a Port-A-Cath in place.  The remainder of the Review Of Systems is unremarkable except as noted above.  PHYSICAL EXAMINATION:  VITAL SIGNS: He is short at 5 feet 7 inches.  Weight 180-185 pounds.  Blood pressure 135/85, pulse 70.  GENERAL: He is a pleasant white male and appears in no acute distress.  SKIN: Warm and dry without masses or lesions.  HEENT: EOMI.  PERRLA.  C&S clear.  Fundi normal.  Pharynx negative.  NECK: Supple without masses, thyromegaly, or carotid bruits.  LUNGS: Clear to A&P.  CARDIOVASCULAR: Normal S1 and S2.  No S3, S4, or murmur.  ABDOMEN: Soft, nontender.  No masses or organomegaly.  EXTREMITIES: Peripheral  pulses were present and were 2+.  GU/RECTAL: Examinations are deferred due to cardiac cause.  NEUROLOGIC: Normal.  LABORATORY DATA: A 12 lead ECG is normal.  Chest x-ray shows Port-A-Cath.  Laboratories are pending at the time of this dictation.  IMPRESSION:  1. Progressive chest discomfort consistent with unstable angina pectoris,     prolonged episode occurring today.  2. Recent colon cancer, treated with six months of chemotherapy.  3. Peripheral neuropathy by history.  4. Some depression and anxiety occurring during chemotherapy.  5. Possible hyperlipidemia in the past.  PLAN:  1. Admit to telemetry bed.  2. Check serial CPKs and EKGs.  3. Likely will need catheterization for prolonged severe coronary nature     of pain.  The procedure was discussed with the patient fully including     risks of MI, death, or CVA, and he is agreeable and willing to proceed.     Possible risk of angioplasty and stenting was also discussed with him. DD:  05/15/00 TD:  05/16/00 Job: [id number]

## 2010-07-03 NOTE — Assessment & Plan Note (Signed)
MEDICAL RECORD NUMBER:  [id number].   Mark [name] is here regarding his chronic pain and peripheral neuropathy.  He has  undergone further workup regarding his bladder, but he has been fairly  stable with that over the summer.  Lateral washings are on hold at this time  due to potential side effects.  The patient remains on Duragesic patch at 75  mcg/hr, but feels that he is overly fatigued during the day and that he  wants to sleep all of the time.  He is sleeping generally from 10 p.m. to 7  a.m. every day and feels that he wants to sleep more during the middle of  the day.  He denies increase in his depression.  He does not find his  stimulator helpful any further for his foot pain.  He has used some  homeopathic ointments for his feet, which provide him some relief.  He did  not continue with the capsicin due to intolerance.  Cymbalta has helped to a  certain extent, but remains at 60 mg a day.  He has not gone up further due  to increased cost that would mean to him.  The patient remains on Restoril  30 mg q.h.s. and Ativan 1 mg q.h.s. for anxiety and sleep.   The patient rates his pain at a 7-8/10 today.  It improves with rest and  medications.  It is made worse with walking and working.  The patient is  planning to return to work in about two weeks' time.   REVIEW OF SYSTEMS:  The patient denies any chest pain, shortness of breath,  cold, flu, wheezing or coughing symptoms.  He does report weakness,  numbness, dizziness, anxiety and depression.  Denies any hearing or taste  loss or agitation.  Denies headaches.  He does report occasional reflux,  bladder incontinence, painful urination, frequent urination and abdominal  pain.  He reports swelling and breakdown.   PHYSICAL EXAMINATION:  On physical examination today, the blood pressure is  123/86, the pulse is 94, the respiratory rate is 24 and he is saturating 96%  on room air.  The patient walks with a slightly antalgic gait.  He  looked  fairly comfortable today.  His affect was bright.  Appearance was well kept.  The lower extremity examination was stable.  No new weakness noted.  No new  skin changes seen.  Cognitively the patient is appropriate.  The sensory  exam is unchanged.   ASSESSMENT:  1. Peripheral neuropathy.  2. 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  3. Bladder cancer.   PLAN:  1. Will continue with his fentanyl patch for now at 75 mcg q.72h.  May     consider titration downwards in the future.  2. For his foot pain, we prescribed him 4% topical Lidoderm to place on the     feet at bedtime.  Will see how he tolerates this.  Would like to try the     new medication Axsain.  We will see if we can acquire samples of this     medication first.  3. I think the patient could benefit from increase in Cymbalta for use in     his pain.  He has a problem paying for the brand name Cymbalta, however,     at increased.  I would consider switching him over to venlafaxine if this     is okay with his psychiatrist.  Hopefully he can tolerate this up to a  level where it would help with his pain, mood and potential energy as     well.  4. I support having serum testosterone, cortisol and thyroid panel check as     potential sources of his fatigue.  5. Will see the patient back in about two months' time.      Ranelle Oyster, M.D.   ZTS/MedQ  D:  09/30/2003 13:15:11  T:  09/30/2003 14:29:07  Job #:  [id number]   cc:   Valentino Hue. Magrinat, M.D.  [address] [facility]  [place]  [place] [address]  Fax: [phone]   Sigmund I. Patsi Sears, M.D.  [address]  [place]  [place] [address]  Fax: [phone]

## 2010-07-03 NOTE — H&P (Signed)
NAMEHUNT, ZAJICEK NO.:  [id number]   MEDICAL RECORD NO.:  [id number]          PATIENT TYPE:  INP   LOCATION:  5504                         FACILITY:  [facility]   PHYSICIAN:  Jackie Plum, M.D.DATE OF BIRTH:  May 27, 1950   DATE OF ADMISSION:  04/07/2004  DATE OF DISCHARGE:                                HISTORY & PHYSICAL   CHIEF COMPLAINT:  Hyperkalemia.   HISTORY OF PRESENT ILLNESS:  The patient is a 60 year old gentleman with  multiple medical problems who was referred by his primary care physician for  management of hyperkalemia and acute renal failure.  Apparently the patient  has been feeling very loopy with drowsiness, fatigue, and generalized  weakness.  Had gone to see his PCP where upon blood work reviewed increased  BUN and creatinine with a potassium of more than 6.  He was, therefore,  referred to the ED for further management.  The patient denies any history  of chest pain, shortness of breath, cough, fever, chills, lower extremity  swelling, dysuria or frequency of micturition.   PAST MEDICAL HISTORY:  1.  History of irritable bowel syndrome.  2.  History of adenocarcinoma of the sigmoid colon, status post chemotherapy      plus resection five years ago.  3.  History of GERD.  4.  History of chronic prostatitis.  5.  History of bladder cancer, status post chemotherapy.  6.  Eight months ago, __________ of the face.  7.  History of peripheral neuropathy.  8.  Anxiety.  9.  Depression.  10. Status post transurethral resection of the bladder in March 2005.  11. History of left hydronephrosis in March 2005.  12. History of incontinence.   MEDICATIONS:  1.  Temazepam 30 mg q.h.s.  2.  Protonix 40 mg daily.  3.  Cymbalta 15 mg two tablets daily.  4.  Aspirin 81 mg daily.  5.  Multivitamin one tablet daily.  6.  Proscar 5 mg daily.  7.  Duragesic patch 50 mcg patch every 72 hours.   FAMILY HISTORY:  Both parents died before age 43.  He  said they used to  smoke a lot, however, the cause of death is unclear.   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  Does not smoke cigarettes or drink alcohol.   REVIEW OF SYSTEMS:  As in HPI otherwise unremarkable.   PHYSICAL EXAMINATION:  GENERAL:  A middle-aged gentleman who was in no acute  cardiopulmonary distress.  HEENT:  Normocephalic, atraumatic.  Pupils were equal, round, and reactive  to light.  He had mild conjunctival pallor.  No icterus.  Oropharynx was  slightly dry.  NECK:  Supple.  No JVD.  LUNGS:  Vesicular breath sounds.  No crackles.  Air entry was adequate.  CARDIAC:  Regular.  No gallops.  ABDOMEN:  Soft, nontender.  No evidence of urinary retention but the bladder  could not be palpated.  EXTREMITIES:  No cyanosis.  No edema.  CNS:  The patient is alert, slightly lethargic.  __________.   LAB WORK:  WBC  count of 6.9, hemoglobin 11.7, hematocrit 33.2, MCV 83.9,  platelet count 176.  B-MET, after being administered with D-50 water,  calcium chloride, and __________ of Kayexalate came out to be 137 for  sodium, potassium 5.3, chloride 109, CO2 19, glucose 64, BUN 74, creatinine  5.8, calcium 9.4.   IMPRESSION:  1.  Hyperkalemia, improved.  2.  Acute renal failure rule out obstructive uropathy.   PLAN:  1.  Admit the patient to the hospital.  2.  He has already received treatment for hyperkalemia as mentioned above.      He will need ongoing followup of his potassium and further treatment to      bring it to normal limits.  3.  We will get a renal ultrasound to rule out any bladder outlet      obstruction.  4.  We will also get urinary sediment and 24 hour urine collection for      creatinine clearance amongst others.  '  5.  Nephrology has been consulted and will be following up with the patient.      If any evidence of bladder outlet obstruction is found, the patient      would be followed by urology who has seen the  patient previously.      GO/MEDQ  D:  04/07/2004  T:  04/07/2004  Job:  [id number]   cc:   Tiburcio Pea, Dr.

## 2010-07-03 NOTE — Consult Note (Signed)
   NAME:  Mark [name], Mark [name]                         ACCOUNT NO.:  [id number]   MEDICAL RECORD NO.:  [id number]                   PATIENT TYPE:  REC   LOCATION:  TPC                                  FACILITY:  [facility]   PHYSICIAN:  Sondra Come, D.O.                 DATE OF BIRTH:  12-23-1950   DATE OF CONSULTATION:  01/05/2002  DATE OF DISCHARGE:                                   CONSULTATION   HISTORY OF PRESENT ILLNESS:  The patient returns to the clinic today as  scheduled for reevaluation.  He was last seen on November 30, 2001.  The  patient is doing rather well in terms of his peripheral neuropathic pain.  He rates his pain as a 4-6/10 on a subjective scale and states that it is  mainly tolerable with methadone 5 mg 1-2 per day.  He brings his methadone  pills with him today and has 70 pills remaining from a prescription for 90,  filled approximately one month ago.  He also feels like the nortriptyline 20  mg at bedtime has been helpful as well.  I reviewed the health and history  form and 14-point review of systems.  No new neurologic complaints.   PHYSICAL EXAMINATION:  VITAL SIGNS:  Blood pressure is 136/78, pulse 109,  respirations 18, O2 saturation 97% on room air.  NEUROLOGIC:  Manual muscle testing is 5/5 bilateral lower extremities.  Sensory examination is intact to light touch bilateral lower extremities.  Muscle stretch reflexes are symmetric bilateral lower extremities.   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   PLAN:  1. Continue methadone 5 mg 1-2 per day as needed.  The patient has enough     pills remaining for one to two months.  2. Continue nortriptyline 20 mg at bedtime for sleep and neuropathic pain.     The patient has a refill remaining on this, and he may call for refills     in this regard.  3. Patient to return to clinic in six weeks for reevaluation or soon as     needed.   The patient was educated on the above findings and  recommendations and  understands.  There were no barriers to communication.  This was a 15-minute  consultation.                                               Sondra Come, D.O.    JJW/MEDQ  D:  01/05/2002  T:  01/06/2002  Job:  [id number]

## 2010-07-03 NOTE — Consult Note (Signed)
Mark [name], Mark [name] NO.:  [id number]   MEDICAL RECORD NO.:  [id number]          PATIENT TYPE:  INP   LOCATION:  5504                         FACILITY:  [facility]   PHYSICIAN:  Mark [name], M.D.  DATE OF BIRTH:  1950/10/23   DATE OF CONSULTATION:  04/07/2004  DATE OF DISCHARGE:                                   CONSULTATION   REASON FOR CONSULTATION:  1.  Renal insufficiency.  2.  Hydronephrosis.  3.  History of carcinoma of the bladder.   HISTORY OF PRESENT ILLNESS:  This 60 year old patient is a long standing  patient of Dr. Imelda [name]. The patient had problems with transitional cell  carcinoma of the bladder and underwent TURBT in the past. The patient  apparently had an episode of extravasation of Mitomycin C that was given  intravesically following a bladder resection and this caused damage to the  retroperitoneum including the ureter and colon. He has had to undergo a  colon resection to try to take care of this problem and has had problems  with chronic pelvic pain and urinary incontinence ever since. The patient  recently switched his care to me because I have taken care of his wife and  we have begun to evaluate this problem. I do not have access to his office  notes and am doing this dictation from memory. The patient has been recently  evaluated with cystoscopy as well as urodynamics. He has got no evidence of  any recurrence of his transitional cell carcinoma. Urodynamics shows a very  small contracted high pressure bladder and a recent ultrasound showed the  beginnings of hydronephrosis. I reviewed this with the patient and told him  that we needed to consider some form of urinary diversion in order to  eliminate the problem of hydronephrosis due to the high pressure system. The  patient has been very reluctant to consider surgical diversion. We have  concerns about this as well because he does have problems with his bowels  and it has been  unclear whether there is going to be adequate bowel for  reconstruction to be performed. We did discuss the possibility of doing a  transurethral prostate resection to eliminate some bladder outlet resistance  and then consider the possibility of augmentation or substitution  cystoplasty at a later date. The patient began to feel ill, however, and had  some laboratory studies done which showed a creatinine that had risen up to  5.8. An ultrasound obtained at the time he was brought to the hospital,  showed that the hydronephrosis has worsened and that he is now being  admitted to the hospital for treatment of his hyperkalemia.   PAST MEDICAL HISTORY:  Remarkable for his bladder cancer, his history of  colon cancer, and the episodes that occurred after his bladder cancer  surgery in March of this year. The patient did have stents placed. We note  also the colon resection in 2001 for colon cancer. He has had a  cholecystectomy. He has had a neurostimulator placed to try and treat a  peripheral neuropathy, although this is  not an inner stem, but is a  different type of a stimulator. The patient really has severe chronic pelvic  pain that has been unrelieved by therapy and has the significant problems  with peripheral neuropathy. Aside from the bladder cancer, colon cancer, and  neuropathy, he is noted to have some degree of bladder outlet obstruction.  He does have a past history of skin lesions and had resection of that in the  past.   SOCIAL HISTORY:  Unremarkable. He does not use tobacco or alcohol. 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   CURRENT MEDICATIONS:  __________ 1 mg at night, Prevacid 40 mg daily,  Metoprolol 50 mg b.i.d., Balta which may be contributing to some of his  bladder outlet obstruction but has been helpful for his neuropathy. Baby  aspirin 1 daily, Lyrica 100 mg at night, multivitamins, Duragesic patch, and  Proscar to try and shrink his prostate.    PHYSICAL EXAMINATION:  GENERAL:  A well nourished male who is a little bit  less confused than he had been, now that some of the potassium has been  removed.  VITAL SIGNS:  Temperature 92, pulse 84, respiratory rate 20, blood pressure  134/80.  HEENT:  Normocephalic and atraumatic. Cranial nerves 2-12 are grossly  intact.  NECK:  No adenopathy or thyromegaly.  ABDOMEN:  No palpable masses, rebound, or guarding.  RECTAL:  He just had a recent rectal examination that shows a 2 to 3+  prostate that is not repeated.  EXTREMITIES:  The patient's peripheral examination is pertinent for his  neuropathy, which effects the bottoms of both feet.   IMPRESSION:  I had a very long and involved discussion with the patient and  with his wife, Mark [name] and have come up with the following plan.   PLAN:  1.  He needs bilateral percutaneous nephrostomy tubes placed immediately.      This will decompress the kidneys and allow [place] to gain control of his      upper tract.  2.  Nephrostomy scans will need to be done to determine if the level of the      obstruction is within the ureter or if it is all at the bladder outlet.  3.  If the problem is exclusive at the bladder outlet, the bare fact that he      does have a high pressure system with very poor compliance of the      bladder as well as the past history of carcinoma of the bladder, because      I think it is very likely that the patient will have to undergo      cystectomy and some form of urinary diversion. Either a neo-bladder, a      right colon based neo-bladder along the lines of the LeBag procedure, or      possibly just an ileoconduit. It truly depends on the quality of the      valve that is visible when he undergoes exploration and if the valves      are reasonable quality, a reconstruction would be attempted.  The      patient does need for erectile difficulties and may be very difficult to     preserve the nerve function __________ present in  his retroperitoneum      from the Mitomycin leak.  I did stress to the patient and his wife      today, that we are far beyond the  point of having to worry about things      like erections and really at this point, need to do whatever is      necessary to regain good control of his renal function and preserve      that. I will see the patient back tomorrow after the percutaneous tubes      have been placed and will follow his BUN and creatinine. Once the      __________ have normalized, we will order appropriate nephrostogram      studies as well as a cystogram, to check for reflux and then will make a      decision as to how best to proceed with surgical reconstruction. This      can be done at a later time with the appropriate instrumentation for      urologic surgery here at [facility].      RJE/MEDQ  D:  04/07/2004  T:  04/08/2004  Job:  [id number]   cc:   L. Lupe Carney, M.D.  [address] [place]  [place] [address]  Fax: [phone]

## 2010-07-03 NOTE — Consult Note (Signed)
NAME:  Mark [name], Mark [name]                         ACCOUNT NO.:  [id number]   MEDICAL RECORD NO.:  [id number]                   PATIENT TYPE:  OUT   LOCATION:  XRAY                                 [facility]ACILITY:  [facility]   PHYSICIAN:  Sondra Come, D.O.                 DATE OF BIRTH:  01/21/1951   DATE OF CONSULTATION:  10/12/2001  DATE OF DISCHARGE:  10/10/2001                                   CONSULTATION   The patient returns to clinic today as scheduled for reevaluation and  possible repeat lumbar epidural steroid injection.  He was last seen on  September 28, 2001 at which time he underwent a lumbar epidural steroid  injection with the goal of enhancing pain control secondary to peripheral  neuropathy with central pain component.  The patient states that he did not  notice any significant improvement with his pain following one epidural  steroid injection and wishes to proceed with a second.  I reviewed health  and history form and 14 point review of systems.  The patient's pain is an  8/10 on a subjective scale involving his feet bilaterally secondary to  chemotherapy.  Function and quality of life indexes remain stable.  He  continues on hydrocodone sparingly which he states takes the edge off, but  does not control his pain totally.  He has had no significant changes in his  neurologic status.  We had a long discussion regarding further options and I  discussed repeating the epidural steroid injection with him.  He understands  that the epidural injection may not relieve his pain but he wants to proceed  nonetheless.   PHYSICAL EXAMINATION:  GENERAL:  Healthy male in no acute distress.  VITAL SIGNS:  Blood pressure 142/81, pulse 92, respirations 20, O2  saturation 97% on room air.   IMPRESSION:  Peripheral neuropathy secondary to chemotherapy likely with  central component.   PLAN:  1. Lumbar epidural steroid injection to decrease central pain component.  2. Continue Lortab 7.5 mg  as needed.  3. The patient is to return to clinic in two weeks for reevaluation,     possible repeat injection as predicated upon patient's response and     symptoms.  4. Consider further medication management including addition of Gabitril.     The patient has previously been on Neurontin without any improvement.   PROCEDURE:  Lumbar epidural steroid injection.  Procedure was described to  patient in detail including risks, benefits, limitations, and alternatives  and patient understands and wishes to proceed.  Informed consent was  obtained.  The patient was brought back to the fluoroscopy suite and placed  on the table in prone position.  Skin was prepped and draped in usual  sterile fashion.  Skin and subcutaneous tissues were anesthetized with 3 cc  of preservative-free 1% lidocaine.  Under direct fluoroscopic guidance an 18-  gauge 3.5 inch  Tuohy needle was advanced into the left paramedian L5-S1  epidural space with loss of resistance technique.  No CSF, heme, or  paraesthesias were noted.  Needle tip placement was further confirmed with  the injection of 1 cc of Omnipaque revealing appropriate epidurogram without  vascular uptake.  This was then followed by the injection of 1.5 cc of  Kenalog 40 mg/cc plus 2.5 cc of normal saline with needle flush.  There were  no complications.  The patient tolerated the procedure well.  Discharge  instructions given.   The patient was educated on the above findings and recommendations and  understands.  No barriers to communication.                                               Sondra Come, D.O.    JJW/MEDQ  D:  10/12/2001  T:  10/12/2001  Job:  [id number]   cc:   Valentino Hue. Magrinat, M.D.  [address] [facility]  [place]  [place] [address]  Fax: [phone]

## 2010-07-03 NOTE — Assessment & Plan Note (Signed)
Mark [name] is back regarding his chronic pain.  His pain still ranges from a 6  to 7 plus out of 10.  He is getting out in the community.  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  He finds the analgesic cream we prescribed  helpful at nighttime.  He has a hard time wearing it during the day with  socks, shoes, etcetera.   He has made no changes in his Lyrica, Cymbalta  or Ultram recently.   His insurance will give him a discount if he uses generic medications and he  had questions about switching to a generic form of Cymbalta today.  Sleep is  fair without Restoril and Ativan.   Pain in the feet is described as sharp, stabbing, aching.  It interferes  with general activity, relations with others, and enjoyment of life on a  moderate to severe level.  Mark [name] had questions about acupuncture today.   REVIEW OF SYSTEMS:  The patient reports bladder control problems, bowel  control issues related to his past history, trouble walking, dizziness,  constipation, poor appetite, limb swelling, shortness of breath.  Full  review is in the health and history section.   SOCIAL HISTORY:  The patient is married and living with his wife.  His wife  has been out of town tending to family affairs in [place].   PHYSICAL EXAMINATION:  VITAL SIGNS:  Blood pressure is 139/82, pulse is 70,  respiratory rate 18.  He is sating 98% on room air.  GENERAL:  The patient is pleasant in no acute distress.  He is alert and  oriented  x3.  Affect is bright and appropriate.  MUSCULOSKELETAL:  Gait remains slightly wide based but he is steady and is  able to change directions without difficulty.  Less antalgic gait is seen  today.  Reflexes are 1+.  Sensation is decreased in the distal limbs to  pinprick and light touch.  Motor function is 4+ to 5 out of 5.  HEART:  Regular rate.  CHEST:  Clear  ABDOMEN:  Soft nontender.  MENTAL STATUS:  Cognitively he is appropriate with a normal cranial nerve  exam today.   ASSESSMENT:  1. 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  2. History of bladder cancer, status post bladder, prostate, ureter, and      appendix removal.  3. Active urostomy.  4. Reactive depression.   PLAN:  1. We will increase the patient's Elavil to 4% in his topical cream,      leaving him 2% baclofen 20% ketoprofen.  The patient will apply this      b.i.d.  2. Fentanyl patch 50 mcg every 72 hours.  3. Lyrica 50 mg b.i.d.  4. Cymbalta will be changed to Effexor 75 mg t.i.d. to accommodate his      insurance needs.  5. Ultram 50 mg daily p.r.n. for breakthrough pain.  6. Encouraged activity as tolerated.  7. We will make a referral to Dr. Wynn Banker for evaluation for potential      acupuncture.  We discussed the pros and      cons of this today and the patient would like to proceed with this      referral.  8. I will see the patient back in about 3 months' time.      Ranelle Oyster, M.D.  Electronically Signed     ZTS/MedQ  D:  10/27/2005 11:11:07  T:  10/27/2005 22:22:06  Job #:  [id number]

## 2010-07-03 NOTE — Assessment & Plan Note (Signed)
MEDICAL RECORD NUMBER:  [id number]   HISTORY OF PRESENT ILLNESS:  Mark [name] is here for his chronic pain and  peripheral neuropathy.  He reports ongoing pain in the 7-9/10 range,  generally in the feet.  Sleep has been poor at times.  He feels that the  Cymbalta has helped him to a certain extent.  He tries to get into the  hospital once a week or so to work.  He has had some problems with  increasing incontinence and daytime fatigue and decreased memory.  Remains  on Duragesic patch 75 mcg an hour, as well as Endocet q.6h. p.r.n. for  breakthrough pain.  He uses Restoril 30 mg q.h.s. and Ativan 400 mg q.h.s.  for anxiety and sleep.  Lidoderm patches are used occasionally as well.   REVIEW OF SYSTEMS:  The patient denies any new chest pain, shortness of  breath, cold, flu, wheezing or coughing symptoms.  He does report dizziness,  confusion, blurred vision, anxiety/depression, problems with sleep, reflux,  constipation, bladder incontinence, urinary retention, dysuria, abdominal  pain, skin breakdown.   PHYSICAL EXAMINATION:  VITAL SIGNS:  Blood pressure 155/85, pulse 86,  respirations 16, saturating 100% on room air.  NEUROLOGICAL:  the patient is generally alert and appropriate.  Appearance  is well kept.  He  continues to walk with antalgic gait pattern.  He  generally looks comfortable in appearance today.  No new weakness seen in  the lower extremities.  Sensory exam is stable.  Cognitively, the patient is  a bit distractible, but overall appropriate.   ASSESSMENT:  1.  Peripheral neuropathy with neuropathic pain.  2.  Bladder cancer.   PLAN:  1.  Likely to start the patient on Lyrica for his neuropathic pain.  We will      get him on 50 mg t.i.d. and then titrate up to 150 mg b.i.d. after one      week's time.  I would also like to decrease some of his other sedating      medications such as his Lorazepam and Restoril.  2.  Will continue with Cymbalta, for now, 120 mg daily  dose.  3.  Continue Duragesic patch 75 mcg daily.  4.  Will check a morning testosterone, cortisol and thyroid panel.  5.  See the patient back in one month time.       ZTS/MedQ  D:  11/27/2003 12:08:01  T:  11/27/2003 12:34:17  Job #:  [id number]   cc:   Valentino Hue. Magrinat, M.D.  [address] [facility]  [place]  [place] [address]  Fax: [phone]

## 2010-07-03 NOTE — Consult Note (Signed)
[facility]  Patient:    Mark [name], Mark [name] Visit Number: [id number] MRN: [id number]          Service Type: PMG Location: [facility] Attending Physician:  Sondra Come Dictated by:   Sondra Come, D.O. Proc. Date: 06/23/01 Admit Date:  06/21/2001   CC:         Valentino Hue. Magrinat, M.D.   Consultation Report  Dear Dr. Darnelle Catalan:  Thank you very much for kindly referring [name] to [facility] for evaluation.  Patient was evaluated in our clinic today.  Please refer to the following for details regarding the history, physical examination, and treatment plan.  Once again, thank you for allowing [place] to participate in the care of Mark [name].  CHIEF COMPLAINT:  Peripheral neuropathy.  HISTORY OF PRESENT ILLNESS:  Mark [name] is a pleasant 60 year old right-hand dominant male with a history of colon cancer approximately two years ago who was treated with ______.  He finished his course of chemotherapy approximately seven months ago.  He states he developed neuropathy in his feet and hands after he finished his course of chemotherapy but his hand numbness and paraesthesias have since resolved.  He states that his feet stay "cold like they are in a block of ice."  They do not seem to be improving.  He states he was treated with Neurontin, but felt like it was hurting his feet worse and he discontinued this on his own approximately one month ago.  He continues to take hydrocodone 7.5 mg b.i.d. which he states helps considerably, especially helping him function.  He describes his pain as constant, throbbing, and sharp with a pain level of 8/10 on a subjective scale.  His symptoms are worse with walking and working and improved with rest and medications.  His function and quality of life indexes have declined, although he states they are improved with the hydrocodone.  His sleep is fair on Ambien 10 mg.  He has used his  wifes TENS unit which helps him occasionally but he has some time constraints.  He has tried Elavil for sleep which causes him to be "spaced out."  I review health and history form and 14 point review of systems.  Patient denies any bowel and bladder dysfunction. Denies fever, chills, night sweats, or weight loss.  PAST MEDICAL HISTORY:  Colon cancer.  PAST SURGICAL HISTORY:  Cholecystectomy, cancer surgery followed by six months of chemotherapy.  FAMILY HISTORY:  Heart disease.  SOCIAL HISTORY:  Patient denies smoking or alcohol use.  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  ALLERGIES:  PENICILLIN.  MEDICATIONS: 1. Hydrocodone 7.5 mg b.i.d. 2. Celexa 20 mg. 3. Ambien 10 mg. 4. Lorazepam 1 mg. 5. Allegra. 6. Prednisone 5 mg. 7. Nexium.  PHYSICAL EXAMINATION  GENERAL:  Healthy male in no acute distress.  BACK:  Level pelvis.  No scoliosis.  Range of motion is full.  NEUROLOGIC:  Manual muscle testing is 5/5 bilateral upper and lower extremities.  Sensory examination is intact to light touch bilateral upper extremities with mild decreased light touch in the toes bilaterally.  There is no allodynia or hyperpathia noted.  Muscle stretch reflexes are 1+/4 bilateral biceps, triceps, brachioradialis, pronator tares, patellar, medial hamstrings, and Achilles.  EXTREMITIES:  No heat, erythema, or edema in the lower extremities.  Distal pulses are present bilaterally.  No skin breakdown noted.  IMPRESSION:  Peripheral neuropathy secondary to chemotherapy.  PLAN: 1. Had a thorough discussion with Mark [name] regarding treatment options.  At    this point I think it is reasonable to continue with hydrocodone 7.5 mg    b.i.d. as this helps significantly with his pain and just as important as    helping with his functional abilities.  The ultimate goal is to wean him    from this medication as symptoms improve.  He is in agreement with this.     He does have a refill remaining on his current prescription. 2. Will begin Humibid DM one p.o. b.i.d. #60 with one refill.  This will help    take advantage of the NMDA receptor antagonistic effects of    dextromethorphan.  I discuss this rationale with the patient. 3. Recommend capsaicin cream 0.025% to be applied three to four times a day. 4. Consider adding topamax 25 mg. 5. If patient continues to require narcotic based pain medication for his    symptoms, would consider transitioning him over to methadone as this also    has some NMDA receptor antagonistic effects. 6. Patient to follow up in one month for reevaluation.  Patient was educated on the above findings and recommendations and understands.  There were no barriers to communication. Dictated by:   Sondra Come, D.O. Attending Physician:  Sondra Come DD:  06/23/01 TD:  06/26/01 Job: [id number]

## 2010-07-03 NOTE — Consult Note (Signed)
NAME:  Mark [name], Mark [name]                         ACCOUNT NO.:  [id number]   MEDICAL RECORD NO.:  [id number]                   PATIENT TYPE:  REC   LOCATION:  [facility]                                  FACILITY:  [facility]   PHYSICIAN:  Sondra Come, D.O.                 DATE OF BIRTH:  [birth year]   DATE OF CONSULTATION:  DATE OF DISCHARGE:                  PHYSICAL MEDICINE & REHABILITATION CONSULTATION   HISTORY OF PRESENT ILLNESS:  The patient returns to clinic today as  scheduled for reevaluation and trial of lumbar steroid injections to enhance  pain control secondary to peripheral neuropathy due to chemotherapy likely  with central pain component.  Patient denies any new neurological  complaints.   I reviewed Health and History form and 14-point review of systems.   PHYSICAL EXAMINATION:  GENERAL:  Healthy male in no acute distress.  VITALS:  Blood pressure 152/83, pulse 82, respirations 18, O2 saturations  97% on room air.   IMPRESSION:  1. Peripheral neuropathy secondary to chemotherapy, atypical presentation     likely with central component.   PLAN:  1. Lumbar steroid injection to decrease central pain component.  2. Continue Lortab 7.5 mg as needed.  3. Patient to return to clinic in two weeks for reevaluation and possible     repeat injection depending on patient's response to symptoms.   PROCEDURE:  Lumbar steroid injection.  Procedure described to the patient in  detail including risks, benefits and the patient's alternatives.  Patient  wishes to proceed.  Informed consent was obtained.  Patient was brought back  to the Fluoroscopy Suite and placed on the table in prone position.  The  skin was prepped and draped in the usual sterile fashion.  The skin and  subcutaneous tissues were anesthestized with 3 cc of preservative free 1%  lidocaine . Under direct fluoroscopic guidance, an 18-gauge 3.5 inch Tuohy  spinal needle was advanced into the right paramedian L5-S1 space  with loss-  of-resistance technique.  No CSF or paresthesias were noted.  Needle tip  placement was further confirmed with the injection of 1 cc of Omnipaque  revealing appropriate epidurogram without vascular uptake.  This was then  followed by the injection of 1.5 cc of Kenalog 40 mg per cc plus 2.5 cc of  normal saline with needle flush.  There were no complications.  The patient  tolerated the procedure well.  Discharge instructions were given.   Patient was educated on above findings and recommendations and understands.  There were no barriers to communication.                                                Sondra Come, D.O.    JJW/MEDQ  D:  09/28/2001  T:  09/30/2001  Job:  [id number]   cc:  Valentino Hue. Magrinat, M.D.  [address] [facility]  [place]  [place] [address]  Fax: [phone]

## 2010-07-03 NOTE — Discharge Summary (Signed)
NAMESEMIR, BRILL               ACCOUNT NO.:  [id number]   MEDICAL RECORD NO.:  [id number]          PATIENT TYPE:  INP   LOCATION:  5527                         FACILITY:  [facility]   PHYSICIAN:  Kela Millin, M.D.DATE OF BIRTH:  [birth year]   DATE OF ADMISSION:  04/07/2004  DATE OF DISCHARGE:  04/18/2004                                 DISCHARGE SUMMARY   DISCHARGE DIAGNOSES:  1.  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  2.  Bilateral hydronephrosis.  3.  Hyperkalemia - secondary to number one.  4.  History of bladder cancer - status post chemotherapy.  5.  Urinary tract infection, Staphylococci species.  6.  History of adenocarcinoma of the sigmoid colon, status post surgical      resection and chemotherapy.  7.  History of chronic prostatitis.  8.  History of gastroesophageal reflux disease.  9.  History of peripheral neuropathy.  10. History of anxiety/depression.  11. History of left hydronephrosis - March, 2005.  12. History of incontinence.   CONSULTATIONS:  1.  Nephrology.  2.  Urology, Dr. Logan Bores.  3.  Interventional radiology.   STUDIES/PROCEDURES:  1.  Renal ultrasound - bilateral hydronephrosis, cause of obstruction not      visualized on this study.  2.  Status post percutaneous nephrostomy tube placement with ultrasound      guidance per interventional radiology.  3.  Status post bilateral nephrostogram and bilateral double-J ureteral      stent placement on April 15, 2004.   HISTORY:  The patient is a 60 year old white male with multiple medical  problems who was referred by his primary care physician for inpatient  management of his hyperkalemia and acute renal failure. The patient reported  that he had been feeling very drowsy and fatigued with  generalized  weakness. He had gone to see his primary care physician and blood work  revealed increased BUN and creatinine with a potassium of greater than 6. He  was referred to the emergency  room for further management. The patient  denied chest pain, shortness of breath, cough, fevers, chills, lower  extremity swelling, dysuria, urinary frequency.   PHYSICAL EXAMINATION ON ADMISSION:  He is a middle-aged white male in no  acute distress. Pertinent findings on exam were on HEENT exam he was noted  to have mild conjunctival pallor, oropharynx was slightly dry. His lungs  were clear to auscultation bilaterally. His abdomen was soft, nontender, no  evidence of urinary pretension but the bladder could not be palpated.  On  examination of extremities there was no cyanosis, no edema. On neuro exam he  was noted to be slightly lethargic.   LABORATORY DATA:  His white cell count was 6900, hemoglobin 11.7, hematocrit  33.2, MCV 83.9, platelet count 176,000. After receiving IV fluids and  calcium chloride as well as Kayexalate, his BMET showed a sodium of 137 with  a potassium of 5.3, chloride 109, CO2 19, glucose 64, BUN 74, creatinine  5.8, calcium 9.4.   HOSPITAL COURSE:  PROBLEM 1. Hyperkalemia - the patient received Insulin,  D50, calcium and  Kayexalate in the emergency room. His potassium was  monitored during his hospital stay and his last potassium today prior to  discharge is 3.5.  The hyperkalemia as indicated above was secondary to the  acute renal failure.   PROBLEM 2.  Acute renal failure, secondary to obstructive nephropathy -  urology was consulted upon admission and Dr. Logan Bores saw the patient. He  recommended immediate bilateral percutaneous nephrostomy tubes to decompress  the kidneys and also allow gain of control of his upper tract.  Nephrostograms were also done to determine if the level of obstruction was  within the ureter or if it was at the bladder outlet. Dr. Logan Bores followed the  patient during the hospital stay and following the percutaneous nephrostomy  tubes, the patient had double-J stents placed and his creatinine continued  to gradually decrease. His  last creatinine today prior to discharge is 2.5  which is improved from 5.8 on presentation.   PROBLEM 3.  Urinary tract infection - the patient began complaining of  dysuria in the hospital following percutaneous placement of his nephrostomy  tubes and this worsened after the J-stents were placed. A urinalysis was  done and also cultures and the patient was initially started on Pyridium for  symptomatic relief. The urine cultures grew a staph species and given that  the patient was symptomatic, he was treated with antibiotics. The  sensitivities showed that it was resistant to Levaquin. The patient has been  on Rocephin in the hospital, tolerating it well, is afebrile with a decrease  in dysuria and no leukocytosis - his white cell count today is 8100.  He  will be discharged on oral Ceftin to complete a 2-week course of  antibiotics. The patient is to follow up with his primary care physician as  well as Dr. Logan Bores.   PROBLEM 4. History of anxiety/depression - the patient was maintained on  Cymbalta during his hospital stay.   PROBLEM 5. Hypertension - the patient was maintained on Metoprolol and  Norvasc during his hospital stay.   PROBLEM 6. Gastroesophageal reflux disease - the patient was maintained on  Protonix during his hospital stay.   I talked with Dr. Vernie Ammons today who is covering for Dr. Logan Bores this weekend  and he agrees with discharging the patient today. To follow up with Dr.  Logan Bores in 1 week.   DISCHARGE MEDICATIONS:  1.  Ceftin 500 mg one p.o. b.i.d. x11 days.  2.  Ultram 50 mg one p.o. q.6h p.r.n.  3.  Pyridium 200 mg one p.o. t.i.d. p.r.n. x2 days.  4.  Continue preadmission medications.   FOLLOW UP CARE:  1.  The patient is to follow up with Dr. Logan Bores in 1 week.  2.  The patient is to follow up with primary care physician, Dr. Tiburcio Pea in 1-      2 weeks or as scheduled.   CONDITION ON DISCHARGE:  Improved/stable.      ACV/MEDQ  D:  04/18/2004  T:   04/18/2004  Job:  [id number]   cc:   Holley Bouche, M.D.  [address]  [place], [place] [address]  Fax: [phone]   Jamison Neighbor, M.D.  [address]  [place]  [place] [address]  Fax: [phone]

## 2010-07-03 NOTE — Assessment & Plan Note (Signed)
Mark [name] is back regarding his peripheral neuropathy.  He had initially good  results with the spinal stimulator, but the pain has now really returned and  back to previous levels.  It is running at a 6-8/10.  The pain remains at  the ankles and feet.  It is worse the more he walks on it.  He can only walk  on his feet about three-and-a-half hours a day.  The pain is much better  with rest.  He notes that Ultram has helped him, although he is only using  really one to two of these a day.  He stopped the Topamax.  He continues on  Cymbalta 60 mg daily.  He was unable to titrate his fentanyl patch downward  and maintains this at 75 mcg currently every 72 hours.   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  He  wonders whether he should apply for disability at this point.  He is  frustrated at having to deal with this for so long.  It has been essentially  three-and-a-half years now.   REVIEW OF SYSTEMS:  The patient denies any chest pain, palpitations,  shortness of breath, cold, or cough.  No weakness, numbness, spasms, blurred  vision, or anxiety.  His mood is bit down.  Sleep is impaired at times and  requires him to use his tetracycline and Restoril, as well as Ativan.  He  denies any bowel or bladder complaints.   PHYSICAL EXAMINATION:  On physical examination today, the patient is  pleasant and in no acute distress.  The blood pressure is 111/73, the pulse  is 90, and saturating 97% on room air.  Gait is fairly normal, although he  walks a bit gingerly.  The patient is alert and oriented x 3.  No focal skin  changes are noted.  He has normal motor exam today.  Sensory exam is  unchanged.   ASSESSMENT:  1. Peripheral neuropathy.  2. 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   PLAN:  1. Will continue his fentanyl patch at 75 mcg/hr q.72h.  2. He uses Ultram for breakthrough pain and I encouraged him to use three to     four a days as needed.  3. We will increase his Cymbalta at 120 mg a day.  I gave him a sample     voucher today.  4. We will send him up with anodyne treatments per Genevieve Norlander if this can be     arranged.  If not, may consider an outpatient treatment if we can find an     available location for this.  5. May consider some other topical treatments, including morphine cream and     __________ which is a new topical     Capzasin and lidocaine ointment.  6. We will see the patient back in approximately four weeks' time.      Ranelle Oyster, M.D.   ZTS/MedQ  D:  03/20/2003 11:26:44  T:  03/20/2003 16:10:96  Job #:  [id number]   cc:   Valentino Hue. Magrinat, M.D.  [address] [facility]  [place]  [place] [address]  Fax: [phone]   Danae Orleans. Venetia Maxon, M.D.  [address]  [place]  [place] [address]  Fax: [phone]

## 2010-07-03 NOTE — Op Note (Signed)
[place]  Patient:    [name], [name]                      MRN: [id number] Proc. Date: 10/22/99 Adm. Date:  [id number] Attending:  Glenna Fellows Tappan                           Operative Report  PREOPERATIVE DIAGNOSES: 1. Carcinoma of the colon. 2. Poor venous access.  POSTOPERATIVE DIAGNOSES: 1. Carcinoma of the colon. 2. Poor venous access.  PROCEDURE:  Placement of Life Port via right subclavian vein.  SURGEON:  Lorne Skeens. Hoxworth, M.D.  ANESTHESIA:  Local with IV sedation.  INDICATIONS:  Mark [name] is a 60 year old white male with a recent diagnosis of colon cancer.  He will require long-term venous access for chemotherapy and has poor peripheral access.  Placement of a subcutaneous venous port has been recommended and accepted.  The nature of the procedure, its indications, and risks of bleeding, infection, thrombosis, and pneumothorax were discussed and understood.  He is now brought to the operating room for this procedure.  DESCRIPTION OF PROCEDURE:  The patient was brought to the operating room and placed in the supine position on the operating table.  IV sedation was administered.  Antibiotics were given preoperatively.  The right neck, chest, and shoulder were sterilely prepped and draped.  Local anesthesia was used to infiltrate the infraclavicular area on the right and the site for the port on the anterior chest wall.  The right subclavian vein was cannulated with needle and guide wire and confirmed by fluoroscopy.  The introducer was easily passed over the guide wire.  The flushed catheter was then placed via the introducer and its tip positioned in the superior vena cava, confirmed by fluoroscopy.  A transverse incision was made on the chest wall and a small subcutaneous pocket created.  The catheter was tunneled subcutaneously into the pocket, trimmed to length, and attached to the port, which was then sutured  to the anterior chest wall with 3-0 interrupted Prolene sutures.  The incisions were closed with interrupted subcutaneous 4-0 Monocryl, running subcuticular 4-0 Monocryl, and Steri-Strips.  The catheter flushed and aspirated easily and was flushed with concentrated heparin solution.  The sponge, needle, and instrument counts were correct.  A dry sterile dressing was applied and the patient was taken to the recovery room in good condition. DD:  10/22/99 TD:  10/22/99 Job: [id number]

## 2010-07-03 NOTE — Op Note (Signed)
NAME:  Mark [name], Mark [name]                         ACCOUNT NO.:  [id number]   MEDICAL RECORD NO.:  [id number]                   PATIENT TYPE:  INP   LOCATION:  0373                                 FACILITY:  [facility]   PHYSICIAN:  Sigmund I. Patsi Sears, M.D.         DATE OF BIRTH:  1950-12-19   DATE OF PROCEDURE:  05/15/2003  DATE OF DISCHARGE:                                 OPERATIVE REPORT   PREOPERATIVE DIAGNOSES:  1. Left lower quadrant pain.  2. Left hydronephrosis on week status post TUR of bladder tumor.   POSTOPERATIVE DIAGNOSIS:  Edema left ureterovesical junction.   OPERATION:  1. Cystogram.  2. Cystoscopy.  3. Bilateral retrograde pyelogram.  4. Left double-J catheter.   SURGEON:  Sigmund I. Patsi Sears, M.D.   ASSISTANTTyson Alias, N.P.-C.   PREPARATION:  After appropriate preanesthesia, the patient is brought to the  operating room and placed on the operating table in the dorsal supine  position where general LMA anesthesia was introduced.  He was then re-placed  in the dorsal lithotomy position where the pubis was prepped with Betadine  solution and draped in the usual fashion.   REVIEW OF HISTORY:  This 60 year old married, white, male is 8 days status  post left side TUR bladder tumor with cold cup bladder biopsies of  additional low-grade tumor.  The patient was treated with 1 dose of  Mitomycin C at the end of his procedure and was sent home with Foley  catheter in place.  Following his surgery, he did well until 2 days postop,  when he developed left upper quadrant and left lower quadrant pain and  bloating.  He has not had a normal bowel movement since his surgery, was  treated with cathartics, with resolution of his pain, but then subsequent  recurrence of his pain.  The patient had a CT scan without contrast in the  emergency room and was thought to have a possible perforation of his  bladder, but repeat CT with contrast showed no perforation but did appear  to  show some hydronephrosis on the left side.  GI consultation was obtained  with a diagnosis of no diverticulitis, and therefore, the patient is for  cystoscopy, retrograde pyelograms, and left double-J catheter, to see if  this resolves his abdominal pain.   DESCRIPTION OF PROCEDURE:  Cystogram is performed with a 16 red rubber  Robinson and shows that the bladder contours normal.  The mucosal wall is  smooth.  There is no evidence of leak of contrast on the AP film or on the  oblique films.  There is no reflux.   Cystoscopy was performed which shows that there are areas in the bladder  status post resection with white fibrinoid mucus present.  The left ureteral  orifice is identified next to an area of resection, and retrograde pyelogram  is performed which shows mild dilation of the ureter with some hang-up of  contrast at the ureterovesical junction, presumably secondary to edema.  A  guidewire is passed into the renal pelvis, and a 6 x 26 catheter is passed  easily into the renal pelvis, coiled in the renal pelvis, and coiled in the  bladder under fluoroscopic control.  Right retrograde pyelogram shows normal-  appearing ureter, again with some narrowing of the ureter at the level of  the ureterovesical junction but with good contrast flow to the bladder in  both cases.   The bladder was drained of fluid; the patient was awakened and taken to the  recovery room in good condition.                                               Sigmund I. Patsi Sears, M.D.    SIT/MEDQ  D:  05/15/2003  T:  05/15/2003  Job:  [id number]

## 2010-07-03 NOTE — Op Note (Signed)
NAME:  Mark [name], Mark [name]                         ACCOUNT NO.:  [id number]   MEDICAL RECORD NO.:  [id number]                   PATIENT TYPE:  AMB   LOCATION:  NESC                                 FACILITY:  [facility]   PHYSICIAN:  Sigmund I. Patsi Sears, M.D.         DATE OF BIRTH:  1950-09-02   DATE OF PROCEDURE:  09/02/2003  DATE OF DISCHARGE:                                 OPERATIVE REPORT   PREOPERATIVE DIAGNOSES:  1. Urinary frequency.  2. Incontinence.  3. History of bladder cancer.   POSTOPERATIVE DIAGNOSIS:  1. Urinary frequency.  2. Incontinence.  3. History of bladder cancer.   OPERATION PERFORMED:  1. Cystoscopy.  2. Urethral dilation.   SURGEON:  Sigmund I. Patsi Sears, M.D.   RESIDENT SURGEON:  Thyra Breed, MD   ANESTHESIA:  General endotracheal.   DRAINS:  None.   COMPLICATIONS:  None.   INDICATIONS FOR PROCEDURE:  Mark [name] is a 60 year old male with a history of  low-grade superficial bladder cancer resected approximately three months  ago.  At the time of his TUR with deep resection, he was given a single  perioperative dose of Mitomycin.  Postoperatively there was presumed  extravasation of the chemotherapy with the patient requiring  hospitalization.  He is now improved from that, however, suffering from  incontinence, poor stream as well as continued pelvic pain.  The patient was  brought to the operating room today to undergo cystoscopy under anesthesia  with possible TUR resection as well as urethral dilation.  The patient  understands the risks, benefits and alternatives of this procedure and is  willing to proceed.   DESCRIPTION OF PROCEDURE:  Following identification by his arm bracelet, the  patient was brought to the operating room and placed in the supine position.  Here he received intravenous antibiotics and underwent successful induction  of general endotracheal anesthesia.  He was then moved to the dorsal  lithotomy position.  His perineum and  genitalia were then prepped with  Betadine and draped in the usual sterile fashion.  We placed a 12 degree  cystoscopic lens through a 24 French sheath transurethrally to reveal a  normal-appearing anterior urethra.  The posterior urethra showed evidence of  significant bilobar hypertrophy of the prostatic urethra.  There was no  evidence of urethral stricture.  Upon entry into the bladder, both the right  and the left urethral orifice were easily visualized and seemed to be  effluxing clear urine.  Further cystoscopic examination of the bladder  revealed 2+ trabeculation with multiple small diverticulum present  consistent with element of outflow obstruction.  The right posterolateral  bladder wall showed evidence of a previous resection site well healed at  this time with no evidence of recurrent tumor.  The remainder of the  cystoscopic examination was normal with no other evidence of papillary  lesions, foreign bodies, mucosal irregularities or stones.  The cystoscope  was then removed.  We then dilated  the urethra to 30 [place] using [employer].  The bladder was drained in its entirety.  Xylocaine jelly was then  inserted into the urethra.  The patient tolerated the procedure well.  There  were no complications.  Please note that Dr. Patsi Sears was present and  participated in the entire procedures.  He was the responsible surgeon.   DISPOSITION:  After waking from general anesthesia, the patient was  transported to the post anesthesia care unit in stable condition.  From here  he would be discharged to home to follow up with Dr. Patsi Sears for further  evaluation and management.     Thyra Breed, MD                            Sigmund I. Patsi Sears, M.D.    EG/MEDQ  D:  09/02/2003  T:  09/02/2003  Job:  [id number]

## 2010-07-03 NOTE — Consult Note (Signed)
NAME:  Mark [name], Mark [name]                         ACCOUNT NO.:  [id number]   MEDICAL RECORD NO.:  [id number]                   PATIENT TYPE:  OUT   LOCATION:  XRAY                                 [facility]ACILITY:  [facility]   PHYSICIAN:  Sondra Come, D.O.                 DATE OF BIRTH:  [birth year]   DATE OF CONSULTATION:  10/26/2001  DATE OF DISCHARGE:  10/10/2001                                   CONSULTATION   REASON FOR CONSULTATION:  The patient returns to clinic today as scheduled  for re-evaluation.  He has undergone two lumbar epidural steroid injections  to decrease the presumed central component of his peripheral neuropathy  without any relief.  We discussed further treatment options, and at this  point, further epidural injections are not warranted.  We discussed  medications in detail, including anti-convulsants, opiate analgesics, and  topical treatments.  We also discussed galvonic neuromuscular stimulation.  His symptoms had not significantly changed.  He continues to have a burning  cold feeling in his feet bilaterally.  His pain is a 9/10 on a subjective  scale.  He continues on hydrocodone sparingly which takes the edge off.  He  has been on 2400 mg of Neurontin without any effect, and no effect with  Zonegran 100 mg.  He does have financial concerns as he pays out-of-pocket  for brand name prescription drugs.  His function and quality of life indices  remains somewhat declined, although he continues working.  I reviewed health  and history form and 14 point review of systems.   PHYSICAL EXAMINATION:  GENERAL:  A healthy male in no acute distress.  VITAL SIGNS:  Blood pressure is 135/82, pulse 87, respirations 18, O2  saturation is 98% on room air.  NEUROLOGIC:  Manual muscle testing is 5/5 bilateral lower extremities.  Sensory examination is intact to light touch bilateral lower extremities.  He does have some mild dysesthesia, however, with light touch of his feet.  Muscle  stretch reflexes are 2+/4 bilateral patellar, medial hamstrings, and  1+/4 bilateral Achilles today.   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   PLAN:  1. Again, I had a thorough discussion with the patient regarding further     treatment options.  At this point, we will give him a trial of Lidoderm     patches to apply to his feet up to 12 hours per day.  I gave him four     sample patches and a prescription for #30 without refills.  2. Consider adding Gabitril at bedtime if symptoms are not improving with     Lidoderm.  3. Consider galvonic neuromuscular stimulation.  4. Consider changing hydrocodone to methadone.  5. Consider changing lorazepam to Klonopin.  6. The patient is to return to clinic in two weeks for re-evaluation.   The patient was educated on the above findings and recommendations and  understands.  There were no barriers to communication.                                               Sondra Come, D.O.    JJW/MEDQ  D:  10/26/2001  T:  10/26/2001  Job:  [id number]   cc:   Valentino Hue. Magrinat, M.D.  [address] [facility]  [place]  [place] [address]  Fax: [phone]

## 2010-07-03 NOTE — H&P (Signed)
NAME:  Mark [name], Mark [name]                         ACCOUNT NO.:  [id number]   MEDICAL RECORD NO.:  [id number]                   PATIENT TYPE:  INP   LOCATION:  0349                                 FACILITY:  [facility]   PHYSICIAN:  Iva Boop, M.D. [facility]           DATE OF BIRTH:  13-Mar-1950   DATE OF ADMISSION:  05/31/2003  DATE OF DISCHARGE:                                HISTORY & PHYSICAL   CHIEF COMPLAINT:  Left-sided abdominal pain times one month.   HISTORY:  Mark [name] is a pleasant 60 year old white male known to Dr. Russella Dar  with a history of colon cancer which was diagnosed in 2001. He underwent  sigmoid colectomy with Dr. Johna Sheriff and was found to have one of eleven  lymph nodes positive. He was subsequently underwent chemotherapy with Dr.  Darnelle Catalan. He had follow-up colonoscopy in June 2003 with finding of nodule  versus polyp at the  anastomosis and then several small sigmoid colon  polyps, which were removed.  Biopsy from the anastomosis showed benign  lymphoid nodule.  Due for follow-up colonoscopy in June 2005. The patient  had recently been evaluated by Dr. Patsi Sears for new onset of gross  hematuria and had undergone cystourethral dilation and bilateral retrograde  pyelograms on May 06, 2003. He was found to have two small bladder tumors  both 1 cm or less. These were resected and his bladder was instilled with  mitomycin.  Pathology from these lesions showed a low-grade papillary nodule  noninvasive urothelial carcinoma. The patient presented back on May 10, 2003, with complaints of intense left lower quadrant pain. CT scan of the  abdomen and pelvis was obtained and showed edema outside the bladder and  mild left hydronephrosis. He was treated as an outpatient and then continued  to complain of severe pain and was admitted on May 13, 2003, with  persistent pain. Repeat CT scan at that time with IV and oral contrast  showed some left hydronephrosis and edema around the  bladder and surrounding  the descending colon and into the pericolic gutters. There was also a fluid  collection approximately 7 cm in size. The patient had been placed on IV  antibiotics with Cipro and Flagyl, and because of the left hydronephrosis,  decision was made to proceed with cysto and left double-J catheter which was  done May 15, 2003.  This seemed to help his pain and he was discharged to  home to complete a course of Cipro and Flagyl times ten days. He is to  follow up with Dr. Russella Dar in the office. In the interim he did not feel that  his pain significantly improved and he has been seen by Dr. Patsi Sears. His  left double-J catheter has since removed about 12 days ago, again without  any improvement in his symptoms. He has had ongoing constant pain and is  requiring Percocet on top of his Duragesic patch at home  for pain relief.  His wife says his appetite is poor and he is afraid to eat as this seems to  be exacerbate his pain. He has been eating very light and has lost 10 pounds  since he was discharged from the hospital. Prior to that they relate that he  has lost approximately 30 pounds since the beginning of the year. Repeat CT  scan was done per Dr. Patsi Sears on May 29, 2003, showing some mild  fullness of the left pelvocaliceal system and mucosal thickening of the  distal descending and proximal sigmoid colon at the anastomosis with some  stranding in the surrounding fat planes. This was felt to be inflammatory;  however, could not rule out recurrence of his colon cancer. He was noted to  have thick walled bladder. At this time he is admitted from home for pain  management coverage with IV antibiotics and further GI workup.  He relates  that he has had continued pain on a scale of 7-10 at home. He describes this  pain as a constant, gassy, pressure-like sensation with bloating. This does  not radiate into his back or into his leg.  He has had some intermittent low   grade temperature at home in the 100 range. He has not had any rigors or  sweats.   CURRENT MEDICATIONS:  1. Duragesic patch 75 mcg q.72h. for neuropathy.  2. Cymbalta 60 mg daily.  3. Ambien 10 mg q.h.s.  4. Stool softener p.r.n.  5. Percocet p.r.n.   His wife relates he has also taking some Toradol recently to help with his  pain.   ALLERGIES:  PENICILLIN which caused a paralysis reaction as a child.   PAST HISTORY:  1. As outlined above, he is status post cholecystectomy in 1998. Has history     of chemo-induced peripheral neuropathy.  2. IBS.  3. Remote head injury at age 84.  4. Basal cell CA of the face.   FAMILY HISTORY:  Mother deceased at 32 with complications of congestive  heart failure. Father deceased at 18 with COPD. One sister with history of  breast cancer.   SOCIAL HISTORY:  The patient is married. He has two grown children who live  in [place]. 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   REVIEW OF SYSTEMS:  CARDIOVASCULAR: Denies any chest pain or anginal  symptoms. PULMONARY: Negative for cough, shortness of breath, or sputum  production. GENITOURINARY:  He has had some persistent mild dysuria. No  hematuria. GI: As outlined above. Specifically, he has not had been having  any diarrhea and is having a bowel movement about every other day; however,  these are not normally and have generally been loose to watery stools. He  has not noted any melena or hematochezia. GI:  As otherwise described above.   PHYSICAL EXAMINATION:  GENERAL: A well-developed white male in no acute  distress. He is alert and oriented times three.  VITAL SIGNS: Temperature is 99.4, blood pressure 116/76, pulse 98.  HEENT: Normocephalic and atraumatic. EOMI. PERRLA. Sclerae anicteric.  NECK: Supple without nodes. CARDIOVASCULAR: Regular rate and rhythm with S1 and S2. No murmurs, rubs, or  gallops.  PULMONARY: Clear to A&P.  ABDOMEN: Soft.  Bowel sounds are active. He is tender in the left mid  quadrant and left lower quadrant. There is no guarding or rebound. No  palpable mass or hepatosplenomegaly.  RECTAL EXAM: Not done at this time.  EXTREMITIES: Without clubbing, cyanosis, or edema. Pulses are 2+ and equal.  NEUROLOGIC: Grossly nonfocal.   Laboratory studies are pending.   IMPRESSION:  53. A 60 year old white male with history of colon carcinoma, C1, status post     sigmoid colectomy and chemotherapy 2001.  2. Newly diagnosed bladder carcinoma with low-grade urothelial carcinoma,     May 06, 2003, status post resection of two small lesions and mitomycin     installation.  3. Persistent severe left lower quadrant pain with inflammatory process on     serial CT scans initially surrounding the bladder and associated with     focal fluid collection and left hydronephrosis, now with resolved fluid     collection, but persistent inflammatory changes more notably around the     sigmoid colectomy. Rule out chemical/mitomycin peritonitis/inflammatory     process secondary to micro perforation of the bladder.  4. Rule out colitis. Rule out recurrence of colon carcinoma.  5. History of chemotherapy-induced peripheral neuropathy.  6. Status post cholecystectomy.  7. History of colon polyps.   PLAN:  The patient is admitted to the service of Dr. Stan Head who is  covering the hospital. He will placed on his Duragesic patch 75 mg every  three days. Will add Demerol and Phenergan for additional pain control. Will  be kept on a full liquid diet. Will obtain laboratory studies and KUB, cover  with IV Cipro and Flagyl, and plan flexible sigmoidoscopy in the a.m. and  then further workup depending on findings after sigmoidoscopy.     Mike Gip, P.A.-C. [facility]                Iva Boop, M.D. [facility]    AE/MEDQ  D:  05/31/2003  T:  05/31/2003  Job:  [id number]   cc:   Vonzell Schlatter. Patsi Sears, M.D.  [address]   [place]  [place] [address]  Fax: [phone]

## 2010-07-03 NOTE — Assessment & Plan Note (Signed)
Mark [name] is back regarding his chronic pain syndrome.  The pain is stable, and  ranging from a 6-7/10.  He has gotten a clearance from his doctors in  [place] to increase his activity.  He has been working on the treadmill,  walking about 8 hours a week, and doing some light weightlifting.  He has  increased his ambulation, but, with the increased ambulation, he is having  more pain in his feet afterwards.  He maintains on fentanyl 50 mcg q.72h.,  as well as Cymbalta 50 mg twice a day, Ultram 50 mg p.r.n., Lyrica 50 mg  twice a day.  He was wondering if we can increase his fentanyl patch,  perhaps up to 75 mcg.  His sleep is fair with Restoril and Ativan.  The pain  is described as sharp, stabbing, tingling.  The pain interferes with general  activity, relationships with others, and enjoyment of life on a moderate to  severe level.   REVIEW OF SYSTEMS:  The patient reports trouble walking, some depression,  anxiety, confusion, nausea, constipation, skin rash and sun spots, and  occasional shortness of breath, as well.   SOCIAL HISTORY:  The patient is married, and he is on disability.   PHYSICAL EXAMINATION:  VITAL SIGNS:  Blood pressure is 113/72, pulse of 75,  respiratory rate 16.  He is satting 98% on room air.  GENERAL:  The patient is pleasant, in no acute distress.  He is alert and  oriented x3.  Affect is bright and appropriate.  Gaze is slightly wide-based  but intact, and seems to be less antalgic than it had been on his last  visit.  Coordination is fair.  Reflexes are 1+.  Sensation is decreased at  the peripheral extremities, particularly the lower extremities today.  Motor  function is 4+ to 5/5.  HEART:  Regular rate and rhythm.  LUNGS:  Clear.  ABDOMEN:  Soft and nontender.  NEUROLOGIC:  Cognitively, showed good insight and awareness.   ASSESSMENT:  1.  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  2.  History of bladder cancer status post bladder,  prostate, ureter and      appendix removal.  3.  Active urostomy.  4.  Reactive depression.   PLAN:  1.  Will give the patient a trial of topical cream including 2% baclofen and      Elavil and 20% ketoprofen.  He will apply this b.i.d.  2.  Consider increase of fentanyl up to 62.5 mg per hour, which would have      to consist of 2 separate patches of 50 mcg and 12.5 mcg respectively.  3.  The patient will maintain on Lyrica 50 mg b.i.d., Cymbalta 50 mg twice      daily and Ultram 50 mg daily p.r.n.  4.  Encouraged lower impact aerobic activity such as swimming and water      walking as tolerated.  5.  I will see Mark [name] back in 2 months as scheduled.  He will call me with      any problems.      Ranelle Oyster, M.D.  Electronically Signed     ZTS/MedQ  D:  07/27/2005 15:17:24  T:  07/27/2005 16:22:39  Job #:  [id number]

## 2010-08-03 ENCOUNTER — Other Ambulatory Visit: Payer: Self-pay | Admitting: Oncology

## 2010-08-03 ENCOUNTER — Encounter (HOSPITAL_BASED_OUTPATIENT_CLINIC_OR_DEPARTMENT_OTHER): Payer: Medicare Other | Admitting: Oncology

## 2010-08-03 DIAGNOSIS — Z85038 Personal history of other malignant neoplasm of large intestine: Secondary | ICD-10-CM

## 2010-08-03 DIAGNOSIS — Z8551 Personal history of malignant neoplasm of bladder: Secondary | ICD-10-CM

## 2010-08-03 LAB — CBC WITH DIFFERENTIAL/PLATELET
BASO%: 0.8 % (ref 0.0–2.0)
Basophils Absolute: 0 10*3/uL (ref 0.0–0.1)
EOS%: 6.9 % (ref 0.0–7.0)
Eosinophils Absolute: 0.3 10*3/uL (ref 0.0–0.5)
HCT: 38.1 % — ABNORMAL LOW (ref 38.4–49.9)
HGB: 13 g/dL (ref 13.0–17.1)
LYMPH%: 44.6 % (ref 14.0–49.0)
MCH: 29.6 pg (ref 27.2–33.4)
MCHC: 34 g/dL (ref 32.0–36.0)
MCV: 87 fL (ref 79.3–98.0)
MONO#: 0.4 10*3/uL (ref 0.1–0.9)
MONO%: 9.1 % (ref 0.0–14.0)
NEUT#: 1.9 10*3/uL (ref 1.5–6.5)
NEUT%: 38.6 % — ABNORMAL LOW (ref 39.0–75.0)
Platelets: 166 10*3/uL (ref 140–400)
RBC: 4.38 10*6/uL (ref 4.20–5.82)
RDW: 13.8 % (ref 11.0–14.6)
WBC: 4.9 10*3/uL (ref 4.0–10.3)
lymph#: 2.2 10*3/uL (ref 0.9–3.3)

## 2010-08-03 LAB — COMPREHENSIVE METABOLIC PANEL
ALT: 20 U/L (ref 0–53)
AST: 23 U/L (ref 0–37)
Albumin: 4.3 g/dL (ref 3.5–5.2)
Alkaline Phosphatase: 82 U/L (ref 39–117)
BUN: 19 mg/dL (ref 6–23)
CO2: 22 mEq/L (ref 19–32)
Calcium: 9.7 mg/dL (ref 8.4–10.5)
Chloride: 105 mEq/L (ref 96–112)
Creatinine, Ser: 1.54 mg/dL — ABNORMAL HIGH (ref 0.50–1.35)
Glucose, Bld: 151 mg/dL — ABNORMAL HIGH (ref 70–99)
Potassium: 3.7 mEq/L (ref 3.5–5.3)
Sodium: 139 mEq/L (ref 135–145)
Total Bilirubin: 0.4 mg/dL (ref 0.3–1.2)
Total Protein: 7.5 g/dL (ref 6.0–8.3)

## 2010-08-04 LAB — PSA, MEDICARE: PSA: 0.04 ng/mL (ref <=4.00)

## 2010-08-04 LAB — CEA: CEA: <0.5 ng/mL (ref 0.0–5.0)

## 2010-08-11 ENCOUNTER — Other Ambulatory Visit: Payer: Self-pay | Admitting: Dermatology

## 2010-09-02 ENCOUNTER — Ambulatory Visit
Admission: RE | Admit: 2010-09-02 | Discharge: 2010-09-02 | Disposition: A | Discharge status: Home / Self Care | Payer: Medicare Other | Source: Ambulatory Visit | Attending: Cardiology | Admitting: Cardiology

## 2010-09-02 ENCOUNTER — Other Ambulatory Visit: Payer: Self-pay | Admitting: Cardiology

## 2010-09-02 DIAGNOSIS — R071 Chest pain on breathing: Secondary | ICD-10-CM

## 2010-12-22 ENCOUNTER — Encounter (HOSPITAL_COMMUNITY): Payer: Self-pay

## 2010-12-23 ENCOUNTER — Encounter (HOSPITAL_COMMUNITY): Payer: Self-pay

## 2010-12-23 ENCOUNTER — Encounter (HOSPITAL_COMMUNITY)
Admission: RE | Admit: 2010-12-23 | Discharge: 2010-12-23 | Disposition: A | Discharge status: Home / Self Care | Payer: Medicare Other | Source: Ambulatory Visit | Attending: Neurosurgery | Admitting: Neurosurgery

## 2010-12-23 HISTORY — DX: Chronic kidney disease, unspecified: N18.9

## 2010-12-23 HISTORY — DX: Other specified postprocedural states: Z98.890

## 2010-12-23 HISTORY — DX: Gastro-esophageal reflux disease without esophagitis: K21.9

## 2010-12-23 HISTORY — DX: Hyperlipidemia, unspecified: E78.5

## 2010-12-23 HISTORY — DX: Other specified postprocedural states: R11.2

## 2010-12-23 LAB — CBC
HCT: 41.1 % (ref 39.0–52.0)
Hemoglobin: 14.2 g/dL (ref 13.0–17.0)
MCH: 29.8 pg (ref 26.0–34.0)
MCHC: 34.5 g/dL (ref 30.0–36.0)
MCV: 86.2 fL (ref 78.0–100.0)
Platelets: 197 10*3/uL (ref 150–400)
RBC: 4.77 MIL/uL (ref 4.22–5.81)
RDW: 13.7 % (ref 11.5–15.5)
WBC: 6.4 10*3/uL (ref 4.0–10.5)

## 2010-12-23 LAB — SURGICAL PCR SCREEN
MRSA, PCR: NEGATIVE
Staphylococcus aureus: NEGATIVE

## 2010-12-23 NOTE — Pre-Procedure Instructions (Signed)
20 Mark [name]  12/23/2010   Your procedure is scheduled on: Nov 13 Tuesday    Report to [facility] at 1030 AM.  Call this number if you have problems the morning of surgery: [phone]   Remember:   Do not eat food:After Midnight.  Do not drink clear liquids: 4 Hours before arrival.  Take these medicines the morning of surgery with A SIP OF WATER: stop aspirin, pain pill, prilosec   Do not wear jewelry, make-up or nail polish.  Do not wear lotions, powders, or perfumes. You may wear deodorant.  Do not shave 48 hours prior to surgery.  Do not bring valuables to the hospital.  Contacts, dentures or bridgework may not be worn into surgery.  Leave suitcase in the car. After surgery it may be brought to your room.  For patients admitted to the hospital, checkout time is 11:00 AM the day of discharge.   Patients discharged the day of surgery will not be allowed to drive home.  Name and phone number of your driver: Malena Catholic   Special Instructions: CHG Shower Use Special Wash: 1/2 bottle night before surgery and 1/2 bottle morning of surgery.   Please read over the following fact sheets that you were given: Pain Booklet and Surgical Site Infection Prevention

## 2010-12-28 MED ORDER — VANCOMYCIN HCL IN DEXTROSE 1-5 GM/200ML-% IV SOLN
1000.0000 mg | INTRAVENOUS | Status: DC
  Filled 2010-12-28: qty 200

## 2010-12-29 ENCOUNTER — Encounter (HOSPITAL_COMMUNITY): Payer: Self-pay | Admitting: Anesthesiology

## 2010-12-29 ENCOUNTER — Encounter (HOSPITAL_COMMUNITY): Payer: Self-pay | Admitting: *Deleted

## 2010-12-29 ENCOUNTER — Ambulatory Visit (HOSPITAL_COMMUNITY): Payer: Medicare Other | Admitting: Anesthesiology

## 2010-12-29 ENCOUNTER — Ambulatory Visit (HOSPITAL_COMMUNITY)
Admission: RE | Admit: 2010-12-29 | Discharge: 2010-12-30 | Disposition: A | Discharge status: Home / Self Care | Payer: Medicare Other | Source: Ambulatory Visit | Attending: Neurosurgery | Admitting: Neurosurgery

## 2010-12-29 ENCOUNTER — Encounter (HOSPITAL_COMMUNITY): Payer: Self-pay | Admitting: Neurosurgery

## 2010-12-29 ENCOUNTER — Encounter (HOSPITAL_COMMUNITY)
Admission: RE | Disposition: A | Discharge status: Home / Self Care | Payer: Self-pay | Source: Ambulatory Visit | Attending: Neurosurgery

## 2010-12-29 DIAGNOSIS — G609 Hereditary and idiopathic neuropathy, unspecified: Secondary | ICD-10-CM | POA: Insufficient documentation

## 2010-12-29 DIAGNOSIS — G8929 Other chronic pain: Secondary | ICD-10-CM | POA: Insufficient documentation

## 2010-12-29 DIAGNOSIS — Z01812 Encounter for preprocedural laboratory examination: Secondary | ICD-10-CM | POA: Insufficient documentation

## 2010-12-29 HISTORY — PX: SPINAL CORD STIMULATOR REMOVAL: SHX5379

## 2010-12-29 SURGERY — CERVICAL SPINAL CORD STIMULATOR REMOVAL
Anesthesia: General | Site: Back | Wound class: Clean

## 2010-12-29 MED ORDER — HEMOSTATIC AGENTS (NO CHARGE) OPTIME
TOPICAL | Status: DC | PRN
  Administered 2010-12-29: 1 via TOPICAL

## 2010-12-29 MED ORDER — LACTATED RINGERS IV SOLN
INTRAVENOUS | Status: DC | PRN
  Administered 2010-12-29: 18:00:00 via INTRAVENOUS

## 2010-12-29 MED ORDER — TRAMADOL HCL 50 MG PO TABS
50.0000 mg | ORAL_TABLET | Freq: Two times a day (BID) | ORAL | Status: DC
  Administered 2010-12-29 – 2010-12-30 (×2): 50 mg via ORAL
  Filled 2010-12-29 (×3): qty 1

## 2010-12-29 MED ORDER — BUPIVACAINE HCL (PF) 0.5 % IJ SOLN
INTRAMUSCULAR | Status: DC | PRN
  Administered 2010-12-29: 10 mL

## 2010-12-29 MED ORDER — PROPOFOL 10 MG/ML IV EMUL
INTRAVENOUS | Status: DC | PRN
  Administered 2010-12-29: 200 mg via INTRAVENOUS

## 2010-12-29 MED ORDER — KCL IN DEXTROSE-NACL 20-5-0.45 MEQ/L-%-% IV SOLN
INTRAVENOUS | Status: DC
  Filled 2010-12-29 (×2): qty 1000

## 2010-12-29 MED ORDER — VANCOMYCIN HCL IN DEXTROSE 1-5 GM/200ML-% IV SOLN
1000.0000 mg | Freq: Two times a day (BID) | INTRAVENOUS | Status: DC
  Administered 2010-12-30: 1000 mg via INTRAVENOUS
  Filled 2010-12-29 (×2): qty 200

## 2010-12-29 MED ORDER — VANCOMYCIN HCL IN DEXTROSE 1-5 GM/200ML-% IV SOLN
1000.0000 mg | Freq: Two times a day (BID) | INTRAVENOUS | Status: DC
  Filled 2010-12-29: qty 200

## 2010-12-29 MED ORDER — LORAZEPAM 0.5 MG PO TABS
1.0000 mg | ORAL_TABLET | Freq: Every day | ORAL | Status: DC
  Administered 2010-12-29: 1 mg via ORAL
  Filled 2010-12-29: qty 2

## 2010-12-29 MED ORDER — ALUM & MAG HYDROXIDE-SIMETH 400-400-40 MG/5ML PO SUSP
30.0000 mL | Freq: Four times a day (QID) | ORAL | Status: DC | PRN
  Filled 2010-12-29: qty 30

## 2010-12-29 MED ORDER — HYDROCODONE-ACETAMINOPHEN 5-325 MG PO TABS
1.0000 | ORAL_TABLET | ORAL | Status: DC | PRN
  Administered 2010-12-29 – 2010-12-30 (×2): 2 via ORAL
  Filled 2010-12-29 (×2): qty 2

## 2010-12-29 MED ORDER — PHENYLEPHRINE HCL 10 MG/ML IJ SOLN
INTRAMUSCULAR | Status: DC | PRN
  Administered 2010-12-29 (×3): 80 ug via INTRAVENOUS

## 2010-12-29 MED ORDER — ONDANSETRON HCL 4 MG/2ML IJ SOLN: 4.0000 mg | Freq: Once | INTRAMUSCULAR | Status: DC | PRN

## 2010-12-29 MED ORDER — SCOPOLAMINE 1 MG/3DAYS TD PT72
MEDICATED_PATCH | TRANSDERMAL | Status: DC | PRN
  Administered 2010-12-29: 1 via TRANSDERMAL

## 2010-12-29 MED ORDER — MENTHOL 3 MG MT LOZG: 1.0000 | LOZENGE | OROMUCOSAL | Status: DC | PRN

## 2010-12-29 MED ORDER — FENOFIBRATE 160 MG PO TABS
160.0000 mg | ORAL_TABLET | Freq: Every day | ORAL | Status: DC
  Administered 2010-12-29: 160 mg via ORAL
  Filled 2010-12-29 (×2): qty 1

## 2010-12-29 MED ORDER — SENNA-DOCUSATE SODIUM 8.6-50 MG PO TABS: 1.0000 | ORAL_TABLET | Freq: Every day | ORAL | Status: DC

## 2010-12-29 MED ORDER — DULOXETINE HCL 60 MG PO CPEP
120.0000 mg | ORAL_CAPSULE | ORAL | Status: DC
  Administered 2010-12-30: 120 mg via ORAL
  Filled 2010-12-29 (×2): qty 2

## 2010-12-29 MED ORDER — SENNOSIDES-DOCUSATE SODIUM 8.6-50 MG PO TABS
1.0000 | ORAL_TABLET | Freq: Every day | ORAL | Status: DC
  Administered 2010-12-29: 1 via ORAL
  Filled 2010-12-29 (×2): qty 1

## 2010-12-29 MED ORDER — SODIUM CHLORIDE 0.9 % IJ SOLN: 3.0000 mL | Freq: Two times a day (BID) | INTRAMUSCULAR | Status: DC

## 2010-12-29 MED ORDER — THERA M PLUS PO TABS
1.0000 | ORAL_TABLET | Freq: Every day | ORAL | Status: DC
  Administered 2010-12-30: 1 via ORAL
  Filled 2010-12-29 (×2): qty 1

## 2010-12-29 MED ORDER — LIDOCAINE-EPINEPHRINE 1 %-1:100000 IJ SOLN
INTRAMUSCULAR | Status: DC | PRN
  Administered 2010-12-29: 10 mL

## 2010-12-29 MED ORDER — ACETAMINOPHEN 325 MG PO TABS: 650.0000 mg | ORAL_TABLET | ORAL | Status: DC | PRN

## 2010-12-29 MED ORDER — OXYCODONE-ACETAMINOPHEN 5-325 MG PO TABS: 1.0000 | ORAL_TABLET | ORAL | Status: DC | PRN

## 2010-12-29 MED ORDER — DOCUSATE SODIUM 100 MG PO CAPS
200.0000 mg | ORAL_CAPSULE | ORAL | Status: DC
  Administered 2010-12-30: 200 mg via ORAL
  Filled 2010-12-29: qty 1

## 2010-12-29 MED ORDER — PREGABALIN 50 MG PO CAPS
50.0000 mg | ORAL_CAPSULE | ORAL | Status: DC
  Administered 2010-12-30: 50 mg via ORAL
  Filled 2010-12-29: qty 1

## 2010-12-29 MED ORDER — ACETAMINOPHEN 650 MG RE SUPP: 650.0000 mg | RECTAL | Status: DC | PRN

## 2010-12-29 MED ORDER — DOCUSATE SODIUM 100 MG PO CAPS
100.0000 mg | ORAL_CAPSULE | Freq: Two times a day (BID) | ORAL | Status: DC
  Administered 2010-12-29: 100 mg via ORAL
  Filled 2010-12-29: qty 1

## 2010-12-29 MED ORDER — FENTANYL 50 MCG/HR TD PT72: 50.0000 ug | MEDICATED_PATCH | TRANSDERMAL | Status: DC

## 2010-12-29 MED ORDER — TEMAZEPAM 30 MG PO CAPS: 30.0000 mg | ORAL_CAPSULE | Freq: Every evening | ORAL | Status: DC | PRN

## 2010-12-29 MED ORDER — POLYETHYLENE GLYCOL 3350 17 G PO PACK
17.0000 g | PACK | Freq: Three times a day (TID) | ORAL | Status: DC | PRN
  Filled 2010-12-29: qty 1

## 2010-12-29 MED ORDER — FENTANYL CITRATE 0.05 MG/ML IJ SOLN
INTRAMUSCULAR | Status: DC | PRN
  Administered 2010-12-29 (×2): 100 ug via INTRAVENOUS
  Administered 2010-12-29: 150 ug via INTRAVENOUS
  Administered 2010-12-29: 100 ug via INTRAVENOUS
  Administered 2010-12-29: 50 ug via INTRAVENOUS

## 2010-12-29 MED ORDER — THROMBIN 5000 UNITS EX KIT
PACK | CUTANEOUS | Status: DC | PRN
  Administered 2010-12-29 (×2): 5000 [IU] via TOPICAL

## 2010-12-29 MED ORDER — ONDANSETRON HCL 4 MG/2ML IJ SOLN
INTRAMUSCULAR | Status: DC | PRN
  Administered 2010-12-29: 4 mg via INTRAVENOUS

## 2010-12-29 MED ORDER — HYDROMORPHONE HCL PF 1 MG/ML IJ SOLN: 0.2500 mg | INTRAMUSCULAR | Status: DC | PRN

## 2010-12-29 MED ORDER — SODIUM CHLORIDE 0.9 % IR SOLN
Status: DC | PRN
  Administered 2010-12-29: 1000 mL

## 2010-12-29 MED ORDER — ONDANSETRON HCL 4 MG/2ML IJ SOLN: 4.0000 mg | INTRAMUSCULAR | Status: DC | PRN

## 2010-12-29 MED ORDER — VANCOMYCIN HCL 1000 MG IV SOLR
1000.0000 mg | INTRAVENOUS | Status: DC | PRN
  Administered 2010-12-29: 1 g via INTRAVENOUS

## 2010-12-29 MED ORDER — SODIUM CHLORIDE 0.9 % IJ SOLN: 3.0000 mL | INTRAMUSCULAR | Status: DC | PRN

## 2010-12-29 MED ORDER — MORPHINE SULFATE 4 MG/ML IJ SOLN: 1.0000 mg | INTRAMUSCULAR | Status: DC | PRN

## 2010-12-29 MED ORDER — DIAZEPAM 5 MG PO TABS
5.0000 mg | ORAL_TABLET | Freq: Four times a day (QID) | ORAL | Status: DC | PRN
  Administered 2010-12-29 – 2010-12-30 (×2): 5 mg via ORAL
  Filled 2010-12-29 (×2): qty 1

## 2010-12-29 MED ORDER — MIDAZOLAM HCL 5 MG/5ML IJ SOLN
INTRAMUSCULAR | Status: DC | PRN
  Administered 2010-12-29: 2 mg via INTRAVENOUS

## 2010-12-29 MED ORDER — SODIUM CHLORIDE 0.9 % IV SOLN: 250.0000 mL | INTRAVENOUS | Status: DC

## 2010-12-29 MED ORDER — GLYCOPYRROLATE 0.2 MG/ML IJ SOLN
INTRAMUSCULAR | Status: DC | PRN
  Administered 2010-12-29: .4 mg via INTRAVENOUS

## 2010-12-29 MED ORDER — PANTOPRAZOLE SODIUM 40 MG PO TBEC
40.0000 mg | DELAYED_RELEASE_TABLET | Freq: Every day | ORAL | Status: DC
  Administered 2010-12-29: 40 mg via ORAL
  Filled 2010-12-29: qty 1

## 2010-12-29 MED ORDER — PHENOL 1.4 % MT LIQD: 1.0000 | OROMUCOSAL | Status: DC | PRN

## 2010-12-29 MED ORDER — NEOSTIGMINE METHYLSULFATE 1 MG/ML IJ SOLN
INTRAMUSCULAR | Status: DC | PRN
  Administered 2010-12-29: 3 mg via INTRAVENOUS

## 2010-12-29 MED ORDER — ROCURONIUM BROMIDE 100 MG/10ML IV SOLN
INTRAVENOUS | Status: DC | PRN
  Administered 2010-12-29: 40 mg via INTRAVENOUS

## 2010-12-29 SURGICAL SUPPLY — 58 items
BAG DECANTER FOR FLEXI CONT (MISCELLANEOUS) ×2 IMPLANT
BENZOIN TINCTURE PRP APPL 2/3 (GAUZE/BANDAGES/DRESSINGS) ×4 IMPLANT
BIT DRILL NEURO 2X3.1 SFT TUCH (MISCELLANEOUS) ×1 IMPLANT
BLADE SURG ROTATE 9660 (MISCELLANEOUS) IMPLANT
BOOT ACCESSORY DBS 3550-25 (MISCELLANEOUS) ×2 IMPLANT
BRUSH SCRUB EZ PLAIN DRY (MISCELLANEOUS) ×2 IMPLANT
CLOSURE STERI STRIP 1/2 X4 (GAUZE/BANDAGES/DRESSINGS) ×6 IMPLANT
CLOTH BEACON ORANGE TIMEOUT ST (SAFETY) ×2 IMPLANT
CONT SPEC 4OZ CLIKSEAL STRL BL (MISCELLANEOUS) ×2 IMPLANT
DERMABOND ADVANCED (GAUZE/BANDAGES/DRESSINGS)
DERMABOND ADVANCED .7 DNX12 (GAUZE/BANDAGES/DRESSINGS) IMPLANT
DRAPE CAMERA VIDEO/LASER (DRAPES) IMPLANT
DRAPE INCISE IOBAN 85X60 (DRAPES) IMPLANT
DRAPE LAPAROTOMY 100X72X124 (DRAPES) ×2 IMPLANT
DRAPE POUCH INSTRU U-SHP 10X18 (DRAPES) ×2 IMPLANT
DRAPE SURG 17X23 STRL (DRAPES) ×2 IMPLANT
DRESSING TELFA 8X3 (GAUZE/BANDAGES/DRESSINGS) ×2 IMPLANT
DRILL NEURO 2X3.1 SOFT TOUCH (MISCELLANEOUS) ×2
ELECT REM PT RETURN 9FT ADLT (ELECTROSURGICAL) ×2
ELECTRODE REM PT RTRN 9FT ADLT (ELECTROSURGICAL) ×1 IMPLANT
GAUZE SPONGE 4X4 16PLY XRAY LF (GAUZE/BANDAGES/DRESSINGS) IMPLANT
GLOVE BIO SURGEON STRL SZ8 (GLOVE) ×2 IMPLANT
GLOVE BIOGEL PI IND STRL 7.5 (GLOVE) ×2 IMPLANT
GLOVE BIOGEL PI IND STRL 8 (GLOVE) IMPLANT
GLOVE BIOGEL PI IND STRL 8.5 (GLOVE) ×1 IMPLANT
GLOVE BIOGEL PI INDICATOR 7.5 (GLOVE) ×2
GLOVE BIOGEL PI INDICATOR 8 (GLOVE)
GLOVE BIOGEL PI INDICATOR 8.5 (GLOVE) ×1
GLOVE ECLIPSE 7.5 STRL STRAW (GLOVE) ×4 IMPLANT
GLOVE EXAM NITRILE LRG STRL (GLOVE) IMPLANT
GLOVE EXAM NITRILE MD LF STRL (GLOVE) IMPLANT
GLOVE EXAM NITRILE XL STR (GLOVE) IMPLANT
GLOVE EXAM NITRILE XS STR PU (GLOVE) IMPLANT
GOWN BRE IMP SLV AUR LG STRL (GOWN DISPOSABLE) IMPLANT
GOWN BRE IMP SLV AUR XL STRL (GOWN DISPOSABLE) ×4 IMPLANT
GOWN STRL REIN 2XL LVL4 (GOWN DISPOSABLE) IMPLANT
KIT BASIN OR (CUSTOM PROCEDURE TRAY) ×2 IMPLANT
KIT ROOM TURNOVER OR (KITS) ×2 IMPLANT
NEEDLE HYPO 25X1 1.5 SAFETY (NEEDLE) ×2 IMPLANT
NS IRRIG 1000ML POUR BTL (IV SOLUTION) ×2 IMPLANT
PACK LAMINECTOMY NEURO (CUSTOM PROCEDURE TRAY) ×2 IMPLANT
PAD ARMBOARD 7.5X6 YLW CONV (MISCELLANEOUS) ×6 IMPLANT
SPONGE GAUZE 4X4 12PLY (GAUZE/BANDAGES/DRESSINGS) ×4 IMPLANT
SPONGE LAP 4X18 X RAY DECT (DISPOSABLE) IMPLANT
SPONGE SURGIFOAM ABS GEL SZ50 (HEMOSTASIS) ×2 IMPLANT
STAPLER SKIN PROX WIDE 3.9 (STAPLE) ×2 IMPLANT
STRIP CLOSURE SKIN 1/2X4 (GAUZE/BANDAGES/DRESSINGS) IMPLANT
SUT SILK 0 TIES 10X30 (SUTURE) IMPLANT
SUT SILK 2 0 FS (SUTURE) ×2 IMPLANT
SUT SILK 2 0 TIES 10X30 (SUTURE) IMPLANT
SUT VIC AB 0 CT1 18XCR BRD8 (SUTURE) ×1 IMPLANT
SUT VIC AB 0 CT1 8-18 (SUTURE) ×1
SUT VIC AB 2-0 CP2 18 (SUTURE) ×2 IMPLANT
SUT VIC AB 3-0 SH 8-18 (SUTURE) ×2 IMPLANT
TAPE CLOTH SURG 4X10 WHT LF (GAUZE/BANDAGES/DRESSINGS) ×4 IMPLANT
TOWEL OR 17X24 6PK STRL BLUE (TOWEL DISPOSABLE) ×4 IMPLANT
TOWEL OR 17X26 10 PK STRL BLUE (TOWEL DISPOSABLE) ×2 IMPLANT
WATER STERILE IRR 1000ML POUR (IV SOLUTION) ×2 IMPLANT

## 2010-12-29 NOTE — Anesthesia Preprocedure Evaluation (Addendum)
Anesthesia Evaluation  Patient identified by MRN, date of birth, ID band Patient awake    Reviewed: Allergy & Precautions, H&P , NPO status , Patient's Chart, lab work & pertinent test results  History of Anesthesia Complications (+) PONV and Family history of anesthesia reaction  Airway Mallampati: II TM Distance: >3 FB and <3 FB Neck ROM: full    Dental  (+) Teeth Intact   Pulmonary neg pulmonary ROS,    Pulmonary exam normal       Cardiovascular Exercise Tolerance: Good neg cardio ROS regular Normal    Neuro/Psych Negative Neurological ROS  Negative Psych ROS   GI/Hepatic Neg liver ROS, GERD-  ,  Endo/Other  Negative Endocrine ROS  Renal/GU negative Renal ROS  Genitourinary negative   Musculoskeletal negative musculoskeletal ROS (+)   Abdominal   Peds  Hematology negative hematology ROS (+)   Anesthesia Other Findings   Reproductive/Obstetrics negative OB ROS                          Anesthesia Physical Anesthesia Plan  ASA: II  Anesthesia Plan: General   Post-op Pain Management:    Induction: Intravenous  Airway Management Planned: Oral ETT  Additional Equipment:   Intra-op Plan:   Post-operative Plan: Extubation in OR  Informed Consent: I have reviewed the patients History and Physical, chart, labs and discussed the procedure including the risks, benefits and alternatives for the proposed anesthesia with the patient or authorized representative who has indicated his/her understanding and acceptance.   Dental advisory given  Plan Discussed with: Anesthesiologist, CRNA and Surgeon  Anesthesia Plan Comments:        Anesthesia Quick Evaluation

## 2010-12-29 NOTE — Anesthesia Procedure Notes (Addendum)
Date/Time: 12/29/2010 6:15 PM Performed by: Shirlyn Goltz, TOM

## 2010-12-29 NOTE — Op Note (Signed)
12/29/2010  7:20 PM  PATIENT:  Mark [name]  60 y.o. male  PRE-OPERATIVE DIAGNOSIS:  PERIHERAL NEUROPATHY CHRONIC PAIN  POST-OPERATIVE DIAGNOSIS:  PERIHERAL NEUROPATHY CHRONIC PAIN  PROCEDURE:  Procedure(s): CERVICAL SPINAL CORD STIMULATOR REMOVAL  SURGEON:  Surgeon(s): Dorian Heckle, MD  PHYSICIAN ASSISTANT:   ASSISTANTS: none   ANESTHESIA:   local and generalmin  EBL:     BLOOD ADMINISTERED:none  DRAINS: none   LOCAL MEDICATIONS USED:  LIDOCAINE 10CC  SPECIMEN:  No Specimen  DISPOSITION OF SPECIMEN:  N/A  COUNTS:  YES  TOURNIQUET:  * No tourniquets in log *  DICTATION: . Patient was brought to the OR and after undergoing general endotracheal anesthesia he was turned onto the prone position on chest rolls. His previous incisions were prepped and draped in the usual sterile fashion and infiltrated with local lidocaine. The previous incisions were reopened initially in the midthoracic spine and the plantar electrode electro-paddles were identified and removed this required a laminotomy and microdissection to expose the paddle electrode. This was subsequently then removed without difficulty. The extension was cut. The IPG site was opened and the implanted IPG was removed along with remaining electrode extension. The wounds were then irrigated and closed with interrupted Vicryl stitches. Sterile occlusive dressing was placed. Patient was extubated in the operating room and taken to recovery in stable and satisfactory condition.  PLAN OF CARE: Admit for overnight observation  PATIENT DISPOSITION:  PACU - hemodynamically stable.   Delay start of Pharmacological VTE agent (>24hrs) due to surgical blood loss or risk of bleeding:  YES

## 2010-12-29 NOTE — H&P (Signed)
  Interval update.  Patient seen and re-examined.  No change in H & P (done within 30 days).

## 2010-12-29 NOTE — Transfer of Care (Signed)
Immediate Anesthesia Transfer of Care Note  Patient: Mark [name]  Procedure(s) Performed:  CERVICAL SPINAL CORD STIMULATOR REMOVAL - REMOVAL OF SPINAL CORD STIMULATOR, ELECTRODE AND IPG  Patient Location: PACU  Anesthesia Type: General  Level of Consciousness: awake, alert , oriented and patient cooperative  Airway & Oxygen Therapy: Patient Spontanous Breathing and Patient connected to nasal cannula oxygen  Post-op Assessment: Report given to PACU RN, Post -op Vital signs reviewed and stable and Patient moving all extremities X 4  Post vital signs: Reviewed and stable  Complications: No apparent anesthesia complications

## 2010-12-29 NOTE — Anesthesia Postprocedure Evaluation (Signed)
Anesthesia Post Note  Patient: Mark [name]  Procedure(s) Performed:  CERVICAL SPINAL CORD STIMULATOR REMOVAL - REMOVAL OF SPINAL CORD STIMULATOR, ELECTRODE AND IPG  Anesthesia type: General  Patient location: PACU  Post pain: Pain level controlled and Adequate analgesia  Post assessment: Post-op Vital signs reviewed, Patient's Cardiovascular Status Stable, Respiratory Function Stable, Patent Airway and Pain level controlled  Last Vitals:  Filed Vitals:   12/29/10 1930  BP:   Pulse:   Temp: 37.1 C  Resp:     Post vital signs: Reviewed and stable  Level of consciousness: awake, alert  and oriented  Complications: No apparent anesthesia complications

## 2010-12-30 ENCOUNTER — Encounter (HOSPITAL_COMMUNITY): Payer: Self-pay | Admitting: *Deleted

## 2010-12-30 DIAGNOSIS — Z23 Encounter for immunization: Secondary | ICD-10-CM

## 2010-12-30 MED ORDER — INFLUENZA VIRUS VACC SPLIT PF IM SUSP
0.5000 mL | INTRAMUSCULAR | Status: AC
  Administered 2010-12-30: 0.5 mL via INTRAMUSCULAR
  Filled 2010-12-30: qty 0.5

## 2010-12-30 NOTE — Discharge Summary (Signed)
Physician Discharge Summary  Patient ID: TYLEEK SMICK MRN: [id number] DOB/AGE: 03/07/1950 60 y.o.  Admit date: 12/29/2010 Discharge date: 12/30/2010  Admission Diagnoses:  Discharge Diagnoses:  Active Problems:  * No active hospital problems. *    Discharged Condition: good  Hospital Course: Removal of SCS with IPG. Overnight observation.  Consults: none  Significant Diagnostic Studies: None  Treatments: analgesia: Morphine and surgery: Removal of SCS  Discharge Exam: Blood pressure 91/59, pulse 76, temperature 97.9 F (36.6 C), temperature source Oral, resp. rate 18, weight 80.9 kg (178 lb 5.6 oz), SpO2 93.00%. General appearance: alert, cooperative and no distress Incision/Wound:C/D/I  Disposition:    Current Discharge Medication List    CONTINUE these medications which have NOT CHANGED   Details  aspirin EC 81 MG tablet Take 81 mg by mouth daily.      docusate sodium (COLACE) 100 MG capsule Take 200 mg by mouth every morning.      DULoxetine (CYMBALTA) 60 MG capsule Take 120 mg by mouth every morning.      fenofibrate 160 MG tablet Take 160 mg by mouth at bedtime.      fentaNYL (DURAGESIC - DOSED MCG/HR) 50 MCG/HR Place 1 patch onto the skin every other day.      fish oil-omega-3 fatty acids 1000 MG capsule Take 2 g by mouth 2 (two) times daily.      HYDROcodone-acetaminophen (LORTAB) 10-500 MG per tablet Take 1 tablet by mouth every 6 (six) hours as needed. For pain.     LORazepam (ATIVAN) 1 MG tablet Take 1 mg by mouth at bedtime.      Multiple Vitamins-Minerals (MULTIVITAMINS THER. W/MINERALS) TABS Take 1 tablet by mouth daily.      omeprazole (PRILOSEC) 20 MG capsule Take 20 mg by mouth 2 (two) times daily.      polyethylene glycol (MIRALAX / GLYCOLAX) packet Take 17 g by mouth 3 (three) times daily as needed. For constipation.     pregabalin (LYRICA) 50 MG capsule Take 50 mg by mouth every morning.      sennosides-docusate sodium (SENOKOT-S)  8.6-50 MG tablet Take 1 tablet by mouth at bedtime.      temazepam (RESTORIL) 30 MG capsule Take 30 mg by mouth at bedtime as needed. For sleep.     traMADol (ULTRAM) 50 MG tablet Take 50 mg by mouth 2 (two) times daily. Maximum dose= 8 tablets per day          Signed: Marvalene Barrett D 12/30/2010, 7:50 AM

## 2011-01-01 ENCOUNTER — Encounter (HOSPITAL_COMMUNITY): Payer: Self-pay | Admitting: Neurosurgery

## 2011-08-13 ENCOUNTER — Encounter: Payer: Self-pay | Admitting: Gastroenterology

## 2011-09-21 ENCOUNTER — Encounter: Payer: Self-pay | Admitting: Gastroenterology

## 2011-09-21 ENCOUNTER — Ambulatory Visit (INDEPENDENT_AMBULATORY_CARE_PROVIDER_SITE_OTHER): Payer: Medicare Other | Admitting: Gastroenterology

## 2011-09-21 VITALS — BP 120/70 | HR 80 | Ht 67.0 in | Wt 172.4 lb

## 2011-09-21 DIAGNOSIS — K5900 Constipation, unspecified: Secondary | ICD-10-CM

## 2011-09-21 DIAGNOSIS — R1319 Other dysphagia: Secondary | ICD-10-CM

## 2011-09-21 DIAGNOSIS — Z85038 Personal history of other malignant neoplasm of large intestine: Secondary | ICD-10-CM

## 2011-09-21 NOTE — Progress Notes (Signed)
History of Present Illness: This is a 61 year old male with dysphagia. He related symptoms with solid foods and primarily feels the symptoms in the left side of his neck. He had upper endoscopies in 2005 and 2009 for dysphagia and no strictures were noted however empiric dilations were performed. He's been treated long-term for GERD. Recently started Linzess for constipation with good results. Denies weight loss, abdominal pain, diarrhea, change in stool caliber, melena, hematochezia, nausea, vomiting, reflux symptoms, chest pain.  Current Medications, Allergies, Past Medical History, Past Surgical History, Family History and Social History were reviewed in [employer] record.  Physical Exam: General: Well developed , well nourished, no acute distress Head: Normocephalic and atraumatic Eyes:  sclerae anicteric, EOMI Ears: Normal auditory acuity Mouth: No deformity or lesions Lungs: Clear throughout to auscultation Heart: Regular rate and rhythm; no murmurs, rubs or bruits Abdomen: Soft, non tender and non distended. No masses, hepatosplenomegaly or hernias noted. Normal Bowel sounds Musculoskeletal: Symmetrical with no gross deformities  Pulses:  Normal pulses noted Extremities: No clubbing, cyanosis, edema or deformities noted Neurological: Alert oriented x 4, grossly nonfocal Psychological:  Alert and cooperative. Normal mood and affect  Assessment and Recommendations:  1. Personal history of colon cancer: T2, N1 in 2001. Surveillance colonoscopy recommended November 2014.  2. Dysphagia. History of dysphagia without a stricture. Symptoms might be GERD related. Schedule BA esophagram and upper endoscopy. The risks, benefits, and alternatives to endoscopy with possible biopsy and possible dilation were discussed with the patient and they consent to proceed.   3. Constipation. Improved on Linzess qod.

## 2011-09-21 NOTE — Patient Instructions (Addendum)
You have been scheduled for an endoscopy with propofol. Please follow written instructions given to you at your visit today. If you use inhalers (even only as needed), please bring them with you on the day of your procedure.  You have been scheduled for a Barium Esophogram at [facility]iology (1st floor of the hospital) on 09/23/11 at 9:30am. Please arrive 15 minutes prior to your appointment for registration. Make certain not to have anything to eat or drink 6 hours prior to your test. If you need to reschedule for any reason, please contact radiology at [phone] to do so.  cc: Lupe Carney, MD

## 2011-09-23 ENCOUNTER — Other Ambulatory Visit: Payer: Self-pay

## 2011-09-23 ENCOUNTER — Ambulatory Visit (HOSPITAL_COMMUNITY)
Admission: RE | Admit: 2011-09-23 | Discharge: 2011-09-23 | Disposition: A | Discharge status: Home / Self Care | Payer: Medicare Other | Source: Ambulatory Visit | Attending: Gastroenterology | Admitting: Gastroenterology

## 2011-09-23 DIAGNOSIS — R1319 Other dysphagia: Secondary | ICD-10-CM

## 2011-09-23 DIAGNOSIS — R079 Chest pain, unspecified: Secondary | ICD-10-CM | POA: Insufficient documentation

## 2011-09-23 DIAGNOSIS — R1310 Dysphagia, unspecified: Secondary | ICD-10-CM | POA: Insufficient documentation

## 2011-09-23 DIAGNOSIS — K224 Dyskinesia of esophagus: Secondary | ICD-10-CM | POA: Insufficient documentation

## 2011-09-23 MED ORDER — OMEPRAZOLE 20 MG PO CPDR: 40.0000 mg | DELAYED_RELEASE_CAPSULE | Freq: Two times a day (BID) | ORAL | Status: DC

## 2011-10-25 ENCOUNTER — Encounter: Payer: Medicare Other | Admitting: Gastroenterology

## 2011-11-02 ENCOUNTER — Encounter: Payer: Self-pay | Admitting: Gastroenterology

## 2011-11-02 ENCOUNTER — Ambulatory Visit (AMBULATORY_SURGERY_CENTER): Payer: Medicare Other | Admitting: Gastroenterology

## 2011-11-02 VITALS — BP 134/83 | HR 87 | Temp 98.0°F | Resp 15 | Ht 67.0 in | Wt 172.0 lb

## 2011-11-02 DIAGNOSIS — K219 Gastro-esophageal reflux disease without esophagitis: Secondary | ICD-10-CM

## 2011-11-02 DIAGNOSIS — K2990 Gastroduodenitis, unspecified, without bleeding: Secondary | ICD-10-CM

## 2011-11-02 DIAGNOSIS — R1319 Other dysphagia: Secondary | ICD-10-CM

## 2011-11-02 DIAGNOSIS — K2970 Gastritis, unspecified, without bleeding: Secondary | ICD-10-CM

## 2011-11-02 MED ORDER — SODIUM CHLORIDE 0.9 % IV SOLN: 500.0000 mL | INTRAVENOUS | Status: DC

## 2011-11-02 NOTE — Progress Notes (Signed)
Patient did not experience any of the following events: a burn prior to discharge; a fall within the facility; wrong site/side/patient/procedure/implant event; or a hospital transfer or hospital admission upon discharge from the facility. (G8907) Patient did not have preoperative order for IV antibiotic SSI prophylaxis. (G8918)  

## 2011-11-02 NOTE — Progress Notes (Signed)
The pt tolerated the egd with dilatation very well. Maw   

## 2011-11-02 NOTE — Op Note (Signed)
[facility] [address] [place], [address]   ENDOSCOPY PROCEDURE REPORT  PATIENT: Mark [name], Mark [name]  MR#: [id number] BIRTHDATE: [birth year] , 61  yrs. old GENDER: Male ENDOSCOPIST: Meryl Dare, MD, [facility]  PROCEDURE DATE:  11/02/2011 PROCEDURE:  EGD w/ biopsy and Savary dilation of esophagus ASA CLASS:     Class II INDICATIONS:  history of esophageal reflux.   dysphagia. MEDICATIONS: MAC sedation, administered by CRNA and propofol (Diprivan) 250mg  IV TOPICAL ANESTHETIC: Cetacaine Spray DESCRIPTION OF PROCEDURE: After the risks benefits and alternatives of the procedure were thoroughly explained, informed consent was obtained.  The LB GIF-H180 [id number] endoscope was introduced through the mouth and advanced to the second portion of the duodenum. Without limitations.  The instrument was slowly withdrawn as the mucosa was fully examined.   STOMACH: Mild gastritis (inflammation) was found in the gastric antrum and gastric body. It was erythematous and granular. Multiple biopsies were performed.   The stomach otherwise appeared normal. DUODENUM: The duodenal mucosa showed no abnormalities in the bulb and second portion of the duodenum. ESOPHAGUS: The mucosa of the esophagus appeared normal.  Retroflexed views revealed a small hiatal hernia.  After guidewire insertion a 17 mm  Savary dilator was passed for dysphagia without a stricture. with no heme and no resistance noted.   The scope was then withdrawn from the patient and the procedure completed.  COMPLICATIONS: There were no complications.  ENDOSCOPIC IMPRESSION: 1.   Gastritis (inflammation); multiple biopsies 2.   Small hiatal hernia  RECOMMENDATIONS: 1.  anti-reflux regimen 2.  continue PPI 3.  post dilation instructions    eSigned:  Meryl Dare, MD, [facility] 11/02/2011 1:43 PM   XB:JYNW Clovis Riley, MD

## 2011-11-02 NOTE — Patient Instructions (Addendum)
YOU HAD AN ENDOSCOPIC PROCEDURE TODAY AT THE [facility]: Refer to the procedure report that was given to you for any specific questions about what was found during the examination.  If the procedure report does not answer your questions, please call your gastroenterologist to clarify.  If you requested that your care partner not be given the details of your procedure findings, then the procedure report has been included in a sealed envelope for you to review at your convenience later.  YOU SHOULD EXPECT: Some feelings of bloating in the abdomen. Passage of more gas than usual.  Walking can help get rid of the air that was put into your GI tract during the procedure and reduce the bloating. If you had a lower endoscopy (such as a colonoscopy or flexible sigmoidoscopy) you may notice spotting of blood in your stool or on the toilet paper. If you underwent a bowel prep for your procedure, then you may not have a normal bowel movement for a few days.  DIET: Please follow the dilation diet as instructed for today. You may resume your regular diet tomorrow morning.  Drink plenty of fluids but you should avoid alcoholic beverages for 24 hours.  ACTIVITY: Your care partner should take you home directly after the procedure.  You should plan to take it easy, moving slowly for the rest of the day.  You can resume normal activity the day after the procedure however you should NOT DRIVE or use heavy machinery for 24 hours (because of the sedation medicines used during the test).    SYMPTOMS TO REPORT IMMEDIATELY: A gastroenterologist can be reached at any hour.  During normal business hours, 8:30 AM to 5:00 PM Monday through Friday, call [phone].  After hours and on weekends, please call the GI answering service at [phone] who will take a message and have the physician on call contact you.   Following upper endoscopy (EGD)  Vomiting of blood or coffee ground material  New chest pain or  pain under the shoulder blades  Painful or persistently difficult swallowing  New shortness of breath  Fever of 100F or higher  Black, tarry-looking stools  FOLLOW UP: If any biopsies were taken you will be contacted by phone or by letter within the next 1-3 weeks.  Call your gastroenterologist if you have not heard about the biopsies in 3 weeks.  Our staff will call the home number listed on your records the next business day following your procedure to check on you and address any questions or concerns that you may have at that time regarding the information given to you following your procedure. This is a courtesy call and so if there is no answer at the home number and we have not heard from you through the emergency physician on call, we will assume that you have returned to your regular daily activities without incident.  SIGNATURES/CONFIDENTIALITY: You and/or your care partner have signed paperwork which will be entered into your electronic medical record.  These signatures attest to the fact that that the information above on your After Visit Summary has been reviewed and is understood.  Full responsibility of the confidentiality of this discharge information lies with you and/or your care-partner.   Hand out on gastritis

## 2011-11-03 ENCOUNTER — Telehealth: Payer: Self-pay

## 2011-11-03 NOTE — Telephone Encounter (Signed)
Left a message on the pt's answering machine at [phone] at 7:55 am to call us back if he has any questions or concerns. Maw

## 2011-11-08 ENCOUNTER — Encounter: Payer: Self-pay | Admitting: Gastroenterology

## 2012-03-14 ENCOUNTER — Other Ambulatory Visit: Payer: Self-pay | Admitting: Dermatology

## 2012-04-10 ENCOUNTER — Other Ambulatory Visit: Payer: Self-pay | Admitting: Dermatology

## 2012-07-07 ENCOUNTER — Other Ambulatory Visit: Payer: Self-pay | Admitting: Cardiology

## 2012-07-07 ENCOUNTER — Ambulatory Visit
Admission: RE | Admit: 2012-07-07 | Discharge: 2012-07-07 | Disposition: A | Discharge status: Home / Self Care | Payer: Medicare Other | Source: Ambulatory Visit | Attending: Cardiology | Admitting: Cardiology

## 2012-07-07 DIAGNOSIS — R0602 Shortness of breath: Secondary | ICD-10-CM

## 2012-07-13 ENCOUNTER — Other Ambulatory Visit: Payer: Self-pay | Admitting: Dermatology

## 2012-08-24 ENCOUNTER — Telehealth: Payer: Self-pay | Admitting: *Deleted

## 2012-08-24 NOTE — Telephone Encounter (Signed)
Pt wife called to scheduled the pt an appt /GCM. gv appt d/t for 10/12/12 @ 3:30pm. She is aware...td

## 2012-10-05 ENCOUNTER — Encounter: Payer: Self-pay | Admitting: Gastroenterology

## 2012-10-12 ENCOUNTER — Ambulatory Visit (HOSPITAL_BASED_OUTPATIENT_CLINIC_OR_DEPARTMENT_OTHER): Payer: Medicare Other | Admitting: Oncology

## 2012-10-12 VITALS — BP 135/77 | HR 84 | Temp 98.3°F | Resp 20 | Ht 67.0 in | Wt 170.0 lb

## 2012-10-12 DIAGNOSIS — Z85038 Personal history of other malignant neoplasm of large intestine: Secondary | ICD-10-CM

## 2012-10-12 NOTE — Progress Notes (Signed)
ID: Fannie Knee OB: [birth year]  MR#: [id number]  ION#:[id number]  PCP: Benita Stabile, MD GYN:   SU:  OTHER MD: Shelby Dubin, Camille Bal, Leitha Schuller   INTERVAL HISTORY: Mark [name] returns today for followup of his remote colon and bladder cancers. He felt he had not seen [place] in over 2 years and wondered if any specific followup was needed. He goes to the gym about 4 times a week. 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  REVIEW OF SYSTEMS: He recently had biopsies of is right shoulder, chest, and left upper back, all of which showed basal cell carcinomas. Dermatology follows him every 6 months. He sees Dr. Margo Aye regularly and Mark [name] understands there is always a risk of cancer developing in the ileostomy pouch. He has had no unusual headaches, visual changes, cough, phlegm production, pleurisy, or changes in bowel habits. He has frequent urinary infections. His been no bleeding, rash, fever, or unexplained weight loss or unexplained fatigue. Of course he still has the horrendous peripheral neuropathy which developed from his colon cancer treatments. He manages this with Duragesic and Percocet. A detailed review of systems was otherwise noncontributory  PAST MEDICAL HISTORY: Past Medical History  Diagnosis Date  . PONV (postoperative nausea and vomiting)   . Hyperlipidemia   . GERD (gastroesophageal reflux disease)   . Colon adenocarcinoma 2001    T2, N1  . Chronic kidney disease      BLADDER CANCER 2006  UROSTOMY   . Erosive esophagitis   . Internal hemorrhoids   . Hiatal hernia   . Tubular adenoma of colon 12/2009  . Bladder cancer 2006  . Atrial fibrillation     PAST SURGICAL HISTORY: Past Surgical History  Procedure Laterality Date  . Colon surgery    . Cardiac catheterization    . Cholecystectomy      1999  . Spinal cord stimulator removal  12/29/2010    Procedure: CERVICAL SPINAL CORD STIMULATOR REMOVAL;  Surgeon:  Dorian Heckle, MD;  Location: [facility];  Service: Neurosurgery;  Laterality: N/A;  REMOVAL OF SPINAL CORD STIMULATOR, ELECTRODE AND IPG  . Appendectomy      FAMILY HISTORY Mark [name] father died at the age of 64 from COPD. His mother died at the age of 40 from heart disease. He has one brother in good health. His sister died from heart complications. She had a history of breast cancer as well. 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 His wife Mark [name] is a homemaker. They have a son who is a Environmental consultant here in [place] and a daughter who lives in [place] and is in Administrator, Civil Service. He has 2 grandchildren    ADVANCED DIRECTIVES: In place   HEALTH MAINTENANCE: History  Substance Use Topics  . Smoking status: Never Smoker   . Smokeless tobacco: Never Used  . Alcohol Use: No     Colonoscopy:  PAP:  Bone density:  Lipid panel:  Allergies  Allergen Reactions  . Penicillins Hives and Other (See Comments)    rash    Current Outpatient Prescriptions  Medication Sig Dispense Refill  . aspirin EC 81 MG tablet Take 81 mg by mouth daily.        . DULoxetine (CYMBALTA) 60 MG capsule Take 120 mg by mouth every morning.        . fenofibrate 160 MG  tablet Take 160 mg by mouth at bedtime.        . fentaNYL (DURAGESIC - DOSED MCG/HR) 50 MCG/HR Place 1 patch onto the skin every other day.        . fish oil-omega-3 fatty acids 1000 MG capsule Take 2 g by mouth 2 (two) times daily.        Marland Kitchen HYDROcodone-acetaminophen (LORTAB) 10-500 MG per tablet Take 1 tablet by mouth every 6 (six) hours as needed. For pain.       . Linaclotide (LINZESS) 145 MCG CAPS Take 145 mcg by mouth every other day.      Marland Kitchen LORazepam (ATIVAN) 1 MG tablet Take 1 mg by mouth at bedtime.        . Multiple Vitamins-Minerals (MULTIVITAMINS THER. W/MINERALS) TABS Take 1 tablet by mouth daily.        Marland Kitchen omeprazole (PRILOSEC) 20 MG  capsule Take 2 capsules (40 mg total) by mouth 2 (two) times daily.  60 capsule  11  . pregabalin (LYRICA) 50 MG capsule Take 50 mg by mouth every morning.        . temazepam (RESTORIL) 30 MG capsule Take 30 mg by mouth at bedtime as needed. For sleep.       . traMADol (ULTRAM) 50 MG tablet Take 50 mg by mouth 2 (two) times daily. Maximum dose= 8 tablets per day        No current facility-administered medications for this visit.    OBJECTIVE: Middle-aged white male in no acute distress Filed Vitals:   10/12/12 1457  BP: 135/77  Pulse: 84  Temp: 98.3 F (36.8 C)  Resp: 20     Body mass index is 26.62 kg/(m^2).    ECOG FS:1 - Symptomatic but completely ambulatory  Sclerae unicteric Oropharynx clear No cervical or supraclavicular adenopathy Lungs no rales or rhonchi Heart regular rate and rhythm Abd soft, nontender, positive bowel sounds, no masses palpated, ileostomy and right lower quadrant with clear urine in the bag MSK no focal spinal tenderness, no peripheral edema Neuro: non-focal, well-oriented, appropriate affect Skin: Multiple seborrheic keratoses, but no suspicious lesions noted   LAB RESULTS:  CMP     Component Value Date/Time   NA 139 08/03/2010 0846   K 3.7 08/03/2010 0846   CL 105 08/03/2010 0846   CO2 22 08/03/2010 0846   GLUCOSE 151* 08/03/2010 0846   BUN 19 08/03/2010 0846   CREATININE 1.54* 08/03/2010 0846   CALCIUM 9.7 08/03/2010 0846   PROT 7.5 08/03/2010 0846   ALBUMIN 4.3 08/03/2010 0846   AST 23 08/03/2010 0846   ALT 20 08/03/2010 0846   ALKPHOS 82 08/03/2010 0846   BILITOT 0.4 08/03/2010 0846   GFRNONAA 36* 10/12/2009 1520   GFRAA  Value: 44        The eGFR has been calculated using the MDRD equation. This calculation has not been validated in all clinical situations. eGFR's persistently <60 mL/min signify possible Chronic Kidney Disease.* 10/12/2009 1520    I No results found for this basename: SPEP, UPEP,  kappa and lambda light chains    Lab Results   Component Value Date   WBC 6.4 12/23/2010   NEUTROABS 1.9 08/03/2010   HGB 14.2 12/23/2010   HCT 41.1 12/23/2010   MCV 86.2 12/23/2010   PLT 197 12/23/2010      Chemistry      Component Value Date/Time   NA 139 08/03/2010 0846   K 3.7 08/03/2010 0846   CL 105 08/03/2010  0846   CO2 22 08/03/2010 0846   BUN 19 08/03/2010 0846   CREATININE 1.54* 08/03/2010 0846      Component Value Date/Time   CALCIUM 9.7 08/03/2010 0846   ALKPHOS 82 08/03/2010 0846   AST 23 08/03/2010 0846   ALT 20 08/03/2010 0846   BILITOT 0.4 08/03/2010 0846       No results found for this basename: LABCA2    No components found with this basename: LABCA125    No results found for this basename: INR,  in the last 168 hours  Urinalysis    Component Value Date/Time   COLORURINE YELLOW 10/12/2009 1457   APPEARANCEUR CLOUDY* 10/12/2009 1457   LABSPEC 1.014 10/12/2009 1457   PHURINE 7.0 10/12/2009 1457   GLUCOSEU NEGATIVE 10/12/2009 1457   HGBUR MODERATE* 10/12/2009 1457   BILIRUBINUR NEGATIVE 10/12/2009 1457   KETONESUR NEGATIVE 10/12/2009 1457   PROTEINUR NEGATIVE 10/12/2009 1457   UROBILINOGEN 0.2 10/12/2009 1457   NITRITE POSITIVE* 10/12/2009 1457   LEUKOCYTESUR LARGE* 10/12/2009 1457    STUDIES: No results found.  ASSESSMENT: 62 y.o. [place] man   (1) status post sigmoid colectomy July 2001 for a T2 N1, grade 2 colon cancer, treated adjuvantly with 5-FU/leucovorin/oxaliplatin as per NSABP C-7, all treatment completed in December 2001, with no evidence of recurrence.   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   PLAN: Kalem is doing fine and there is no evidence of recurrence of either of his remote cancers. I don't think he needs routine followup here. He knows that we'll be glad  to see him at any point if and when the need arises. Accordingly at this point no further appointments are being made for him here.  Lowella Dell, MD   10/12/2012 3:00 PM

## 2012-10-25 ENCOUNTER — Encounter: Payer: Self-pay | Admitting: Gastroenterology

## 2012-12-22 ENCOUNTER — Ambulatory Visit (AMBULATORY_SURGERY_CENTER): Payer: Self-pay | Admitting: *Deleted

## 2012-12-22 VITALS — Ht 67.0 in | Wt 174.4 lb

## 2012-12-22 DIAGNOSIS — Z85038 Personal history of other malignant neoplasm of large intestine: Secondary | ICD-10-CM

## 2012-12-22 MED ORDER — MOVIPREP 100 G PO SOLR: ORAL | Status: DC

## 2012-12-22 NOTE — Progress Notes (Signed)
No allergies to eggs or soy.  History post op nausea and vomiting

## 2012-12-25 ENCOUNTER — Encounter: Payer: Self-pay | Admitting: Gastroenterology

## 2012-12-29 ENCOUNTER — Encounter: Payer: Self-pay | Admitting: Gastroenterology

## 2012-12-29 ENCOUNTER — Ambulatory Visit (AMBULATORY_SURGERY_CENTER): Payer: Medicare Other | Admitting: Gastroenterology

## 2012-12-29 VITALS — BP 131/72 | HR 73 | Temp 97.5°F | Resp 13 | Ht 67.0 in | Wt 174.0 lb

## 2012-12-29 DIAGNOSIS — Z85038 Personal history of other malignant neoplasm of large intestine: Secondary | ICD-10-CM

## 2012-12-29 MED ORDER — SODIUM CHLORIDE 0.9 % IV SOLN: 500.0000 mL | INTRAVENOUS | Status: DC

## 2012-12-29 NOTE — Progress Notes (Signed)
Procedure ends, to recovery, report given and VSS. 

## 2012-12-29 NOTE — Patient Instructions (Signed)
YOU HAD AN ENDOSCOPIC PROCEDURE TODAY AT THE [facility]: Refer to the procedure report that was given to you for any specific questions about what was found during the examination.  If the procedure report does not answer your questions, please call your gastroenterologist to clarify.  If you requested that your care partner not be given the details of your procedure findings, then the procedure report has been included in a sealed envelope for you to review at your convenience later.  YOU SHOULD EXPECT: Some feelings of bloating in the abdomen. Passage of more gas than usual.  Walking can help get rid of the air that was put into your GI tract during the procedure and reduce the bloating. If you had a lower endoscopy (such as a colonoscopy or flexible sigmoidoscopy) you may notice spotting of blood in your stool or on the toilet paper. If you underwent a bowel prep for your procedure, then you may not have a normal bowel movement for a few days.  DIET: Your first meal following the procedure should be a light meal and then it is ok to progress to your normal diet.  A half-sandwich or bowl of soup is an example of a good first meal.  Heavy or fried foods are harder to digest and may make you feel nauseous or bloated.  Likewise meals heavy in dairy and vegetables can cause extra gas to form and this can also increase the bloating.  Drink plenty of fluids but you should avoid alcoholic beverages for 24 hours.  ACTIVITY: Your care partner should take you home directly after the procedure.  You should plan to take it easy, moving slowly for the rest of the day.  You can resume normal activity the day after the procedure however you should NOT DRIVE or use heavy machinery for 24 hours (because of the sedation medicines used during the test).    SYMPTOMS TO REPORT IMMEDIATELY: A gastroenterologist can be reached at any hour.  During normal business hours, 8:30 AM to 5:00 PM Monday through Friday,  call [phone].  After hours and on weekends, please call the GI answering service at [phone] who will take a message and have the physician on call contact you.   Following lower endoscopy (colonoscopy or flexible sigmoidoscopy):  Excessive amounts of blood in the stool  Significant tenderness or worsening of abdominal pains  Swelling of the abdomen that is new, acute  Fever of 100F or higher  FOLLOW UP: If any biopsies were taken you will be contacted by phone or by letter within the next 1-3 weeks.  Call your gastroenterologist if you have not heard about the biopsies in 3 weeks.  Our staff will call the home number listed on your records the next business day following your procedure to check on you and address any questions or concerns that you may have at that time regarding the information given to you following your procedure. This is a courtesy call and so if there is no answer at the home number and we have not heard from you through the emergency physician on call, we will assume that you have returned to your regular daily activities without incident.  SIGNATURES/CONFIDENTIALITY: You and/or your care partner have signed paperwork which will be entered into your electronic medical record.  These signatures attest to the fact that that the information above on your After Visit Summary has been reviewed and is understood.  Full responsibility of the confidentiality of this   discharge information lies with you and/or your care-partner.  Recommendations See procedure report  

## 2012-12-29 NOTE — Progress Notes (Signed)
Patient did not have preoperative order for IV antibiotic SSI prophylaxis. (G8918)  Patient did not experience any of the following events: a burn prior to discharge; a fall within the facility; wrong site/side/patient/procedure/implant event; or a hospital transfer or hospital admission upon discharge from the facility. (G8907)  

## 2012-12-29 NOTE — Op Note (Signed)
[facility] [address] [place], [address]   COLONOSCOPY PROCEDURE REPORT  PATIENT: Mark, [name]  MR#: [id number] BIRTHDATE: 1950/05/25 , 62  yrs. old GENDER: Male ENDOSCOPIST: Meryl Dare, MD, [facility] PROCEDURE DATE:  12/29/2012 PROCEDURE:   Colonoscopy, surveillance First Screening Colonoscopy - Avg.  risk and is 50 yrs.  old or older - No.  Prior Negative Screening - Now for repeat screening. N/A  History of Adenoma - Now for follow-up colonoscopy & has been > or = to 3 yrs.  N/A  Polyps Removed Today? No.  Recommend repeat exam, <10 yrs? Yes.  High risk (family or personal hx). ASA CLASS:   Class II INDICATIONS:High risk patient with personal history of colon cancer and T2, N1 in 2001. MEDICATIONS: MAC sedation, administered by CRNA and propofol (Diprivan) 230mg  IV DESCRIPTION OF PROCEDURE:   After the risks benefits and alternatives of the procedure were thoroughly explained, informed consent was obtained.  A digital rectal exam revealed no abnormalities of the rectum.   The LB [id number]  endoscope was introduced through the anus and advanced to the cecum, which was identified by both the appendix and ileocecal valve. No adverse events experienced.   The quality of the prep was adequate, using MoviPrep  The instrument was then slowly withdrawn as the colon was fully examined.  COLON FINDINGS: There was evidence of a prior colo-colonic surgical anastomosis in the sigmoid colon.  Prior sigmoid colectomy. The colon was otherwise normal.  There was no diverticulosis, inflammation, polyps or cancers unless previously stated. Retroflexed views revealed small internal hemorrhoids. The time to cecum=1 minutes 53 seconds.  Withdrawal time=9 minutes 07 seconds. The scope was withdrawn and the procedure completed.  COMPLICATIONS: There were no complications.  ENDOSCOPIC IMPRESSION: 1.   Prior colo-colonic surgical anastomosis in the sigmoid  colon 2.   Small internal hemorrhoids  RECOMMENDATIONS: 1.  Repeat Colonoscopy in 3 years with a more extensive bowel prep.  eSigned:  Meryl Dare, MD, [facility] 12/29/2012 2:01 PM   cc: Lupe Carney, MD

## 2013-01-01 ENCOUNTER — Telehealth: Payer: Self-pay | Admitting: *Deleted

## 2013-01-01 NOTE — Telephone Encounter (Signed)
Left message that we called for f/u 

## 2013-01-16 ENCOUNTER — Other Ambulatory Visit: Payer: Self-pay | Admitting: Dermatology

## 2013-10-10 ENCOUNTER — Other Ambulatory Visit: Payer: Self-pay | Admitting: Dermatology

## 2013-10-10 DIAGNOSIS — R319 Hematuria, unspecified: Secondary | ICD-10-CM | POA: Insufficient documentation

## 2013-10-10 DIAGNOSIS — N289 Disorder of kidney and ureter, unspecified: Secondary | ICD-10-CM | POA: Insufficient documentation

## 2013-10-10 DIAGNOSIS — H9319 Tinnitus, unspecified ear: Secondary | ICD-10-CM | POA: Insufficient documentation

## 2013-10-10 DIAGNOSIS — J343 Hypertrophy of nasal turbinates: Secondary | ICD-10-CM | POA: Insufficient documentation

## 2013-10-10 DIAGNOSIS — H9190 Unspecified hearing loss, unspecified ear: Secondary | ICD-10-CM | POA: Insufficient documentation

## 2013-10-10 DIAGNOSIS — H905 Unspecified sensorineural hearing loss: Secondary | ICD-10-CM | POA: Insufficient documentation

## 2013-10-10 DIAGNOSIS — E039 Hypothyroidism, unspecified: Secondary | ICD-10-CM | POA: Insufficient documentation

## 2013-10-10 DIAGNOSIS — F528 Other sexual dysfunction not due to a substance or known physiological condition: Secondary | ICD-10-CM | POA: Insufficient documentation

## 2013-10-10 DIAGNOSIS — N50811 Right testicular pain: Secondary | ICD-10-CM | POA: Insufficient documentation

## 2013-10-10 DIAGNOSIS — A088 Other specified intestinal infections: Secondary | ICD-10-CM | POA: Insufficient documentation

## 2013-10-10 DIAGNOSIS — E7800 Pure hypercholesterolemia, unspecified: Secondary | ICD-10-CM | POA: Insufficient documentation

## 2013-10-10 DIAGNOSIS — J342 Deviated nasal septum: Secondary | ICD-10-CM | POA: Insufficient documentation

## 2013-10-10 DIAGNOSIS — N390 Urinary tract infection, site not specified: Secondary | ICD-10-CM | POA: Insufficient documentation

## 2013-10-10 DIAGNOSIS — C679 Malignant neoplasm of bladder, unspecified: Secondary | ICD-10-CM | POA: Insufficient documentation

## 2013-10-10 DIAGNOSIS — N529 Male erectile dysfunction, unspecified: Secondary | ICD-10-CM | POA: Insufficient documentation

## 2013-11-01 ENCOUNTER — Encounter: Payer: Self-pay | Admitting: Gastroenterology

## 2013-11-05 ENCOUNTER — Other Ambulatory Visit: Payer: Self-pay | Admitting: Dermatology

## 2014-04-15 ENCOUNTER — Other Ambulatory Visit: Payer: Self-pay | Admitting: Dermatology

## 2015-11-25 ENCOUNTER — Ambulatory Visit
Admission: RE | Admit: 2015-11-25 | Discharge: 2015-11-25 | Disposition: A | Discharge status: Home / Self Care | Payer: Medicare Other | Source: Ambulatory Visit | Attending: Cardiology | Admitting: Cardiology

## 2015-11-25 ENCOUNTER — Other Ambulatory Visit: Payer: Self-pay | Admitting: Cardiology

## 2015-11-25 DIAGNOSIS — R072 Precordial pain: Secondary | ICD-10-CM

## 2015-12-05 ENCOUNTER — Encounter: Payer: Self-pay | Admitting: Gastroenterology

## 2016-01-28 ENCOUNTER — Encounter: Payer: Self-pay | Admitting: Gastroenterology

## 2016-03-26 ENCOUNTER — Ambulatory Visit (AMBULATORY_SURGERY_CENTER): Payer: Self-pay

## 2016-03-26 ENCOUNTER — Other Ambulatory Visit: Payer: Medicare Other

## 2016-03-26 ENCOUNTER — Encounter: Payer: Self-pay | Admitting: Gastroenterology

## 2016-03-26 VITALS — Ht 66.5 in | Wt 185.4 lb

## 2016-03-26 DIAGNOSIS — C187 Malignant neoplasm of sigmoid colon: Secondary | ICD-10-CM

## 2016-03-26 MED ORDER — SUPREP BOWEL PREP KIT 17.5-3.13-1.6 GM/177ML PO SOLN: 1.0000 | Freq: Once | ORAL | 0 refills | Status: AC

## 2016-03-26 NOTE — Progress Notes (Signed)
Patient ID: Mark [name], male   DOB: 1950-09-08, 66 y.o.   MRN: [id number] No allergies to eggs or soy No diet meds No hoeme oxygen No past problems with anesthesia  Declined emmi

## 2016-04-06 ENCOUNTER — Encounter: Payer: Medicare Other | Admitting: Gastroenterology

## 2016-04-06 ENCOUNTER — Encounter: Payer: Self-pay | Admitting: Gastroenterology

## 2016-04-06 ENCOUNTER — Ambulatory Visit (AMBULATORY_SURGERY_CENTER): Payer: Medicare Other | Admitting: Gastroenterology

## 2016-04-06 VITALS — BP 142/84 | HR 75 | Temp 99.6°F | Resp 12 | Ht 66.5 in | Wt 185.0 lb

## 2016-04-06 DIAGNOSIS — D120 Benign neoplasm of cecum: Secondary | ICD-10-CM | POA: Diagnosis not present

## 2016-04-06 DIAGNOSIS — K621 Rectal polyp: Secondary | ICD-10-CM | POA: Diagnosis not present

## 2016-04-06 DIAGNOSIS — Z85038 Personal history of other malignant neoplasm of large intestine: Secondary | ICD-10-CM

## 2016-04-06 DIAGNOSIS — K635 Polyp of colon: Secondary | ICD-10-CM

## 2016-04-06 DIAGNOSIS — D128 Benign neoplasm of rectum: Secondary | ICD-10-CM

## 2016-04-06 DIAGNOSIS — D126 Benign neoplasm of colon, unspecified: Secondary | ICD-10-CM

## 2016-04-06 DIAGNOSIS — D129 Benign neoplasm of anus and anal canal: Secondary | ICD-10-CM

## 2016-04-06 MED ORDER — SODIUM CHLORIDE 0.9 % IV SOLN: 500.0000 mL | INTRAVENOUS | Status: DC

## 2016-04-06 NOTE — Patient Instructions (Signed)
Discharge instructions given. Handout on polyps. Resume previous medications. YOU HAD AN ENDOSCOPIC PROCEDURE TODAY AT THE [facility]:   Refer to the procedure report that was given to you for any specific questions about what was found during the examination.  If the procedure report does not answer your questions, please call your gastroenterologist to clarify.  If you requested that your care partner not be given the details of your procedure findings, then the procedure report has been included in a sealed envelope for you to review at your convenience later.  YOU SHOULD EXPECT: Some feelings of bloating in the abdomen. Passage of more gas than usual.  Walking can help get rid of the air that was put into your GI tract during the procedure and reduce the bloating. If you had a lower endoscopy (such as a colonoscopy or flexible sigmoidoscopy) you may notice spotting of blood in your stool or on the toilet paper. If you underwent a bowel prep for your procedure, you may not have a normal bowel movement for a few days.  Please Note:  You might notice some irritation and congestion in your nose or some drainage.  This is from the oxygen used during your procedure.  There is no need for concern and it should clear up in a day or so.  SYMPTOMS TO REPORT IMMEDIATELY:   Following lower endoscopy (colonoscopy or flexible sigmoidoscopy):  Excessive amounts of blood in the stool  Significant tenderness or worsening of abdominal pains  Swelling of the abdomen that is new, acute  Fever of 100F or higher   For urgent or emergent issues, a gastroenterologist can be reached at any hour by calling [phone].   DIET:  We do recommend a small meal at first, but then you may proceed to your regular diet.  Drink plenty of fluids but you should avoid alcoholic beverages for 24 hours.  ACTIVITY:  You should plan to take it easy for the rest of today and you should NOT DRIVE or use heavy  machinery until tomorrow (because of the sedation medicines used during the test).    FOLLOW UP: Our staff will call the number listed on your records the next business day following your procedure to check on you and address any questions or concerns that you may have regarding the information given to you following your procedure. If we do not reach you, we will leave a message.  However, if you are feeling well and you are not experiencing any problems, there is no need to return our call.  We will assume that you have returned to your regular daily activities without incident.  If any biopsies were taken you will be contacted by phone or by letter within the next 1-3 weeks.  Please call us at [phone] if you have not heard about the biopsies in 3 weeks.    SIGNATURES/CONFIDENTIALITY: You and/or your care partner have signed paperwork which will be entered into your electronic medical record.  These signatures attest to the fact that that the information above on your After Visit Summary has been reviewed and is understood.  Full responsibility of the confidentiality of this discharge information lies with you and/or your care-partner. 

## 2016-04-06 NOTE — Progress Notes (Signed)
No changes since previsit. 

## 2016-04-06 NOTE — Op Note (Signed)
[facility] Patient Name: Mark [name] Procedure Date: 04/06/2016 9:25 AM MRN: [id number] Endoscopist: Ladene Artist , MD Age: 66 Referring MD:  Date of Birth: Jul 05, 1950 Gender: Male Account #: [id number] Procedure:                Colonoscopy Indications:              High risk colon cancer surveillance: Personal                            history of colon cancer Medicines:                Monitored Anesthesia Care Procedure:                Pre-Anesthesia Assessment:                           - Prior to the procedure, a History and Physical                            was performed, and patient medications and                            allergies were reviewed. The patient's tolerance of                            previous anesthesia was also reviewed. The risks                            and benefits of the procedure and the sedation                            options and risks were discussed with the patient.                            All questions were answered, and informed consent                            was obtained. Prior Anticoagulants: The patient has                            taken no previous anticoagulant or antiplatelet                            agents. ASA Grade Assessment: III - A patient with                            severe systemic disease. After reviewing the risks                            and benefits, the patient was deemed in                            satisfactory condition to undergo the procedure.  After obtaining informed consent, the colonoscope                            was passed under direct vision. Throughout the                            procedure, the patient's blood pressure, pulse, and                            oxygen saturations were monitored continuously. The                            Model [id number]-[id number]) scope was introduced                            through the anus and advanced to the  the cecum,                            identified by appendiceal orifice and ileocecal                            valve. The ileocecal valve, appendiceal orifice,                            and rectum were photographed. The quality of the                            bowel preparation was good. The colonoscopy was                            performed without difficulty. The patient tolerated                            the procedure well. Scope In: 9:34:44 AM Scope Out: 9:48:23 AM Scope Withdrawal Time: 0 hours 12 minutes 5 seconds  Total Procedure Duration: 0 hours 13 minutes 39 seconds  Findings:                 The perianal and digital rectal examinations were                            normal.                           Two sessile polyps were found in the rectum and                            ileocecal valve. The polyps were 6 mm in size.                            These polyps were removed with a cold snare.                            Resection and retrieval were complete.  There was evidence of a prior end-to-end                            colo-colonic anastomosis in the sigmoid colon. This                            was patent and was characterized by healthy                            appearing mucosa. The anastomosis was traversed.                           The exam was otherwise without abnormality on                            direct and retroflexion views. Complications:            No immediate complications. Estimated blood loss:                            None. Estimated Blood Loss:     Estimated blood loss: none. Impression:               - Two 6 mm polyps in the rectum and at the                            ileocecal valve, removed with a cold snare.                            Resected and retrieved.                           - Patent end-to-end colo-colonic anastomosis,                            characterized by healthy appearing mucosa.                            - The examination was otherwise normal on direct                            and retroflexion views. Recommendation:           - Repeat colonoscopy in 3 years for surveillance.                           - Patient has a contact number available for                            emergencies. The signs and symptoms of potential                            delayed complications were discussed with the                            patient. Return to normal activities tomorrow.  Written discharge instructions were provided to the                            patient.                           - Resume previous diet.                           - Continue present medications.                           - Await pathology results. Ladene Artist, MD 04/06/2016 9:51:13 AM This report has been signed electronically.

## 2016-04-06 NOTE — Progress Notes (Signed)
Called to room to assist during endoscopic procedure.  Patient ID and intended procedure confirmed with present staff. Received instructions for my participation in the procedure from the performing physician.  

## 2016-04-06 NOTE — Progress Notes (Signed)
Report given to PACU, vss 

## 2016-04-07 ENCOUNTER — Telehealth: Payer: Self-pay

## 2016-04-07 NOTE — Telephone Encounter (Signed)
Left a message at [phone] for the pt to callus back if any questions or concerns. maw

## 2016-04-09 ENCOUNTER — Telehealth: Payer: Self-pay

## 2016-04-09 NOTE — Telephone Encounter (Signed)
  Follow up Call-  Call back number 04/06/2016  Post procedure Call Back phone  # [phone]  Permission to leave phone message Yes  Some recent data might be hidden     Patient questions:  Do you have a fever, pain , or abdominal swelling? No. Pain Score  0 *  Have you tolerated food without any problems? Yes.    Have you been able to return to your normal activities? Yes.    Do you have any questions about your discharge instructions: Diet   No. Medications  No. Follow up visit  No.  Do you have questions or concerns about your Care? No.  Actions: * If pain score is 4 or above: No action needed, pain <4.

## 2016-04-21 ENCOUNTER — Encounter: Payer: Self-pay | Admitting: Gastroenterology

## 2017-05-27 ENCOUNTER — Other Ambulatory Visit (HOSPITAL_BASED_OUTPATIENT_CLINIC_OR_DEPARTMENT_OTHER): Payer: Self-pay | Admitting: Physician Assistant

## 2017-05-27 ENCOUNTER — Ambulatory Visit (HOSPITAL_BASED_OUTPATIENT_CLINIC_OR_DEPARTMENT_OTHER)
Admission: RE | Admit: 2017-05-27 | Discharge: 2017-05-27 | Disposition: A | Discharge status: Home / Self Care | Payer: Medicare Other | Source: Ambulatory Visit | Attending: Physician Assistant | Admitting: Physician Assistant

## 2017-05-27 DIAGNOSIS — M7989 Other specified soft tissue disorders: Secondary | ICD-10-CM | POA: Diagnosis not present

## 2018-03-02 DIAGNOSIS — Z85828 Personal history of other malignant neoplasm of skin: Secondary | ICD-10-CM | POA: Diagnosis not present

## 2018-03-02 DIAGNOSIS — D0421 Carcinoma in situ of skin of right ear and external auricular canal: Secondary | ICD-10-CM | POA: Diagnosis not present

## 2018-03-23 DIAGNOSIS — D0421 Carcinoma in situ of skin of right ear and external auricular canal: Secondary | ICD-10-CM | POA: Diagnosis not present

## 2018-03-23 DIAGNOSIS — Z85828 Personal history of other malignant neoplasm of skin: Secondary | ICD-10-CM | POA: Diagnosis not present

## 2018-04-11 DIAGNOSIS — R52 Pain, unspecified: Secondary | ICD-10-CM | POA: Diagnosis not present

## 2018-04-11 DIAGNOSIS — H6123 Impacted cerumen, bilateral: Secondary | ICD-10-CM | POA: Diagnosis not present

## 2018-04-11 DIAGNOSIS — I10 Essential (primary) hypertension: Secondary | ICD-10-CM | POA: Diagnosis not present

## 2018-04-11 DIAGNOSIS — J399 Disease of upper respiratory tract, unspecified: Secondary | ICD-10-CM | POA: Diagnosis not present

## 2018-04-20 DIAGNOSIS — Z936 Other artificial openings of urinary tract status: Secondary | ICD-10-CM | POA: Diagnosis not present

## 2018-04-24 DIAGNOSIS — C44222 Squamous cell carcinoma of skin of right ear and external auricular canal: Secondary | ICD-10-CM | POA: Diagnosis not present

## 2018-04-24 DIAGNOSIS — Z85828 Personal history of other malignant neoplasm of skin: Secondary | ICD-10-CM | POA: Diagnosis not present

## 2018-06-05 DIAGNOSIS — C679 Malignant neoplasm of bladder, unspecified: Secondary | ICD-10-CM | POA: Diagnosis not present

## 2018-06-05 DIAGNOSIS — Z906 Acquired absence of other parts of urinary tract: Secondary | ICD-10-CM | POA: Diagnosis not present

## 2018-06-07 DIAGNOSIS — G4700 Insomnia, unspecified: Secondary | ICD-10-CM | POA: Diagnosis not present

## 2018-06-07 DIAGNOSIS — K219 Gastro-esophageal reflux disease without esophagitis: Secondary | ICD-10-CM | POA: Diagnosis not present

## 2018-06-07 DIAGNOSIS — G629 Polyneuropathy, unspecified: Secondary | ICD-10-CM | POA: Diagnosis not present

## 2018-06-07 DIAGNOSIS — K5900 Constipation, unspecified: Secondary | ICD-10-CM | POA: Diagnosis not present

## 2018-06-07 DIAGNOSIS — E782 Mixed hyperlipidemia: Secondary | ICD-10-CM | POA: Diagnosis not present

## 2018-06-07 DIAGNOSIS — I10 Essential (primary) hypertension: Secondary | ICD-10-CM | POA: Diagnosis not present

## 2018-06-29 DIAGNOSIS — E782 Mixed hyperlipidemia: Secondary | ICD-10-CM | POA: Diagnosis not present

## 2018-07-05 DIAGNOSIS — C679 Malignant neoplasm of bladder, unspecified: Secondary | ICD-10-CM | POA: Diagnosis not present

## 2018-07-12 DIAGNOSIS — N400 Enlarged prostate without lower urinary tract symptoms: Secondary | ICD-10-CM | POA: Diagnosis not present

## 2018-07-12 DIAGNOSIS — N5232 Erectile dysfunction following radical cystectomy: Secondary | ICD-10-CM | POA: Diagnosis not present

## 2018-07-12 DIAGNOSIS — C679 Malignant neoplasm of bladder, unspecified: Secondary | ICD-10-CM | POA: Diagnosis not present

## 2018-07-19 DIAGNOSIS — Z936 Other artificial openings of urinary tract status: Secondary | ICD-10-CM | POA: Diagnosis not present

## 2018-09-04 DIAGNOSIS — N390 Urinary tract infection, site not specified: Secondary | ICD-10-CM | POA: Diagnosis not present

## 2018-09-04 DIAGNOSIS — N179 Acute kidney failure, unspecified: Secondary | ICD-10-CM | POA: Diagnosis not present

## 2018-09-04 DIAGNOSIS — R0602 Shortness of breath: Secondary | ICD-10-CM | POA: Diagnosis not present

## 2018-09-04 DIAGNOSIS — R51 Headache: Secondary | ICD-10-CM | POA: Diagnosis not present

## 2018-09-04 DIAGNOSIS — Z66 Do not resuscitate: Secondary | ICD-10-CM | POA: Diagnosis not present

## 2018-09-04 DIAGNOSIS — E7800 Pure hypercholesterolemia, unspecified: Secondary | ICD-10-CM | POA: Diagnosis not present

## 2018-09-04 DIAGNOSIS — R945 Abnormal results of liver function studies: Secondary | ICD-10-CM | POA: Diagnosis not present

## 2018-09-04 DIAGNOSIS — Z20828 Contact with and (suspected) exposure to other viral communicable diseases: Secondary | ICD-10-CM | POA: Diagnosis not present

## 2018-09-04 DIAGNOSIS — R4182 Altered mental status, unspecified: Secondary | ICD-10-CM | POA: Diagnosis not present

## 2018-09-04 DIAGNOSIS — B9620 Unspecified Escherichia coli [E. coli] as the cause of diseases classified elsewhere: Secondary | ICD-10-CM | POA: Diagnosis not present

## 2018-09-04 DIAGNOSIS — I129 Hypertensive chronic kidney disease with stage 1 through stage 4 chronic kidney disease, or unspecified chronic kidney disease: Secondary | ICD-10-CM | POA: Diagnosis not present

## 2018-09-04 DIAGNOSIS — R509 Fever, unspecified: Secondary | ICD-10-CM | POA: Diagnosis not present

## 2018-09-04 DIAGNOSIS — I440 Atrioventricular block, first degree: Secondary | ICD-10-CM | POA: Diagnosis not present

## 2018-09-04 DIAGNOSIS — E872 Acidosis: Secondary | ICD-10-CM | POA: Diagnosis not present

## 2018-09-04 DIAGNOSIS — T83598A Infection and inflammatory reaction due to other prosthetic device, implant and graft in urinary system, initial encounter: Secondary | ICD-10-CM | POA: Diagnosis not present

## 2018-09-04 DIAGNOSIS — E871 Hypo-osmolality and hyponatremia: Secondary | ICD-10-CM | POA: Diagnosis not present

## 2018-09-04 DIAGNOSIS — R0789 Other chest pain: Secondary | ICD-10-CM | POA: Diagnosis not present

## 2018-09-04 DIAGNOSIS — I10 Essential (primary) hypertension: Secondary | ICD-10-CM | POA: Diagnosis not present

## 2018-09-04 DIAGNOSIS — R079 Chest pain, unspecified: Secondary | ICD-10-CM | POA: Diagnosis not present

## 2018-09-04 DIAGNOSIS — R7989 Other specified abnormal findings of blood chemistry: Secondary | ICD-10-CM | POA: Diagnosis not present

## 2018-09-04 DIAGNOSIS — N183 Chronic kidney disease, stage 3 (moderate): Secondary | ICD-10-CM | POA: Diagnosis not present

## 2018-09-19 DIAGNOSIS — I10 Essential (primary) hypertension: Secondary | ICD-10-CM | POA: Diagnosis not present

## 2018-09-19 DIAGNOSIS — N3090 Cystitis, unspecified without hematuria: Secondary | ICD-10-CM | POA: Diagnosis not present

## 2018-09-19 DIAGNOSIS — N183 Chronic kidney disease, stage 3 (moderate): Secondary | ICD-10-CM | POA: Diagnosis not present

## 2018-09-19 DIAGNOSIS — E782 Mixed hyperlipidemia: Secondary | ICD-10-CM | POA: Diagnosis not present

## 2018-09-19 DIAGNOSIS — G629 Polyneuropathy, unspecified: Secondary | ICD-10-CM | POA: Diagnosis not present

## 2018-09-19 DIAGNOSIS — R748 Abnormal levels of other serum enzymes: Secondary | ICD-10-CM | POA: Diagnosis not present

## 2018-09-22 DIAGNOSIS — N3090 Cystitis, unspecified without hematuria: Secondary | ICD-10-CM | POA: Diagnosis not present

## 2018-09-22 DIAGNOSIS — N183 Chronic kidney disease, stage 3 (moderate): Secondary | ICD-10-CM | POA: Diagnosis not present

## 2018-10-09 DIAGNOSIS — N3090 Cystitis, unspecified without hematuria: Secondary | ICD-10-CM | POA: Diagnosis not present

## 2018-10-09 DIAGNOSIS — N183 Chronic kidney disease, stage 3 (moderate): Secondary | ICD-10-CM | POA: Diagnosis not present

## 2018-10-16 DIAGNOSIS — Z936 Other artificial openings of urinary tract status: Secondary | ICD-10-CM | POA: Diagnosis not present

## 2018-10-24 DIAGNOSIS — D485 Neoplasm of uncertain behavior of skin: Secondary | ICD-10-CM | POA: Diagnosis not present

## 2018-10-24 DIAGNOSIS — C44212 Basal cell carcinoma of skin of right ear and external auricular canal: Secondary | ICD-10-CM | POA: Diagnosis not present

## 2018-10-24 DIAGNOSIS — L820 Inflamed seborrheic keratosis: Secondary | ICD-10-CM | POA: Diagnosis not present

## 2018-10-24 DIAGNOSIS — L570 Actinic keratosis: Secondary | ICD-10-CM | POA: Diagnosis not present

## 2018-10-24 DIAGNOSIS — Z85828 Personal history of other malignant neoplasm of skin: Secondary | ICD-10-CM | POA: Diagnosis not present

## 2018-10-24 DIAGNOSIS — C4441 Basal cell carcinoma of skin of scalp and neck: Secondary | ICD-10-CM | POA: Diagnosis not present

## 2018-10-27 DIAGNOSIS — N183 Chronic kidney disease, stage 3 (moderate): Secondary | ICD-10-CM | POA: Diagnosis not present

## 2018-10-27 DIAGNOSIS — R826 Abnormal urine levels of substances chiefly nonmedicinal as to source: Secondary | ICD-10-CM | POA: Diagnosis not present

## 2018-10-27 DIAGNOSIS — Z23 Encounter for immunization: Secondary | ICD-10-CM | POA: Diagnosis not present

## 2018-11-08 DIAGNOSIS — C44212 Basal cell carcinoma of skin of right ear and external auricular canal: Secondary | ICD-10-CM | POA: Diagnosis not present

## 2018-11-08 DIAGNOSIS — Z85828 Personal history of other malignant neoplasm of skin: Secondary | ICD-10-CM | POA: Diagnosis not present

## 2018-11-22 DIAGNOSIS — L570 Actinic keratosis: Secondary | ICD-10-CM | POA: Diagnosis not present

## 2018-12-07 DIAGNOSIS — K219 Gastro-esophageal reflux disease without esophagitis: Secondary | ICD-10-CM | POA: Diagnosis not present

## 2018-12-07 DIAGNOSIS — E782 Mixed hyperlipidemia: Secondary | ICD-10-CM | POA: Diagnosis not present

## 2018-12-07 DIAGNOSIS — I10 Essential (primary) hypertension: Secondary | ICD-10-CM | POA: Diagnosis not present

## 2018-12-07 DIAGNOSIS — K5900 Constipation, unspecified: Secondary | ICD-10-CM | POA: Diagnosis not present

## 2018-12-07 DIAGNOSIS — G629 Polyneuropathy, unspecified: Secondary | ICD-10-CM | POA: Diagnosis not present

## 2018-12-07 DIAGNOSIS — G4700 Insomnia, unspecified: Secondary | ICD-10-CM | POA: Diagnosis not present

## 2019-01-05 DIAGNOSIS — C679 Malignant neoplasm of bladder, unspecified: Secondary | ICD-10-CM | POA: Diagnosis not present

## 2019-01-21 DIAGNOSIS — Z936 Other artificial openings of urinary tract status: Secondary | ICD-10-CM | POA: Diagnosis not present

## 2019-03-01 ENCOUNTER — Other Ambulatory Visit: Payer: Self-pay

## 2019-03-01 ENCOUNTER — Ambulatory Visit: Payer: Medicare Other | Admitting: Podiatry

## 2019-03-01 DIAGNOSIS — M79676 Pain in unspecified toe(s): Secondary | ICD-10-CM | POA: Diagnosis not present

## 2019-03-01 DIAGNOSIS — L600 Ingrowing nail: Secondary | ICD-10-CM

## 2019-03-01 NOTE — Patient Instructions (Signed)

## 2019-03-15 ENCOUNTER — Other Ambulatory Visit: Payer: Self-pay

## 2019-03-15 ENCOUNTER — Ambulatory Visit (INDEPENDENT_AMBULATORY_CARE_PROVIDER_SITE_OTHER): Payer: Medicare HMO | Admitting: Podiatry

## 2019-03-15 DIAGNOSIS — L600 Ingrowing nail: Secondary | ICD-10-CM

## 2019-03-15 NOTE — Progress Notes (Signed)
  Subjective:  Patient ID: Mark [name], male    DOB: 1951/01/13,  MRN: [id number]  Chief Complaint  Patient presents with  . Nail Problem    Pt states nail is healing well, pt has no concerns and no pain.    69 y.o. male presents for follow up of nail procedure. History confirmed with patient.   Objective:  Physical Exam: Ingrown nail avulsion site: overlying soft crust, no warmth, no drainage and no erythema Assessment:   1. Ingrown nail      Plan:  Patient was evaluated and treated and all questions answered.  S/p Ingrown Toenail Excision, right -Healing well without issue. -Discussed return precautions. -F/u PRN

## 2019-04-13 ENCOUNTER — Encounter: Payer: Self-pay | Admitting: Gastroenterology

## 2019-04-20 DIAGNOSIS — Z936 Other artificial openings of urinary tract status: Secondary | ICD-10-CM | POA: Diagnosis not present

## 2019-05-16 NOTE — Progress Notes (Signed)
  Subjective:  Patient ID: Mark [name], male    DOB: 1950-10-23,  MRN: [id number]  Chief Complaint  Patient presents with  . Nail Problem    R hallux. Thickness and discoloration. x"Months". Pt stated, "It started as an ingrown nail (medial border). Another thick nail grew on top of it. Doesn't really hurt. Pus came out a couple of months ago. Since then, I've used Peroxide every other day. 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    69 y.o. male presents with the above complaint. History confirmed with patient.   Objective:  Physical Exam: warm, good capillary refill, no trophic changes or ulcerative lesions, normal DP and PT pulses and normal sensory exam.  Painful thick nail right, hallux; without warmth, erythema or drainage  Assessment:   1. Ingrown nail   2. Pain around toenail      Plan:  Patient was evaluated and treated and all questions answered.  Ingrown Nail, right -Patient elects to proceed with ingrown toenail removal today -Nail avulsed. See procedure note. -Educated on post-procedure care including soaking. Written instructions provided. -Patient to follow up in 2 weeks for nail check.  Procedure: Avulsion of toenail Location: Right 1st toe  Anesthesia: Lidocaine 1% plain; 1.5 mL and Marcaine 0.5% plain; 1.5 mL, digital block. Skin Prep: Betadine. Dressing: Silvadene; telfa; dry, sterile, compression dressing. Technique: Following skin prep, the toe was exsanguinated and a tourniquet was secured at the base of the toe. The nail was freed and avulsed with a hemostat. The area was cleansed. The tourniquet was then removed and sterile dressing applied. Disposition: Patient tolerated procedure well.   No follow-ups on file.   MDM

## 2019-05-23 ENCOUNTER — Ambulatory Visit (AMBULATORY_SURGERY_CENTER): Payer: Self-pay | Admitting: *Deleted

## 2019-05-23 ENCOUNTER — Other Ambulatory Visit: Payer: Self-pay

## 2019-05-23 VITALS — Temp 97.7°F | Ht 67.0 in | Wt 195.0 lb

## 2019-05-23 DIAGNOSIS — Z85038 Personal history of other malignant neoplasm of large intestine: Secondary | ICD-10-CM

## 2019-05-23 DIAGNOSIS — Z86010 Personal history of colonic polyps: Secondary | ICD-10-CM

## 2019-05-23 MED ORDER — NA SULFATE-K SULFATE-MG SULF 17.5-3.13-1.6 GM/177ML PO SOLN: 1.0000 | Freq: Once | ORAL | 0 refills | Status: AC

## 2019-05-23 NOTE — Progress Notes (Signed)

## 2019-06-06 ENCOUNTER — Telehealth: Payer: Self-pay | Admitting: Gastroenterology

## 2019-06-06 NOTE — Telephone Encounter (Signed)
Dr Brandt Loosen from [facility] cell [phone] . Please call today.

## 2019-06-06 NOTE — Telephone Encounter (Signed)
Returned call and reviewed reasons for colonoscopy at 3 years: personal history of colon cancer with follow up colonoscopies showing recurrent adenomatous colon polyps including his last colonoscopy in Feb. 2018.

## 2019-06-07 ENCOUNTER — Ambulatory Visit (AMBULATORY_SURGERY_CENTER): Payer: Medicare HMO | Admitting: Gastroenterology

## 2019-06-07 ENCOUNTER — Other Ambulatory Visit: Payer: Self-pay

## 2019-06-07 ENCOUNTER — Encounter: Payer: Self-pay | Admitting: Gastroenterology

## 2019-06-07 VITALS — BP 100/61 | HR 81 | Temp 97.8°F | Resp 16 | Ht 67.0 in | Wt 195.0 lb

## 2019-06-07 DIAGNOSIS — D124 Benign neoplasm of descending colon: Secondary | ICD-10-CM | POA: Diagnosis not present

## 2019-06-07 DIAGNOSIS — Z86010 Personal history of colonic polyps: Secondary | ICD-10-CM

## 2019-06-07 DIAGNOSIS — D123 Benign neoplasm of transverse colon: Secondary | ICD-10-CM

## 2019-06-07 DIAGNOSIS — D122 Benign neoplasm of ascending colon: Secondary | ICD-10-CM | POA: Diagnosis not present

## 2019-06-07 DIAGNOSIS — D120 Benign neoplasm of cecum: Secondary | ICD-10-CM

## 2019-06-07 DIAGNOSIS — Z85038 Personal history of other malignant neoplasm of large intestine: Secondary | ICD-10-CM

## 2019-06-07 MED ORDER — SODIUM CHLORIDE 0.9 % IV SOLN: 500.0000 mL | Freq: Once | INTRAVENOUS | Status: DC

## 2019-06-07 NOTE — Op Note (Signed)
[facility] Patient Name: Mark [name] Procedure Date: 06/07/2019 8:54 AM MRN: [id number] Endoscopist: Ladene Artist , MD Age: 69 Referring MD:  Date of Birth: 05-04-1950 Gender: Male Account #: [id number] Procedure:                Colonoscopy Indications:              Surveillance: Personal history of adenomatous                            polyps on last colonoscopy 3 years ago. Personal                            history of colon cancer. Medicines:                Monitored Anesthesia Care Procedure:                Pre-Anesthesia Assessment:                           - Prior to the procedure, a History and Physical                            was performed, and patient medications and                            allergies were reviewed. The patient's tolerance of                            previous anesthesia was also reviewed. The risks                            and benefits of the procedure and the sedation                            options and risks were discussed with the patient.                            All questions were answered, and informed consent                            was obtained. Prior Anticoagulants: The patient has                            taken no previous anticoagulant or antiplatelet                            agents. ASA Grade Assessment: II - A patient with                            mild systemic disease. After reviewing the risks                            and benefits, the patient was deemed in  satisfactory condition to undergo the procedure.                           After obtaining informed consent, the colonoscope                            was passed under direct vision. Throughout the                            procedure, the patient's blood pressure, pulse, and                            oxygen saturations were monitored continuously. The                            Colonoscope was introduced through the  anus and                            advanced to the the cecum, identified by                            appendiceal orifice and ileocecal valve. The                            ileocecal valve, appendiceal orifice, and rectum                            were photographed. The quality of the bowel                            preparation was inadequate despite extensive lavage                            and suction. The colonoscopy was performed without                            difficulty. The patient tolerated the procedure                            well. Scope In: 9:07:48 AM Scope Out: 9:31:10 AM Scope Withdrawal Time: 0 hours 20 minutes 44 seconds  Total Procedure Duration: 0 hours 23 minutes 22 seconds  Findings:                 The perianal and digital rectal examinations were                            normal.                           Eight sessile polyps were found in the descending                            colon (3), transverse colon (3) and ascending colon                            (  2). The polyps were 7 to 8 mm in size. These                            polyps were removed with a cold snare. Resection                            and retrieval were complete.                           Two sessile polyps were found in the cecum. The                            polyps were 4 mm in size. These polyps were removed                            with a cold biopsy forceps. Resection and retrieval                            were complete.                           There was evidence of a prior end-to-end                            colo-colonic anastomosis in the sigmoid colon. This                            was patent and was characterized by healthy                            appearing mucosa. The anastomosis was traversed.                           The exam was otherwise without abnormality on                            direct and retroflexion views. Complications:            No  immediate complications. Estimated blood loss:                            None. Estimated Blood Loss:     Estimated blood loss: none. Impression:               - Preparation of the colon was inadequate.                           - Eight 7 to 8 mm polyps in the descending colon,                            in the transverse colon and in the ascending colon,                            removed with a cold snare. Resected and retrieved.                           -  Two 4 mm polyps in the cecum, removed with a cold                            biopsy forceps. Resected and retrieved.                           - Patent end-to-end colo-colonic anastomosis,                            characterized by healthy appearing mucosa.                           - The examination was otherwise normal on direct                            and retroflexion views. Recommendation:           - Repeat colonoscopy with a more extensive bowel                            prep in 1 year.                           - Patient has a contact number available for                            emergencies. The signs and symptoms of potential                            delayed complications were discussed with the                            patient. Return to normal activities tomorrow.                            Written discharge instructions were provided to the                            patient.                           - Resume previous diet.                           - Continue present medications.                           - Await pathology results. Ladene Artist, MD 06/07/2019 9:37:57 AM This report has been signed electronically.

## 2019-06-07 NOTE — Progress Notes (Signed)
Called to room to assist during endoscopic procedure.  Patient ID and intended procedure confirmed with present staff. Received instructions for my participation in the procedure from the performing physician.  

## 2019-06-07 NOTE — Patient Instructions (Signed)
HANDOUTS PROVIDED ON: POLYPS  The polyps removed today have been sent for pathology.  The results can take 1-3 weeks to receive.  When your next colonoscopy should occur in 1 year.    You may resume your previous diet and medication schedule.  Thank you for allowing [place] to care for you today!!!   YOU HAD AN ENDOSCOPIC PROCEDURE TODAY AT [facility]:   Refer to the procedure report that was given to you for any specific questions about what was found during the examination.  If the procedure report does not answer your questions, please call your gastroenterologist to clarify.  If you requested that your care partner not be given the details of your procedure findings, then the procedure report has been included in a sealed envelope for you to review at your convenience later.  YOU SHOULD EXPECT: Some feelings of bloating in the abdomen. Passage of more gas than usual.  Walking can help get rid of the air that was put into your GI tract during the procedure and reduce the bloating. If you had a lower endoscopy (such as a colonoscopy or flexible sigmoidoscopy) you may notice spotting of blood in your stool or on the toilet paper. If you underwent a bowel prep for your procedure, you may not have a normal bowel movement for a few days.  Please Note:  You might notice some irritation and congestion in your nose or some drainage.  This is from the oxygen used during your procedure.  There is no need for concern and it should clear up in a day or so.  SYMPTOMS TO REPORT IMMEDIATELY:   Following lower endoscopy (colonoscopy or flexible sigmoidoscopy):  Excessive amounts of blood in the stool  Significant tenderness or worsening of abdominal pains  Swelling of the abdomen that is new, acute  Fever of 100F or higher  For urgent or emergent issues, a gastroenterologist can be reached at any hour by calling [phone]. Do not use MyChart messaging for urgent concerns.    DIET:   We do recommend a small meal at first, but then you may proceed to your regular diet.  Drink plenty of fluids but you should avoid alcoholic beverages for 24 hours.  ACTIVITY:  You should plan to take it easy for the rest of today and you should NOT DRIVE or use heavy machinery until tomorrow (because of the sedation medicines used during the test).    FOLLOW UP: Our staff will call the number listed on your records 48-72 hours following your procedure to check on you and address any questions or concerns that you may have regarding the information given to you following your procedure. If we do not reach you, we will leave a message.  We will attempt to reach you two times.  During this call, we will ask if you have developed any symptoms of COVID 19. If you develop any symptoms (ie: fever, flu-like symptoms, shortness of breath, cough etc.) before then, please call [phone].  If you test positive for Covid 19 in the 2 weeks post procedure, please call and report this information to [place].    If any biopsies were taken you will be contacted by phone or by letter within the next 1-3 weeks.  Please call us at [phone] if you have not heard about the biopsies in 3 weeks.    SIGNATURES/CONFIDENTIALITY: You and/or your care partner have signed paperwork which will be entered into your electronic medical record.  These signatures attest to the fact that that the information above on your After Visit Summary has been reviewed and is understood.  Full responsibility of the confidentiality of this discharge information lies with you and/or your care-partner.

## 2019-06-07 NOTE — Progress Notes (Signed)
Pt's states no medical or surgical changes since previsit or office visit.   TEMP-jb  V/s-cw  Made CRNA Aware that pt. Had sip of spirit at 0730.

## 2019-06-07 NOTE — Progress Notes (Signed)
pt tolerated well. VSS. awake and to recovery. Report given to RN.  

## 2019-06-11 ENCOUNTER — Telehealth: Payer: Self-pay

## 2019-06-11 NOTE — Telephone Encounter (Signed)
  Follow up Call-  Call back number 06/07/2019  Post procedure Call Back phone  # [phone]  Permission to leave phone message Yes  Some recent data might be hidden     Patient questions:  Do you have a fever, pain , or abdominal swelling? No. Pain Score  0 *  Have you tolerated food without any problems? Yes.    Have you been able to return to your normal activities? Yes.    Do you have any questions about your discharge instructions: Diet   No. Medications  No. Follow up visit  No.  Do you have questions or concerns about your Care? No.  Actions: * If pain score is 4 or above: 1. No action needed, pain <4.Have you developed a fever since your procedure? no  2.   Have you had an respiratory symptoms (SOB or cough) since your procedure? no  3.   Have you tested positive for COVID 19 since your procedure no  4.   Have you had any family members/close contacts diagnosed with the COVID 19 since your procedure?  no   If yes to any of these questions please route to Joylene John, RN and Erenest Rasher, RN

## 2019-06-13 DIAGNOSIS — I10 Essential (primary) hypertension: Secondary | ICD-10-CM | POA: Diagnosis not present

## 2019-06-13 DIAGNOSIS — G629 Polyneuropathy, unspecified: Secondary | ICD-10-CM | POA: Diagnosis not present

## 2019-06-13 DIAGNOSIS — K219 Gastro-esophageal reflux disease without esophagitis: Secondary | ICD-10-CM | POA: Diagnosis not present

## 2019-06-13 DIAGNOSIS — K5900 Constipation, unspecified: Secondary | ICD-10-CM | POA: Diagnosis not present

## 2019-06-13 DIAGNOSIS — E782 Mixed hyperlipidemia: Secondary | ICD-10-CM | POA: Diagnosis not present

## 2019-06-13 DIAGNOSIS — G4700 Insomnia, unspecified: Secondary | ICD-10-CM | POA: Diagnosis not present

## 2019-06-13 DIAGNOSIS — R7303 Prediabetes: Secondary | ICD-10-CM | POA: Diagnosis not present

## 2019-06-20 ENCOUNTER — Encounter: Payer: Self-pay | Admitting: Gastroenterology

## 2019-06-26 ENCOUNTER — Telehealth: Payer: Self-pay | Admitting: Gastroenterology

## 2019-06-26 NOTE — Telephone Encounter (Signed)
Left message on machine to call back     06/20/2019 MRN: [id number]   [facility] [address] [place] [address]  Dear Mr. Konda,  The polyps removed from your colon were benign, however, they were precancerous. This means that they had the potential to change into cancer over time had they not been removed.  I recommend you have a repeat colonoscopy in 1 year with a more extensive bowel prep to determine if you have developed any new polyps and to screen for colorectal cancer.   If you develop any new rectal bleeding, abdominal pain or significant bowel habit changes, please contact us before then at Dept: [phone].  Please call us if you have persistent problems or have questions about your condition that have not been fully answered at this time.  Sincerely,  Ladene Artist, MD       [facility]                       Luan Moore, [id number]                       1     Print Letter Notes  None

## 2019-06-26 NOTE — Telephone Encounter (Signed)
Patient calling for path results 

## 2019-06-28 NOTE — Telephone Encounter (Signed)
I reviewed the results with the patient and all questions answered.  I will mail him another copy.

## 2019-07-20 DIAGNOSIS — Z936 Other artificial openings of urinary tract status: Secondary | ICD-10-CM | POA: Diagnosis not present

## 2019-07-27 DIAGNOSIS — C679 Malignant neoplasm of bladder, unspecified: Secondary | ICD-10-CM | POA: Diagnosis not present

## 2019-08-03 DIAGNOSIS — C679 Malignant neoplasm of bladder, unspecified: Secondary | ICD-10-CM | POA: Diagnosis not present

## 2019-08-16 ENCOUNTER — Other Ambulatory Visit: Payer: Self-pay

## 2019-08-16 ENCOUNTER — Emergency Department (HOSPITAL_COMMUNITY)
Admission: EM | Admit: 2019-08-16 | Discharge: 2019-08-17 | Disposition: A | Discharge status: Home / Self Care | Payer: Medicare HMO | Attending: Emergency Medicine | Admitting: Emergency Medicine

## 2019-08-16 ENCOUNTER — Encounter (HOSPITAL_COMMUNITY): Payer: Self-pay | Admitting: Emergency Medicine

## 2019-08-16 DIAGNOSIS — E782 Mixed hyperlipidemia: Secondary | ICD-10-CM | POA: Insufficient documentation

## 2019-08-16 DIAGNOSIS — N390 Urinary tract infection, site not specified: Secondary | ICD-10-CM | POA: Diagnosis not present

## 2019-08-16 DIAGNOSIS — E1165 Type 2 diabetes mellitus with hyperglycemia: Secondary | ICD-10-CM | POA: Diagnosis not present

## 2019-08-16 DIAGNOSIS — Z7982 Long term (current) use of aspirin: Secondary | ICD-10-CM | POA: Diagnosis not present

## 2019-08-16 DIAGNOSIS — E039 Hypothyroidism, unspecified: Secondary | ICD-10-CM | POA: Insufficient documentation

## 2019-08-16 DIAGNOSIS — Z79899 Other long term (current) drug therapy: Secondary | ICD-10-CM | POA: Insufficient documentation

## 2019-08-16 DIAGNOSIS — R509 Fever, unspecified: Secondary | ICD-10-CM | POA: Diagnosis not present

## 2019-08-16 DIAGNOSIS — Z85038 Personal history of other malignant neoplasm of large intestine: Secondary | ICD-10-CM | POA: Diagnosis not present

## 2019-08-16 DIAGNOSIS — R410 Disorientation, unspecified: Secondary | ICD-10-CM | POA: Diagnosis not present

## 2019-08-16 DIAGNOSIS — I447 Left bundle-branch block, unspecified: Secondary | ICD-10-CM | POA: Diagnosis not present

## 2019-08-16 DIAGNOSIS — Z8551 Personal history of malignant neoplasm of bladder: Secondary | ICD-10-CM | POA: Diagnosis not present

## 2019-08-16 DIAGNOSIS — I10 Essential (primary) hypertension: Secondary | ICD-10-CM | POA: Diagnosis not present

## 2019-08-16 DIAGNOSIS — R531 Weakness: Secondary | ICD-10-CM | POA: Diagnosis not present

## 2019-08-16 DIAGNOSIS — R739 Hyperglycemia, unspecified: Secondary | ICD-10-CM

## 2019-08-16 LAB — URINALYSIS, ROUTINE W REFLEX MICROSCOPIC
Bilirubin Urine: NEGATIVE
Glucose, UA: NEGATIVE mg/dL
Hgb urine dipstick: NEGATIVE
Ketones, ur: NEGATIVE mg/dL
Nitrite: POSITIVE — AB
Protein, ur: NEGATIVE mg/dL
Specific Gravity, Urine: 1.008 (ref 1.005–1.030)
pH: 7 (ref 5.0–8.0)

## 2019-08-16 LAB — CBC
HCT: 35.2 % — ABNORMAL LOW (ref 39.0–52.0)
Hemoglobin: 11.6 g/dL — ABNORMAL LOW (ref 13.0–17.0)
MCH: 29.4 pg (ref 26.0–34.0)
MCHC: 33 g/dL (ref 30.0–36.0)
MCV: 89.1 fL (ref 80.0–100.0)
Platelets: 136 10*3/uL — ABNORMAL LOW (ref 150–400)
RBC: 3.95 MIL/uL — ABNORMAL LOW (ref 4.22–5.81)
RDW: 16 % — ABNORMAL HIGH (ref 11.5–15.5)
WBC: 7 10*3/uL (ref 4.0–10.5)
nRBC: 0 % (ref 0.0–0.2)

## 2019-08-16 LAB — BASIC METABOLIC PANEL
Anion gap: 14 (ref 5–15)
BUN: 23 mg/dL (ref 8–23)
CO2: 16 mmol/L — ABNORMAL LOW (ref 22–32)
Calcium: 9.3 mg/dL (ref 8.9–10.3)
Chloride: 110 mmol/L (ref 98–111)
Creatinine, Ser: 1.41 mg/dL — ABNORMAL HIGH (ref 0.61–1.24)
GFR calc Af Amer: 58 mL/min — ABNORMAL LOW (ref >60)
GFR calc non Af Amer: 50 mL/min — ABNORMAL LOW (ref >60)
Glucose, Bld: 252 mg/dL — ABNORMAL HIGH (ref 70–99)
Potassium: 4.2 mmol/L (ref 3.5–5.1)
Sodium: 140 mmol/L (ref 135–145)

## 2019-08-16 MED ORDER — SODIUM CHLORIDE 0.9 % IV SOLN
1.0000 g | Freq: Once | INTRAVENOUS | Status: AC
  Administered 2019-08-16: 1 g via INTRAVENOUS
  Filled 2019-08-16: qty 10

## 2019-08-16 MED ORDER — SODIUM CHLORIDE 0.9 % IV BOLUS (SEPSIS)
500.0000 mL | Freq: Once | INTRAVENOUS | Status: AC
  Administered 2019-08-16: 500 mL via INTRAVENOUS

## 2019-08-16 NOTE — ED Triage Notes (Addendum)
Patient comes from home by [facility] complaining of uti. Patient is confused since 1 pm. Patient has an ileostomy. Patient when he gets confused he has an uti. At 2115 had 1000 of tylenol.

## 2019-08-16 NOTE — ED Provider Notes (Signed)
[facility] DEPT Provider Note   CSN: [id number] Arrival date & time: 08/16/19  2140     History Chief Complaint  Patient presents with  . Recurrent UTI    Mark [name] is a 69 y.o. male.  Patient is a 69 year old gentleman with past history of bladder cancer, colon cancer with ileostomy bag in place, atrial fibrillation presenting to the emergency department for generalized weakness.  He says that this started yesterday.  Reports that in the past when he felt like this he was diagnosed with a urinary tract infection.  He denies any abdominal pain, chest pain, shortness of breath, fever, chills, numbness, tingling, weakness.  Also reports feeling slightly confused.  Has not tried anything for relief.  He does live at home alone. Reports normal output in ileostomy bag        Past Medical History:  Diagnosis Date  . Atrial fibrillation ([place])   . Bladder cancer ([place]) 2006  . Cataracts, both eyes   . Chronic kidney disease     BLADDER CANCER 2006  UROSTOMY   . 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 Erosive esophagitis   . GERD (gastroesophageal reflux disease)   . Hiatal hernia   . History of blood transfusion   . Hyperlipidemia   . Hypertension   . Internal hemorrhoids   . Neuropathy    feet  . PONV (postoperative nausea and vomiting)   . Tubular adenoma of colon 12/2009    Patient Active Problem List   Diagnosis Date Noted  . 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 Deviated nasal septum 10/10/2013  . Disorder of kidney and ureter 10/10/2013  . ED (erectile dysfunction) of organic origin 10/10/2013  . Hearing loss 10/10/2013  . Sensorineural hearing loss 10/10/2013  . Hematuria 10/10/2013  . Hypercholesteremia 10/10/2013  . Hypertrophy of nasal turbinates 10/10/2013  . Hypothyroidism 10/10/2013  . Intestinal infection due to other organism, not elsewhere classified 10/10/2013  . Lower urinary tract  infectious disease 10/10/2013  . Malignant neoplasm of urinary bladder ([place]) 10/10/2013  . Psychosexual dysfunction with inhibited sexual excitement 10/10/2013  . Testicular pain, right 10/10/2013  . Tinnitus 10/10/2013  . NECK PAIN, CHRONIC 11/14/2007  . OTHER DYSPHAGIA 11/14/2007  . GERD 11/10/2007  . ADENOCARCINOMA, COLON, HX OF 11/10/2007  . COLONIC POLYPS, HX OF 11/10/2007    Past Surgical History:  Procedure Laterality Date  . APPENDECTOMY    . BLADDER REMOVAL  2006   has urostomy//hx bladder ca  . CARDIAC CATHETERIZATION    . CHOLECYSTECTOMY     1999  . COLON SURGERY    . PROSTATECTOMY  2006  . SPINAL CORD STIMULATOR REMOVAL  12/29/2010   Procedure: CERVICAL SPINAL CORD STIMULATOR REMOVAL;  Surgeon: Peggyann Shoals, MD;  Location: [facility];  Service: Neurosurgery;  Laterality: N/A;  REMOVAL OF SPINAL CORD STIMULATOR, ELECTRODE AND IPG       Family History  Problem Relation Age of Onset  . Heart failure Mother   . Breast cancer Sister   . Colon cancer Neg Hx   . Stomach cancer Neg Hx   . Esophageal cancer Neg Hx   . Rectal cancer Neg Hx     Social History   Tobacco Use  . Smoking status: Never Smoker  . Smokeless tobacco: Never Used  Substance Use Topics  . Alcohol use: No  . Drug use: No    Home Medications Prior to Admission medications  Medication Sig Start Date End Date Taking? Authorizing Provider  aspirin EC 81 MG tablet Take 81 mg by mouth daily.    Yes [provider]  diltiazem (CARDIZEM CD) 240 MG 24 hr capsule Take 240 mg by mouth daily.  09/18/12  Yes [provider]  docusate sodium (COLACE) 100 MG capsule Take 100 mg by mouth at bedtime.   Yes [provider]  DULoxetine (CYMBALTA) 60 MG capsule Take 120 mg by mouth every morning.     Yes [provider]  KRILL OIL PO Take 1 capsule by mouth.    Yes [provider]  LORazepam (ATIVAN) 1 MG tablet Take 1 mg by mouth at bedtime.     Yes [provider]  Multiple Vitamins-Minerals (MULTIVITAMINS THER. W/MINERALS) TABS Take 1 tablet by mouth daily.     Yes [provider]  omeprazole (PRILOSEC) 20 MG capsule Take 2 capsules (40 mg total) by mouth 2 (two) times daily. 09/23/11  Yes Ladene Artist, MD  oxyCODONE-acetaminophen (PERCOCET) 10-325 MG tablet  04/06/19  Yes [provider]  pregabalin (LYRICA) 150 MG capsule Take 150 mg by mouth 2 (two) times daily.  05/28/19  Yes [provider]  rosuvastatin (CRESTOR) 10 MG tablet Take 10 mg by mouth daily.   Yes [provider]  temazepam (RESTORIL) 30 MG capsule Take 30 mg by mouth at bedtime as needed. For sleep.    Yes [provider]  polyethylene glycol powder (MIRALAX) powder Take 1 Container by mouth once.    [provider]    Allergies    Penicillins, Contrast media  [iodinated diagnostic agents], and Hydrocodone-acetaminophen  Review of Systems   Review of Systems  Constitutional: Negative for appetite change, chills and fever.  HENT: Negative.   Respiratory: Negative for cough and shortness of breath.   Cardiovascular: Negative for chest pain.  Gastrointestinal: Negative for abdominal pain, nausea and vomiting.  Genitourinary: Negative for dysuria, flank pain and urgency.  Musculoskeletal: Negative for back pain.  Skin: Negative for rash and wound.  Neurological: Positive for weakness. Negative for speech difficulty.    Physical Exam Updated Vital Signs BP (!) 157/83 (BP Location: Left Arm)   Pulse 79   Temp 99.1 F (37.3 C) (Oral)   Resp 16   SpO2 100%   Physical Exam Vitals and nursing note reviewed.  Constitutional:      General: He is not in acute distress.    Appearance: He is not ill-appearing, toxic-appearing or diaphoretic.     Comments: Appears globally weak  HENT:     Head: Normocephalic.     Mouth/Throat:     Mouth: Mucous membranes are moist.     Pharynx: Oropharynx is clear.  Eyes:      Conjunctiva/sclera: Conjunctivae normal.     Pupils: Pupils are equal, round, and reactive to light.  Cardiovascular:     Rate and Rhythm: Normal rate and regular rhythm.  Pulmonary:     Effort: Pulmonary effort is normal.  Abdominal:     General: There is distension.     Palpations: Abdomen is soft.     Tenderness: There is no abdominal tenderness. There is no guarding.  Musculoskeletal:     Right lower leg: No edema.     Left lower leg: No edema.  Skin:    General: Skin is warm and dry.  Neurological:     General: No focal deficit present.     Mental Status: He  is alert and oriented to person, place, and time.     Gait: Gait normal.  Psychiatric:        Mood and Affect: Mood normal.     ED Results / Procedures / Treatments   Labs (all labs ordered are listed, but only abnormal results are displayed) Labs Reviewed  URINALYSIS, ROUTINE W REFLEX MICROSCOPIC - Abnormal; Notable for the following components:      Result Value   APPearance HAZY (*)    Nitrite POSITIVE (*)    Leukocytes,Ua MODERATE (*)    Bacteria, UA MANY (*)    All other components within normal limits  BASIC METABOLIC PANEL - Abnormal; Notable for the following components:   CO2 16 (*)    Glucose, Bld 252 (*)    Creatinine, Ser 1.41 (*)    GFR calc non Af Amer 50 (*)    GFR calc Af Amer 58 (*)    All other components within normal limits  CBC - Abnormal; Notable for the following components:   RBC 3.95 (*)    Hemoglobin 11.6 (*)    HCT 35.2 (*)    RDW 16.0 (*)    Platelets 136 (*)    All other components within normal limits  CBG MONITORING, ED - Abnormal; Notable for the following components:   Glucose-Capillary 205 (*)    All other components within normal limits  URINE CULTURE    EKG None  Radiology No results found.  Procedures Procedures (including critical care time)  Medications Ordered in ED Medications  cefTRIAXone (ROCEPHIN) 1 g in sodium chloride 0.9 % 100 mL IVPB (0 g  Intravenous Stopped 08/17/19 0137)  sodium chloride 0.9 % bolus 500 mL (0 mLs Intravenous Stopped 08/17/19 0137)  sodium chloride 0.9 % bolus 500 mL (0 mLs Intravenous Stopped 08/17/19 6294)    ED Course  I have reviewed the triage vital signs and the nursing notes.  Pertinent labs & imaging results that were available during my care of the patient were reviewed by me and considered in my medical decision making (see chart for details).  Clinical Course as of Aug 17 603  Thu Aug 16, 2019  2333 Patient presenting with one day of generalized weakness. Hx of UTI with similar symptoms in the past. Appears overall weak and ill on exam but NAD.  He is afebrile. UA is consistent with UTI. His remaining labs are reassuring with normal WBC and kidney function at baseline. He is getting IV rocephin and IV fluids.  Of note, patient's glucose was 252.  He reportedly is taking a diabetes medication, unknown of the name. Attempted to call patient's son who called EMS today for his father but was unable to reach him   [KM]  Fri Aug 17, 2019  0558 I was able to speak with the patient's son Mark [name] about the patient. He reports that his father was more confused than normal and had a similar presentation with a UTI last year. Patient continues to rest comfortably. Afebrile and glucose improved. Patient lives by himself but son reports he is able to check on him often and is ok with him d/c home on PO antibiotics. Will also send note to PMD so patient can be followed closely for f/u relating to his UTI and diabetes.    [KM]    Clinical Course User Index [KM] Kristine Royal   MDM Rules/Calculators/A&P  Based on review of vitals, medical screening exam, lab work and/or imaging, there does not appear to be an acute, emergent etiology for the patient's symptoms. Counseled pt on good return precautions and encouraged both PCP and ED follow-up as needed.  Prior to discharge, I also  discussed incidental imaging findings with patient in detail and advised appropriate, recommended follow-up in detail.  Clinical Impression: No diagnosis found.  Disposition: Discharge  Prior to providing a prescription for a controlled substance, I independently reviewed the patient's recent prescription history on the [facility]. The patient had no recent or regular prescriptions and was deemed appropriate for a brief, less than 3 day prescription of narcotic for acute analgesia.  This note was prepared with assistance of Systems analyst. Occasional wrong-word or sound-a-like substitutions may have occurred due to the inherent limitations of voice recognition software.  Final Clinical Impression(s) / ED Diagnoses Final diagnoses:  None    Rx / DC Orders ED Discharge Orders    None       Kristine Royal 08/17/19 3818    Fatima Blank, MD 08/19/19 [phone]

## 2019-08-17 LAB — CBG MONITORING, ED: Glucose-Capillary: 205 mg/dL — ABNORMAL HIGH (ref 70–99)

## 2019-08-17 MED ORDER — SODIUM CHLORIDE 0.9 % IV BOLUS (SEPSIS)
500.0000 mL | Freq: Once | INTRAVENOUS | Status: AC
  Administered 2019-08-17: 500 mL via INTRAVENOUS

## 2019-08-17 MED ORDER — SULFAMETHOXAZOLE-TRIMETHOPRIM 800-160 MG PO TABS
1.0000 | ORAL_TABLET | Freq: Two times a day (BID) | ORAL | 0 refills | Status: AC
Start: 2019-08-17 — End: 2019-08-24

## 2019-08-17 NOTE — Discharge Instructions (Addendum)
You are seen today for generalized weakness.  It appears that you do have a urinary tract infection so we are starting you on antibiotics.  Also, your sugar was elevated.  Please make sure that you follow-up with your primary medical doctor next week for both of these things.  Please make sure you return to the emergency department if you have any new or worsening symptoms.

## 2019-08-17 NOTE — ED Notes (Signed)
Pt changed and ostomy bag replaced.

## 2019-08-17 NOTE — ED Notes (Signed)
pts assisted with bed bath, linens changed and colostomy bag changed

## 2019-08-17 NOTE — ED Notes (Signed)
Son en route to pick up patient. States he should arrive around 8a.

## 2019-08-18 LAB — URINE CULTURE: Special Requests: NORMAL

## 2019-08-19 NOTE — ED Provider Notes (Signed)
Attestation: Medical screening examination/treatment/procedure(s) were conducted as a shared visit with non-physician practitioner(s) and myself.  I personally evaluated the patient during the encounter.   Briefly, the patient is a 69 y.o. male with h/o bladder cancer, colon cancer with ileostomy bag in place, atrial fibrillation presenting to the emergency department for generalized weakness  Vitals:   08/17/19 0345 08/17/19 0554  BP:  (!) 157/83  Pulse: 78 79  Resp:  16  Temp:  99.1 F (37.3 C)  SpO2: 100% 100%    CONSTITUTIONAL:  nontoxic-appearing, NAD NEURO:  Alert and oriented x 3, no focal deficits EYES:  pupils equal and reactive ENT/NECK:  trachea midline, no JVD CARDIO:  reg rate, reg rhythm, well-perfused PULM:  None labored breathing GI/GU:  Abdomen non-distended, nontender. ileo conduit in RLQ MSK/SPINE:  No gross deformities, no edema SKIN:  no rash, atraumatic PSYCH:  Appropriate speech and behavior   EKG Interpretation  Date/Time:    Ventricular Rate:    PR Interval:    QRS Duration:   QT Interval:    QTC Calculation:   R Axis:     Text Interpretation:         Work up suspicious for UTI given change from prior UA. Abx given. The patient appears reasonably screened and/or stabilized for discharge and I doubt any other medical condition or other [facility] requiring further screening, evaluation, or treatment in the ED at this time prior to discharge. Safe for discharge with strict return precautions.      Fatima Blank, MD 08/19/19 [phone]

## 2019-09-04 DIAGNOSIS — G629 Polyneuropathy, unspecified: Secondary | ICD-10-CM | POA: Diagnosis not present

## 2019-09-04 DIAGNOSIS — N3090 Cystitis, unspecified without hematuria: Secondary | ICD-10-CM | POA: Diagnosis not present

## 2019-10-16 DIAGNOSIS — G4700 Insomnia, unspecified: Secondary | ICD-10-CM | POA: Diagnosis not present

## 2019-10-16 DIAGNOSIS — G629 Polyneuropathy, unspecified: Secondary | ICD-10-CM | POA: Diagnosis not present

## 2019-10-16 DIAGNOSIS — I10 Essential (primary) hypertension: Secondary | ICD-10-CM | POA: Diagnosis not present

## 2019-10-16 DIAGNOSIS — N183 Chronic kidney disease, stage 3 unspecified: Secondary | ICD-10-CM | POA: Diagnosis not present

## 2019-10-16 DIAGNOSIS — E0822 Diabetes mellitus due to underlying condition with diabetic chronic kidney disease: Secondary | ICD-10-CM | POA: Diagnosis not present

## 2019-10-16 DIAGNOSIS — E782 Mixed hyperlipidemia: Secondary | ICD-10-CM | POA: Diagnosis not present

## 2019-10-22 DIAGNOSIS — Z936 Other artificial openings of urinary tract status: Secondary | ICD-10-CM | POA: Diagnosis not present

## 2019-11-16 DIAGNOSIS — Z936 Other artificial openings of urinary tract status: Secondary | ICD-10-CM | POA: Diagnosis not present

## 2020-01-18 DIAGNOSIS — Z936 Other artificial openings of urinary tract status: Secondary | ICD-10-CM | POA: Diagnosis not present

## 2020-01-24 DIAGNOSIS — L570 Actinic keratosis: Secondary | ICD-10-CM | POA: Diagnosis not present

## 2020-01-24 DIAGNOSIS — L738 Other specified follicular disorders: Secondary | ICD-10-CM | POA: Diagnosis not present

## 2020-01-24 DIAGNOSIS — D485 Neoplasm of uncertain behavior of skin: Secondary | ICD-10-CM | POA: Diagnosis not present

## 2020-01-24 DIAGNOSIS — Z85828 Personal history of other malignant neoplasm of skin: Secondary | ICD-10-CM | POA: Diagnosis not present

## 2020-01-24 DIAGNOSIS — C44311 Basal cell carcinoma of skin of nose: Secondary | ICD-10-CM | POA: Diagnosis not present

## 2020-01-24 DIAGNOSIS — C44529 Squamous cell carcinoma of skin of other part of trunk: Secondary | ICD-10-CM | POA: Diagnosis not present

## 2020-01-24 DIAGNOSIS — C44329 Squamous cell carcinoma of skin of other parts of face: Secondary | ICD-10-CM | POA: Diagnosis not present

## 2020-02-14 DIAGNOSIS — Z936 Other artificial openings of urinary tract status: Secondary | ICD-10-CM | POA: Diagnosis not present

## 2020-02-16 HISTORY — PX: COLONOSCOPY: SHX174

## 2020-02-19 DIAGNOSIS — Z85828 Personal history of other malignant neoplasm of skin: Secondary | ICD-10-CM | POA: Diagnosis not present

## 2020-02-19 DIAGNOSIS — C44311 Basal cell carcinoma of skin of nose: Secondary | ICD-10-CM | POA: Diagnosis not present

## 2020-04-09 DIAGNOSIS — E782 Mixed hyperlipidemia: Secondary | ICD-10-CM | POA: Diagnosis not present

## 2020-04-09 DIAGNOSIS — I10 Essential (primary) hypertension: Secondary | ICD-10-CM | POA: Diagnosis not present

## 2020-04-09 DIAGNOSIS — E0822 Diabetes mellitus due to underlying condition with diabetic chronic kidney disease: Secondary | ICD-10-CM | POA: Diagnosis not present

## 2020-04-09 DIAGNOSIS — L732 Hidradenitis suppurativa: Secondary | ICD-10-CM | POA: Diagnosis not present

## 2020-04-09 DIAGNOSIS — N183 Chronic kidney disease, stage 3 unspecified: Secondary | ICD-10-CM | POA: Diagnosis not present

## 2020-04-09 DIAGNOSIS — G629 Polyneuropathy, unspecified: Secondary | ICD-10-CM | POA: Diagnosis not present

## 2020-04-10 DIAGNOSIS — Z936 Other artificial openings of urinary tract status: Secondary | ICD-10-CM | POA: Diagnosis not present

## 2020-05-09 DIAGNOSIS — M67912 Unspecified disorder of synovium and tendon, left shoulder: Secondary | ICD-10-CM | POA: Diagnosis not present

## 2020-05-15 DIAGNOSIS — Z936 Other artificial openings of urinary tract status: Secondary | ICD-10-CM | POA: Diagnosis not present

## 2020-06-26 DIAGNOSIS — Z936 Other artificial openings of urinary tract status: Secondary | ICD-10-CM | POA: Diagnosis not present

## 2020-07-05 DIAGNOSIS — A499 Bacterial infection, unspecified: Secondary | ICD-10-CM | POA: Diagnosis not present

## 2020-07-05 DIAGNOSIS — R300 Dysuria: Secondary | ICD-10-CM | POA: Diagnosis not present

## 2020-07-05 DIAGNOSIS — R82998 Other abnormal findings in urine: Secondary | ICD-10-CM | POA: Diagnosis not present

## 2020-07-05 DIAGNOSIS — N390 Urinary tract infection, site not specified: Secondary | ICD-10-CM | POA: Diagnosis not present

## 2020-07-16 ENCOUNTER — Other Ambulatory Visit: Payer: Self-pay

## 2020-07-16 ENCOUNTER — Ambulatory Visit (AMBULATORY_SURGERY_CENTER): Payer: Self-pay

## 2020-07-16 VITALS — Ht 67.0 in | Wt 179.0 lb

## 2020-07-16 DIAGNOSIS — Z85038 Personal history of other malignant neoplasm of large intestine: Secondary | ICD-10-CM

## 2020-07-16 DIAGNOSIS — Z86010 Personal history of colonic polyps: Secondary | ICD-10-CM

## 2020-07-16 MED ORDER — NA SULFATE-K SULFATE-MG SULF 17.5-3.13-1.6 GM/177ML PO SOLN: 1.0000 | ORAL | 0 refills | Status: DC

## 2020-07-16 NOTE — Progress Notes (Signed)
Patient is here in-person for PV. Patient denies any allergies to eggs or soy. Patient denies any problems with anesthesia/sedation. Patient denies any oxygen use at home. Patient denies taking any diet/weight loss medications or blood thinners. Patient is not being treated for MRSA or C-diff. Patient is aware of our care-partner policy and [id number] safety protocol.  Patient is COVID-19 vaccinated, per patient.

## 2020-07-29 ENCOUNTER — Encounter: Payer: Self-pay | Admitting: Certified Registered Nurse Anesthetist

## 2020-07-30 ENCOUNTER — Encounter: Payer: Self-pay | Admitting: Gastroenterology

## 2020-07-30 ENCOUNTER — Ambulatory Visit (AMBULATORY_SURGERY_CENTER): Payer: Medicare HMO | Admitting: Gastroenterology

## 2020-07-30 ENCOUNTER — Other Ambulatory Visit: Payer: Self-pay

## 2020-07-30 VITALS — BP 120/56 | HR 67 | Temp 98.7°F | Resp 17

## 2020-07-30 DIAGNOSIS — Z86010 Personal history of colonic polyps: Secondary | ICD-10-CM | POA: Diagnosis not present

## 2020-07-30 DIAGNOSIS — D123 Benign neoplasm of transverse colon: Secondary | ICD-10-CM | POA: Diagnosis not present

## 2020-07-30 DIAGNOSIS — Z85038 Personal history of other malignant neoplasm of large intestine: Secondary | ICD-10-CM

## 2020-07-30 DIAGNOSIS — D120 Benign neoplasm of cecum: Secondary | ICD-10-CM

## 2020-07-30 DIAGNOSIS — I10 Essential (primary) hypertension: Secondary | ICD-10-CM | POA: Diagnosis not present

## 2020-07-30 DIAGNOSIS — D124 Benign neoplasm of descending colon: Secondary | ICD-10-CM | POA: Diagnosis not present

## 2020-07-30 DIAGNOSIS — I4891 Unspecified atrial fibrillation: Secondary | ICD-10-CM | POA: Diagnosis not present

## 2020-07-30 DIAGNOSIS — D128 Benign neoplasm of rectum: Secondary | ICD-10-CM | POA: Diagnosis not present

## 2020-07-30 MED ORDER — SODIUM CHLORIDE 0.9 % IV SOLN: 500.0000 mL | Freq: Once | INTRAVENOUS | Status: DC

## 2020-07-30 NOTE — Progress Notes (Signed)
Vs by CW in adm  Pt's states no medical or surgical changes since previsit or office visit.     

## 2020-07-30 NOTE — Patient Instructions (Signed)
Await pathology results   YOU HAD AN ENDOSCOPIC PROCEDURE TODAY AT THE [facility]:   Refer to the procedure report that was given to you for any specific questions about what was found during the examination.  If the procedure report does not answer your questions, please call your gastroenterologist to clarify.  If you requested that your care partner not be given the details of your procedure findings, then the procedure report has been included in a sealed envelope for you to review at your convenience later.  YOU SHOULD EXPECT: Some feelings of bloating in the abdomen. Passage of more gas than usual.  Walking can help get rid of the air that was put into your GI tract during the procedure and reduce the bloating. If you had a lower endoscopy (such as a colonoscopy or flexible sigmoidoscopy) you may notice spotting of blood in your stool or on the toilet paper. If you underwent a bowel prep for your procedure, you may not have a normal bowel movement for a few days.  Please Note:  You might notice some irritation and congestion in your nose or some drainage.  This is from the oxygen used during your procedure.  There is no need for concern and it should clear up in a day or so.  SYMPTOMS TO REPORT IMMEDIATELY:   Following lower endoscopy (colonoscopy or flexible sigmoidoscopy):  Excessive amounts of blood in the stool  Significant tenderness or worsening of abdominal pains  Swelling of the abdomen that is new, acute  Fever of 100F or higher  For urgent or emergent issues, a gastroenterologist can be reached at any hour by calling [phone]. Do not use MyChart messaging for urgent concerns.    DIET:  We do recommend a small meal at first, but then you may proceed to your regular diet.  Drink plenty of fluids but you should avoid alcoholic beverages for 24 hours.  ACTIVITY:  You should plan to take it easy for the rest of today and you should NOT DRIVE or use heavy  machinery until tomorrow (because of the sedation medicines used during the test).    FOLLOW UP: Our staff will call the number listed on your records 48-72 hours following your procedure to check on you and address any questions or concerns that you may have regarding the information given to you following your procedure. If we do not reach you, we will leave a message.  We will attempt to reach you two times.  During this call, we will ask if you have developed any symptoms of COVID 19. If you develop any symptoms (ie: fever, flu-like symptoms, shortness of breath, cough etc.) before then, please call [phone].  If you test positive for Covid 19 in the 2 weeks post procedure, please call and report this information to us.    If any biopsies were taken you will be contacted by phone or by letter within the next 1-3 weeks.  Please call us at [phone] if you have not heard about the biopsies in 3 weeks.    SIGNATURES/CONFIDENTIALITY: You and/or your care partner have signed paperwork which will be entered into your electronic medical record.  These signatures attest to the fact that that the information above on your After Visit Summary has been reviewed and is understood.  Full responsibility of the confidentiality of this discharge information lies with you and/or your care-partner. 

## 2020-07-30 NOTE — Op Note (Signed)
[place] Patient Name: Mark [name] Procedure Date: 07/30/2020 8:31 AM MRN: [id number] Endoscopist: Ladene Artist , MD Age: 70 Referring MD:  Date of Birth: 1951-01-23 Gender: Male Account #: [id number] Procedure:                Colonoscopy Indications:              High risk colon cancer surveillance: Personal                            history of colon cancer. Personal history of                            multiple adenomatous colon polyps. Medicines:                Monitored Anesthesia Care Procedure:                Pre-Anesthesia Assessment:                           - Prior to the procedure, a History and Physical                            was performed, and patient medications and                            allergies were reviewed. The patient's tolerance of                            previous anesthesia was also reviewed. The risks                            and benefits of the procedure and the sedation                            options and risks were discussed with the patient.                            All questions were answered, and informed consent                            was obtained. Prior Anticoagulants: The patient has                            taken no previous anticoagulant or antiplatelet                            agents. ASA Grade Assessment: II - A patient with                            mild systemic disease. After reviewing the risks                            and benefits, the patient was deemed in  satisfactory condition to undergo the procedure.                           After obtaining informed consent, the colonoscope                            was passed under direct vision. Throughout the                            procedure, the patient's blood pressure, pulse, and                            oxygen saturations were monitored continuously. The                            Olympus CF-HQ190 [id number])  Colonoscope was                            introduced through the anus and advanced to the the                            cecum, identified by appendiceal orifice and                            ileocecal valve. The ileocecal valve, appendiceal                            orifice, and rectum were photographed. The quality                            of the bowel preparation was good after extensive                            lavage and suction. The colonoscopy was performed                            without difficulty. The patient tolerated the                            procedure well. Scope In: 8:41:20 AM Scope Out: 9:00:16 AM Scope Withdrawal Time: 0 hours 16 minutes 24 seconds  Total Procedure Duration: 0 hours 18 minutes 56 seconds  Findings:                 The perianal and digital rectal examinations were                            normal.                           Six sessile polyps were found in the rectum (1),                            descending colon (1), transverse colon (3) and  cecum (1). The polyps were 6 to 9 mm in size. These                            polyps were removed with a cold snare. Resection                            and retrieval were complete.                           Two sessile polyps were found in the transverse                            colon and distal transverse colon. The polyps were                            4 to 5 mm in size. These polyps were removed with a                            cold biopsy forceps. Resection and retrieval were                            complete.                           There was evidence of a prior end-to-end                            colo-colonic anastomosis in the sigmoid colon. This                            was patent and was characterized by healthy                            appearing mucosa. The anastomosis was traversed.                           The exam was otherwise without  abnormality on                            direct and retroflexion views. Complications:            No immediate complications. Estimated blood loss:                            None. Estimated Blood Loss:     Estimated blood loss: none. Impression:               - Six 6 to 9 mm polyps in the rectum, in the                            descending colon, in the transverse colon and in                            the cecum, removed with a cold snare.  Resected and                            retrieved.                           - Two 4 to 5 mm polyps in the transverse colon and                            in the distal transverse colon, removed with a cold                            biopsy forceps. Resected and retrieved.                           - Patent end-to-end colo-colonic anastomosis,                            characterized by healthy appearing mucosa.                           - The examination was otherwise normal on direct                            and retroflexion views. Recommendation:           - Repeat colonoscopy, likely in 2 years, after                            studies are complete for surveillance based on                            pathology results with an extended bowel prep.                           - Patient has a contact number available for                            emergencies. The signs and symptoms of potential                            delayed complications were discussed with the                            patient. Return to normal activities tomorrow.                            Written discharge instructions were provided to the                            patient.                           - High fiber diet.                           -  Continue present medications.                           - Await pathology results. Ladene Artist, MD 07/30/2020 9:07:23 AM This report has been signed electronically.

## 2020-07-30 NOTE — Progress Notes (Signed)
Report given to PACU, vss 

## 2020-07-30 NOTE — Progress Notes (Signed)
Called to room to assist during endoscopic procedure.  Patient ID and intended procedure confirmed with present staff. Received instructions for my participation in the procedure from the performing physician.  

## 2020-07-30 NOTE — Progress Notes (Deleted)
Pt's states no medical or surgical changes since previsit or office visit.   VS taken by CW 

## 2020-08-01 ENCOUNTER — Telehealth: Payer: Self-pay | Admitting: *Deleted

## 2020-08-01 NOTE — Telephone Encounter (Signed)
Message left

## 2020-08-01 NOTE — Telephone Encounter (Signed)
Second follow up call made.

## 2020-08-04 DIAGNOSIS — C679 Malignant neoplasm of bladder, unspecified: Secondary | ICD-10-CM | POA: Diagnosis not present

## 2020-08-05 ENCOUNTER — Encounter: Payer: Self-pay | Admitting: Gastroenterology

## 2020-08-11 DIAGNOSIS — I8500 Esophageal varices without bleeding: Secondary | ICD-10-CM | POA: Diagnosis not present

## 2020-08-11 DIAGNOSIS — C672 Malignant neoplasm of lateral wall of bladder: Secondary | ICD-10-CM | POA: Diagnosis not present

## 2020-08-11 DIAGNOSIS — Z85038 Personal history of other malignant neoplasm of large intestine: Secondary | ICD-10-CM | POA: Diagnosis not present

## 2020-08-11 DIAGNOSIS — N5232 Erectile dysfunction following radical cystectomy: Secondary | ICD-10-CM | POA: Diagnosis not present

## 2020-08-11 DIAGNOSIS — C679 Malignant neoplasm of bladder, unspecified: Secondary | ICD-10-CM | POA: Diagnosis not present

## 2020-08-11 DIAGNOSIS — K7689 Other specified diseases of liver: Secondary | ICD-10-CM | POA: Diagnosis not present

## 2020-08-14 DIAGNOSIS — Z936 Other artificial openings of urinary tract status: Secondary | ICD-10-CM | POA: Diagnosis not present

## 2020-08-27 DIAGNOSIS — L723 Sebaceous cyst: Secondary | ICD-10-CM | POA: Diagnosis not present

## 2020-08-27 DIAGNOSIS — Z85828 Personal history of other malignant neoplasm of skin: Secondary | ICD-10-CM | POA: Diagnosis not present

## 2020-08-27 DIAGNOSIS — L820 Inflamed seborrheic keratosis: Secondary | ICD-10-CM | POA: Diagnosis not present

## 2020-08-27 DIAGNOSIS — L570 Actinic keratosis: Secondary | ICD-10-CM | POA: Diagnosis not present

## 2020-09-15 DIAGNOSIS — H5203 Hypermetropia, bilateral: Secondary | ICD-10-CM | POA: Diagnosis not present

## 2020-09-17 DIAGNOSIS — Z936 Other artificial openings of urinary tract status: Secondary | ICD-10-CM | POA: Diagnosis not present

## 2020-10-04 DIAGNOSIS — N390 Urinary tract infection, site not specified: Secondary | ICD-10-CM | POA: Diagnosis not present

## 2020-10-04 DIAGNOSIS — R300 Dysuria: Secondary | ICD-10-CM | POA: Diagnosis not present

## 2020-10-04 DIAGNOSIS — A499 Bacterial infection, unspecified: Secondary | ICD-10-CM | POA: Diagnosis not present

## 2020-10-21 DIAGNOSIS — E782 Mixed hyperlipidemia: Secondary | ICD-10-CM | POA: Diagnosis not present

## 2020-10-21 DIAGNOSIS — Z0000 Encounter for general adult medical examination without abnormal findings: Secondary | ICD-10-CM | POA: Diagnosis not present

## 2020-10-21 DIAGNOSIS — E0822 Diabetes mellitus due to underlying condition with diabetic chronic kidney disease: Secondary | ICD-10-CM | POA: Diagnosis not present

## 2020-10-21 DIAGNOSIS — I10 Essential (primary) hypertension: Secondary | ICD-10-CM | POA: Diagnosis not present

## 2020-10-21 DIAGNOSIS — G4700 Insomnia, unspecified: Secondary | ICD-10-CM | POA: Diagnosis not present

## 2020-10-21 DIAGNOSIS — G629 Polyneuropathy, unspecified: Secondary | ICD-10-CM | POA: Diagnosis not present

## 2020-10-21 DIAGNOSIS — K5900 Constipation, unspecified: Secondary | ICD-10-CM | POA: Diagnosis not present

## 2020-10-21 DIAGNOSIS — Z23 Encounter for immunization: Secondary | ICD-10-CM | POA: Diagnosis not present

## 2020-11-07 DIAGNOSIS — Z936 Other artificial openings of urinary tract status: Secondary | ICD-10-CM | POA: Diagnosis not present

## 2021-01-09 DIAGNOSIS — Z936 Other artificial openings of urinary tract status: Secondary | ICD-10-CM | POA: Diagnosis not present

## 2021-01-09 DIAGNOSIS — R300 Dysuria: Secondary | ICD-10-CM | POA: Diagnosis not present

## 2021-03-02 DIAGNOSIS — Z85828 Personal history of other malignant neoplasm of skin: Secondary | ICD-10-CM | POA: Diagnosis not present

## 2021-03-02 DIAGNOSIS — L570 Actinic keratosis: Secondary | ICD-10-CM | POA: Diagnosis not present

## 2021-03-02 DIAGNOSIS — L905 Scar conditions and fibrosis of skin: Secondary | ICD-10-CM | POA: Diagnosis not present

## 2021-03-02 DIAGNOSIS — C44629 Squamous cell carcinoma of skin of left upper limb, including shoulder: Secondary | ICD-10-CM | POA: Diagnosis not present

## 2021-03-02 DIAGNOSIS — C44729 Squamous cell carcinoma of skin of left lower limb, including hip: Secondary | ICD-10-CM | POA: Diagnosis not present

## 2021-03-02 DIAGNOSIS — L821 Other seborrheic keratosis: Secondary | ICD-10-CM | POA: Diagnosis not present

## 2021-03-02 DIAGNOSIS — D485 Neoplasm of uncertain behavior of skin: Secondary | ICD-10-CM | POA: Diagnosis not present

## 2021-04-13 DIAGNOSIS — R300 Dysuria: Secondary | ICD-10-CM | POA: Diagnosis not present

## 2021-04-13 DIAGNOSIS — Z6830 Body mass index (BMI) 30.0-30.9, adult: Secondary | ICD-10-CM | POA: Diagnosis not present

## 2021-04-13 DIAGNOSIS — N390 Urinary tract infection, site not specified: Secondary | ICD-10-CM | POA: Diagnosis not present

## 2021-04-20 DIAGNOSIS — G4700 Insomnia, unspecified: Secondary | ICD-10-CM | POA: Diagnosis not present

## 2021-04-20 DIAGNOSIS — N1832 Chronic kidney disease, stage 3b: Secondary | ICD-10-CM | POA: Diagnosis not present

## 2021-04-20 DIAGNOSIS — E782 Mixed hyperlipidemia: Secondary | ICD-10-CM | POA: Diagnosis not present

## 2021-04-20 DIAGNOSIS — G629 Polyneuropathy, unspecified: Secondary | ICD-10-CM | POA: Diagnosis not present

## 2021-04-20 DIAGNOSIS — N390 Urinary tract infection, site not specified: Secondary | ICD-10-CM | POA: Diagnosis not present

## 2021-04-20 DIAGNOSIS — Z23 Encounter for immunization: Secondary | ICD-10-CM | POA: Diagnosis not present

## 2021-04-20 DIAGNOSIS — K5900 Constipation, unspecified: Secondary | ICD-10-CM | POA: Diagnosis not present

## 2021-04-20 DIAGNOSIS — I10 Essential (primary) hypertension: Secondary | ICD-10-CM | POA: Diagnosis not present

## 2021-04-20 DIAGNOSIS — E0822 Diabetes mellitus due to underlying condition with diabetic chronic kidney disease: Secondary | ICD-10-CM | POA: Diagnosis not present

## 2021-08-26 DIAGNOSIS — L570 Actinic keratosis: Secondary | ICD-10-CM | POA: Diagnosis not present

## 2021-08-26 DIAGNOSIS — C4441 Basal cell carcinoma of skin of scalp and neck: Secondary | ICD-10-CM | POA: Diagnosis not present

## 2021-08-26 DIAGNOSIS — L821 Other seborrheic keratosis: Secondary | ICD-10-CM | POA: Diagnosis not present

## 2021-08-26 DIAGNOSIS — D485 Neoplasm of uncertain behavior of skin: Secondary | ICD-10-CM | POA: Diagnosis not present

## 2021-08-26 DIAGNOSIS — C44519 Basal cell carcinoma of skin of other part of trunk: Secondary | ICD-10-CM | POA: Diagnosis not present

## 2021-08-26 DIAGNOSIS — Z85828 Personal history of other malignant neoplasm of skin: Secondary | ICD-10-CM | POA: Diagnosis not present

## 2021-08-26 DIAGNOSIS — L820 Inflamed seborrheic keratosis: Secondary | ICD-10-CM | POA: Diagnosis not present

## 2021-10-15 DIAGNOSIS — Z936 Other artificial openings of urinary tract status: Secondary | ICD-10-CM | POA: Diagnosis not present

## 2021-10-28 DIAGNOSIS — Z936 Other artificial openings of urinary tract status: Secondary | ICD-10-CM | POA: Diagnosis not present

## 2021-10-29 DIAGNOSIS — N183 Chronic kidney disease, stage 3 unspecified: Secondary | ICD-10-CM | POA: Diagnosis not present

## 2021-10-29 DIAGNOSIS — N390 Urinary tract infection, site not specified: Secondary | ICD-10-CM | POA: Diagnosis not present

## 2021-10-29 DIAGNOSIS — Z23 Encounter for immunization: Secondary | ICD-10-CM | POA: Diagnosis not present

## 2021-10-29 DIAGNOSIS — G629 Polyneuropathy, unspecified: Secondary | ICD-10-CM | POA: Diagnosis not present

## 2021-10-29 DIAGNOSIS — Z936 Other artificial openings of urinary tract status: Secondary | ICD-10-CM | POA: Diagnosis not present

## 2021-10-29 DIAGNOSIS — G4700 Insomnia, unspecified: Secondary | ICD-10-CM | POA: Diagnosis not present

## 2021-10-29 DIAGNOSIS — I10 Essential (primary) hypertension: Secondary | ICD-10-CM | POA: Diagnosis not present

## 2021-10-29 DIAGNOSIS — E0822 Diabetes mellitus due to underlying condition with diabetic chronic kidney disease: Secondary | ICD-10-CM | POA: Diagnosis not present

## 2021-10-29 DIAGNOSIS — K5900 Constipation, unspecified: Secondary | ICD-10-CM | POA: Diagnosis not present

## 2021-10-29 DIAGNOSIS — Z0000 Encounter for general adult medical examination without abnormal findings: Secondary | ICD-10-CM | POA: Diagnosis not present

## 2021-10-29 DIAGNOSIS — E782 Mixed hyperlipidemia: Secondary | ICD-10-CM | POA: Diagnosis not present

## 2021-12-25 ENCOUNTER — Emergency Department (HOSPITAL_BASED_OUTPATIENT_CLINIC_OR_DEPARTMENT_OTHER)
Admission: EM | Admit: 2021-12-25 | Discharge: 2021-12-25 | Disposition: A | Discharge status: Home / Self Care | Payer: Medicare HMO | Attending: Emergency Medicine | Admitting: Emergency Medicine

## 2021-12-25 ENCOUNTER — Encounter (HOSPITAL_BASED_OUTPATIENT_CLINIC_OR_DEPARTMENT_OTHER): Payer: Self-pay | Admitting: Emergency Medicine

## 2021-12-25 ENCOUNTER — Other Ambulatory Visit: Payer: Self-pay

## 2021-12-25 DIAGNOSIS — Z7982 Long term (current) use of aspirin: Secondary | ICD-10-CM | POA: Diagnosis not present

## 2021-12-25 DIAGNOSIS — U071 COVID-19: Secondary | ICD-10-CM

## 2021-12-25 DIAGNOSIS — I129 Hypertensive chronic kidney disease with stage 1 through stage 4 chronic kidney disease, or unspecified chronic kidney disease: Secondary | ICD-10-CM | POA: Diagnosis not present

## 2021-12-25 DIAGNOSIS — Z79899 Other long term (current) drug therapy: Secondary | ICD-10-CM | POA: Diagnosis not present

## 2021-12-25 DIAGNOSIS — N189 Chronic kidney disease, unspecified: Secondary | ICD-10-CM | POA: Insufficient documentation

## 2021-12-25 DIAGNOSIS — J029 Acute pharyngitis, unspecified: Secondary | ICD-10-CM | POA: Diagnosis present

## 2021-12-25 LAB — RESP PANEL BY RT-PCR (FLU A&B, COVID) ARPGX2
Influenza A by PCR: NEGATIVE
Influenza B by PCR: NEGATIVE
SARS Coronavirus 2 by RT PCR: POSITIVE — AB

## 2021-12-25 LAB — GROUP A STREP BY PCR: Group A Strep by PCR: NOT DETECTED

## 2021-12-25 MED ORDER — MOLNUPIRAVIR 200 MG PO CAPS: 4.0000 | ORAL_CAPSULE | Freq: Two times a day (BID) | ORAL | 0 refills | Status: AC

## 2021-12-25 NOTE — ED Triage Notes (Signed)
Sore throat, ear pain, cough, runny nose  Uri symptoms started yesterday

## 2021-12-25 NOTE — Discharge Instructions (Addendum)
You were seen today for upper respiratory symptoms.  Your testing revealed that you are positive for COVID-19.  Please take the prescribed antiviral therapy.  You may also take over-the-counter medication as needed for fever and symptom control.  Follow-up as needed with your primary care provider.  If you develop any life-threatening conditions please return to the emergency department

## 2021-12-25 NOTE — ED Provider Notes (Signed)
[facility] EMERGENCY DEPT Provider Note   CSN: [id number] Arrival date & time: 12/25/21  1833     History  Chief Complaint  Patient presents with   Sore Throat    Mark [name] is a 71 y.o. male.  Patient presents to the emergency room complaint of 2 days of sore throat, ear pain, cough, and runny nose.  Patient denies shortness of breath, chest pain, abdominal pain, nausea, vomiting.  Past medical history significant for GERD, hypertension, chronic kidney disease  HPI     Home Medications Prior to Admission medications   Medication Sig Start Date End Date Taking? Authorizing Provider  molnupiravir EUA (LAGEVRIO) 200 MG CAPS capsule Take 4 capsules (800 mg total) by mouth 2 (two) times daily for 5 days. 12/25/21 12/30/21 Yes Dorothyann Peng, PA-C  aspirin EC 81 MG tablet Take 81 mg by mouth daily.    [provider]  diltiazem (CARDIZEM CD) 240 MG 24 hr capsule Take 240 mg by mouth daily.  09/18/12   [provider]  KRILL OIL PO Take 1 capsule by mouth.     [provider]  Multiple Vitamins-Minerals (MULTIVITAMINS THER. W/MINERALS) TABS Take 1 tablet by mouth daily.    [provider]  omeprazole (PRILOSEC) 20 MG capsule Take 2 capsules (40 mg total) by mouth 2 (two) times daily. 09/23/11   Ladene Artist, MD  Oxycodone HCl 10 MG TABS  05/29/20   [provider]  temazepam (RESTORIL) 30 MG capsule Take 30 mg by mouth at bedtime as needed. For sleep.    [provider]      Allergies    Penicillins, Atorvastatin, and Contrast media  [iodinated contrast media]    Review of Systems   Review of Systems  HENT:  Positive for ear pain, rhinorrhea and sore throat.   Respiratory:  Positive for cough.     Physical Exam Updated Vital Signs BP 130/67 (BP Location: Right Arm)   Pulse 79   Temp 98.3 F (36.8 C) (Oral)   Resp 18   SpO2 96%  Physical Exam HENT:     Head: Normocephalic and atraumatic.      Nose: Rhinorrhea present.     Mouth/Throat:     Pharynx: Oropharynx is clear. Posterior oropharyngeal erythema present.  Eyes:     Conjunctiva/sclera: Conjunctivae normal.  Pulmonary:     Effort: Pulmonary effort is normal. No respiratory distress.  Musculoskeletal:        General: No signs of injury.     Cervical back: Normal range of motion.  Skin:    General: Skin is dry.  Neurological:     Mental Status: He is alert.  Psychiatric:        Speech: Speech normal.        Behavior: Behavior normal.     ED Results / Procedures / Treatments   Labs (all labs ordered are listed, but only abnormal results are displayed) Labs Reviewed  RESP PANEL BY RT-PCR (FLU A&B, COVID) ARPGX2 - Abnormal; Notable for the following components:      Result Value   SARS Coronavirus 2 by RT PCR POSITIVE (*)    All other components within normal limits  GROUP A STREP BY PCR    EKG None  Radiology No results found.  Procedures Procedures    Medications Ordered in ED Medications - No data to display  ED Course/ Medical Decision Making/ A&P  Medical Decision Making  Patient presents with upper respiratory symptoms.  Differential diagnosis includes but is not limited to COVID-19, influenza, viral illness, pneumonia, and others  I ordered and reviewed labs.  Patient positive for COVID-19  The patient's lungs were clear to auscultation and there is no indication for chest x-ray at this time  The patient is COVID-19.  Plan to discharge patient home with molnupiravir for antiviral therapy.  Patient may also use over-the-counter medications as needed for symptom control.  Return precautions including shortness of breath provided        Final Clinical Impression(s) / ED Diagnoses Final diagnoses:  COVID-19    Rx / DC Orders ED Discharge Orders          Ordered    molnupiravir EUA (LAGEVRIO) 200 MG CAPS capsule  2 times daily        12/25/21 2154               Ronny Bacon 12/25/21 2155    Jeanell Sparrow, DO 12/30/21 [phone]

## 2022-01-14 DIAGNOSIS — Z936 Other artificial openings of urinary tract status: Secondary | ICD-10-CM | POA: Diagnosis not present

## 2022-03-01 DIAGNOSIS — C44329 Squamous cell carcinoma of skin of other parts of face: Secondary | ICD-10-CM | POA: Diagnosis not present

## 2022-03-01 DIAGNOSIS — L720 Epidermal cyst: Secondary | ICD-10-CM | POA: Diagnosis not present

## 2022-03-01 DIAGNOSIS — L905 Scar conditions and fibrosis of skin: Secondary | ICD-10-CM | POA: Diagnosis not present

## 2022-03-01 DIAGNOSIS — Z85828 Personal history of other malignant neoplasm of skin: Secondary | ICD-10-CM | POA: Diagnosis not present

## 2022-03-01 DIAGNOSIS — L821 Other seborrheic keratosis: Secondary | ICD-10-CM | POA: Diagnosis not present

## 2022-03-01 DIAGNOSIS — L570 Actinic keratosis: Secondary | ICD-10-CM | POA: Diagnosis not present

## 2022-03-01 DIAGNOSIS — D485 Neoplasm of uncertain behavior of skin: Secondary | ICD-10-CM | POA: Diagnosis not present

## 2022-03-01 DIAGNOSIS — L820 Inflamed seborrheic keratosis: Secondary | ICD-10-CM | POA: Diagnosis not present

## 2022-03-03 ENCOUNTER — Encounter: Payer: Self-pay | Admitting: Gastroenterology

## 2022-03-03 ENCOUNTER — Ambulatory Visit: Payer: Medicare HMO | Admitting: Gastroenterology

## 2022-03-03 VITALS — BP 126/70 | HR 77 | Ht 66.0 in | Wt 171.0 lb

## 2022-03-03 DIAGNOSIS — K219 Gastro-esophageal reflux disease without esophagitis: Secondary | ICD-10-CM | POA: Diagnosis not present

## 2022-03-03 DIAGNOSIS — Z85038 Personal history of other malignant neoplasm of large intestine: Secondary | ICD-10-CM

## 2022-03-03 DIAGNOSIS — Z86010 Personal history of colonic polyps: Secondary | ICD-10-CM

## 2022-03-03 DIAGNOSIS — K5900 Constipation, unspecified: Secondary | ICD-10-CM | POA: Diagnosis not present

## 2022-03-03 NOTE — Patient Instructions (Signed)
Start over the counter Miralax 1-3 scoops daily.   Take your omeprazole every day.   The [facility] providers would like to encourage you to use [facility] to communicate with providers for non-urgent requests or questions.  Due to long hold times on the telephone, sending your provider a message by [facility] may be a faster and more efficient way to get a response.  Please allow 48 business hours for a response.  Please remember that this is for non-urgent requests.   Thank you for choosing me and [facility].  Pricilla Riffle. Dagoberto Ligas., MD., Marval Regal

## 2022-03-03 NOTE — Progress Notes (Signed)
    Assessment     Severe constipation Personal history of colon cancer in 2001 and personal history of multiple adenomatous colon polyps GERD   Recommendations    Increase Miralax to 1-3 caps daily, not prn. Continue Linzess 290 mcg qd. Colonoscopy recall in June 2024 with a 2 day prep Resume omeprazole 20 mg po bid, not prn. Follow antireflux measures.    HPI    This is a 72 year old male with constipation, abdominal bloating, frequent reflux symptoms.  He states despite remaining on Linzess daily and taking MiraLAX as needed his constipation has worsened.  He has a bowel movement about every 3 days.  He notes left-sided abdominal discomfort and bloating.  His reflux symptoms are active 2-3 times per week and he is taking Prilosec as needed.   Labs / Imaging       Latest Ref Rng & Units 08/03/2010    8:46 AM 12/17/2009    8:15 AM 12/17/2008    8:19 AM  Hepatic Function  Total Protein 6.0 - 8.3 g/dL 7.5  8.2  8.4   Albumin 3.5 - 5.2 g/dL 4.3  3.7  4.1   AST 0 - 37 U/L '23  31  30   '$ ALT 0 - 53 U/L '20  26  29   '$ Alk Phosphatase 39 - 117 U/L 82  65  117   Total Bilirubin 0.3 - 1.2 mg/dL 0.4  1.0  0.7        Latest Ref Rng & Units 08/16/2019   11:00 PM 12/23/2010    2:44 PM 08/03/2010    8:46 AM  CBC  WBC 4.0 - 10.5 K/uL 7.0  6.4  4.9   Hemoglobin 13.0 - 17.0 g/dL 11.6  14.2  13.0   Hematocrit 39.0 - 52.0 % 35.2  41.1  38.1   Platelets 150 - 400 K/uL 136  197  166     Current Medications, Allergies, Past Medical History, Past Surgical History, Family History and Social History were reviewed in [employer] record.   Physical Exam: General: Well developed, well nourished, no acute distress Head: Normocephalic and atraumatic Eyes: Sclerae anicteric, EOMI Ears: Normal auditory acuity Mouth: No deformities or lesions noted Lungs: Clear throughout to auscultation Heart: Regular rate and rhythm; No murmurs, rubs or bruits Abdomen: Soft, non tender and  non distended. No masses, hepatosplenomegaly or hernias noted. Normal Bowel sounds Rectal: Not done Musculoskeletal: Symmetrical with no gross deformities  Pulses:  Normal pulses noted Extremities: No edema or deformities noted Neurological: Alert oriented x 4, grossly nonfocal Psychological:  Alert and cooperative. Normal mood and affect   Jennifermarie Franzen T. Fuller Plan, MD 03/03/2022, 10:26 AM

## 2022-03-15 ENCOUNTER — Telehealth: Payer: Self-pay | Admitting: Gastroenterology

## 2022-03-15 NOTE — Telephone Encounter (Signed)
Inbound call from patient, states he has not had a bowel movement in 5 days and is feeling extremely bloated. Is requesting medication to be sent in to be able to have a bowel movement. Patient states he has tried Dulcolax and also Miralax and has seen no improvement. Please advise.

## 2022-03-15 NOTE — Telephone Encounter (Signed)
Left message on machine to call back  

## 2022-03-16 NOTE — Telephone Encounter (Signed)
The pt was last seen in the office on 03/03/22 with Dr Fuller Plan.  He has chronic constipation, he is taking miralax 1-2 capfuls and linzess 290 mcg daily.  He states he has been taking dulcolax as well.  He has been advised to do a miralax bowel purge as stated below:   Purchase a bottle of Miralax over the counter as well as a box of 5 mg dulcolax tablets. Take 4 dulcolax tablets. Wait 1 hour. You will then drink 6-8 capfuls of Miralax mixed in an adequate amount of water/juice/gatorade (you may choose which of these liquids to drink) over the next 2-3 hours. You should expect results within 1 to 6 hours after completing the bowel purge.   He will do this today and call back if he does not have a positive response.  He will continue his Linzess and miralax daily and call back if he has any further concerns.

## 2022-04-14 DIAGNOSIS — Z936 Other artificial openings of urinary tract status: Secondary | ICD-10-CM | POA: Diagnosis not present

## 2022-04-28 DIAGNOSIS — I700 Atherosclerosis of aorta: Secondary | ICD-10-CM | POA: Diagnosis not present

## 2022-04-28 DIAGNOSIS — I10 Essential (primary) hypertension: Secondary | ICD-10-CM | POA: Diagnosis not present

## 2022-04-28 DIAGNOSIS — N183 Chronic kidney disease, stage 3 unspecified: Secondary | ICD-10-CM | POA: Diagnosis not present

## 2022-04-28 DIAGNOSIS — E1122 Type 2 diabetes mellitus with diabetic chronic kidney disease: Secondary | ICD-10-CM | POA: Diagnosis not present

## 2022-04-28 DIAGNOSIS — E782 Mixed hyperlipidemia: Secondary | ICD-10-CM | POA: Diagnosis not present

## 2022-04-28 DIAGNOSIS — Z936 Other artificial openings of urinary tract status: Secondary | ICD-10-CM | POA: Diagnosis not present

## 2022-04-28 DIAGNOSIS — N390 Urinary tract infection, site not specified: Secondary | ICD-10-CM | POA: Diagnosis not present

## 2022-04-28 DIAGNOSIS — G620 Drug-induced polyneuropathy: Secondary | ICD-10-CM | POA: Diagnosis not present

## 2022-04-28 DIAGNOSIS — K219 Gastro-esophageal reflux disease without esophagitis: Secondary | ICD-10-CM | POA: Diagnosis not present

## 2022-06-22 ENCOUNTER — Encounter: Payer: Self-pay | Admitting: Gastroenterology

## 2022-07-13 DIAGNOSIS — Z936 Other artificial openings of urinary tract status: Secondary | ICD-10-CM | POA: Diagnosis not present

## 2022-07-14 ENCOUNTER — Telehealth: Payer: Self-pay

## 2022-07-14 ENCOUNTER — Ambulatory Visit (AMBULATORY_SURGERY_CENTER): Payer: Medicare HMO

## 2022-07-14 VITALS — Ht 66.0 in | Wt 165.0 lb

## 2022-07-14 DIAGNOSIS — Z936 Other artificial openings of urinary tract status: Secondary | ICD-10-CM | POA: Diagnosis not present

## 2022-07-14 DIAGNOSIS — K5900 Constipation, unspecified: Secondary | ICD-10-CM

## 2022-07-14 DIAGNOSIS — Z85038 Personal history of other malignant neoplasm of large intestine: Secondary | ICD-10-CM

## 2022-07-14 DIAGNOSIS — Z86010 Personal history of colonic polyps: Secondary | ICD-10-CM

## 2022-07-14 MED ORDER — NA SULFATE-K SULFATE-MG SULF 17.5-3.13-1.6 GM/177ML PO SOLN: 1.0000 | Freq: Once | ORAL | 0 refills | Status: AC

## 2022-07-14 NOTE — Progress Notes (Signed)
No egg or soy allergy known to patient  No issues known to pt with past sedation with any surgeries or procedures Patient denies ever being told they had issues or difficulty with intubation  No FH of Malignant Hyperthermia Pt is not on diet pills Pt is not on  home 02  Pt is not on blood thinners  Pt with chronic constipation states he is doing much better since taking linzess. BM every couple of days with occasional straining  No A fib or A flutter - hx of AFIB 10 years ago  Have any cardiac testing pending--no Pt is ambulatory   Patient's chart reviewed by Cathlyn Parsons CNRA prior to previsit and patient appropriate for the [facility].  Previsit completed and red dot placed by patient's name on their procedure day (on provider's schedule).     PV completed with patient. Instructions reviewed and sent out via mychart and to address on file. Goodrx coupon for [employer] provided Pt instructed to use Single[employer].com or GoodRx for a price reduction on prep

## 2022-07-15 NOTE — Telephone Encounter (Signed)
PV completed.  °

## 2022-07-23 DIAGNOSIS — M79671 Pain in right foot: Secondary | ICD-10-CM | POA: Diagnosis not present

## 2022-07-23 DIAGNOSIS — M79672 Pain in left foot: Secondary | ICD-10-CM | POA: Diagnosis not present

## 2022-07-23 DIAGNOSIS — Z79899 Other long term (current) drug therapy: Secondary | ICD-10-CM | POA: Diagnosis not present

## 2022-07-23 DIAGNOSIS — E291 Testicular hypofunction: Secondary | ICD-10-CM | POA: Diagnosis not present

## 2022-07-27 ENCOUNTER — Encounter: Payer: Self-pay | Admitting: Gastroenterology

## 2022-08-05 ENCOUNTER — Encounter: Payer: Self-pay | Admitting: Certified Registered Nurse Anesthetist

## 2022-08-10 ENCOUNTER — Ambulatory Visit (AMBULATORY_SURGERY_CENTER): Payer: Medicare HMO | Admitting: Gastroenterology

## 2022-08-10 ENCOUNTER — Encounter: Payer: Self-pay | Admitting: Gastroenterology

## 2022-08-10 VITALS — BP 120/70 | HR 75 | Temp 98.8°F | Resp 12 | Ht 66.0 in | Wt 165.0 lb

## 2022-08-10 DIAGNOSIS — Z08 Encounter for follow-up examination after completed treatment for malignant neoplasm: Secondary | ICD-10-CM | POA: Diagnosis not present

## 2022-08-10 DIAGNOSIS — I4891 Unspecified atrial fibrillation: Secondary | ICD-10-CM | POA: Diagnosis not present

## 2022-08-10 DIAGNOSIS — I10 Essential (primary) hypertension: Secondary | ICD-10-CM | POA: Diagnosis not present

## 2022-08-10 DIAGNOSIS — K635 Polyp of colon: Secondary | ICD-10-CM | POA: Diagnosis not present

## 2022-08-10 DIAGNOSIS — Z86010 Personal history of colonic polyps: Secondary | ICD-10-CM

## 2022-08-10 DIAGNOSIS — D123 Benign neoplasm of transverse colon: Secondary | ICD-10-CM

## 2022-08-10 DIAGNOSIS — Z85038 Personal history of other malignant neoplasm of large intestine: Secondary | ICD-10-CM

## 2022-08-10 DIAGNOSIS — R7303 Prediabetes: Secondary | ICD-10-CM | POA: Diagnosis not present

## 2022-08-10 MED ORDER — SODIUM CHLORIDE 0.9 % IV SOLN: 500.0000 mL | INTRAVENOUS | Status: DC

## 2022-08-10 NOTE — Patient Instructions (Signed)
Resume previous diet and medications. Awaiting pathology results. Repeat Colonoscopy likely in 3 years pending pathology results. Handouts given on Colon polyps  YOU HAD AN ENDOSCOPIC PROCEDURE TODAY AT THE [facility]:   Refer to the procedure report that was given to you for any specific questions about what was found during the examination.  If the procedure report does not answer your questions, please call your gastroenterologist to clarify.  If you requested that your care partner not be given the details of your procedure findings, then the procedure report has been included in a sealed envelope for you to review at your convenience later.  YOU SHOULD EXPECT: Some feelings of bloating in the abdomen. Passage of more gas than usual.  Walking can help get rid of the air that was put into your GI tract during the procedure and reduce the bloating. If you had a lower endoscopy (such as a colonoscopy or flexible sigmoidoscopy) you may notice spotting of blood in your stool or on the toilet paper. If you underwent a bowel prep for your procedure, you may not have a normal bowel movement for a few days.  Please Note:  You might notice some irritation and congestion in your nose or some drainage.  This is from the oxygen used during your procedure.  There is no need for concern and it should clear up in a day or so.  SYMPTOMS TO REPORT IMMEDIATELY:  Following lower endoscopy (colonoscopy or flexible sigmoidoscopy):  Excessive amounts of blood in the stool  Significant tenderness or worsening of abdominal pains  Swelling of the abdomen that is new, acute  Fever of 100F or higher   For urgent or emergent issues, a gastroenterologist can be reached at any hour by calling [phone]. Do not use MyChart messaging for urgent concerns.    DIET:  We do recommend a small meal at first, but then you may proceed to your regular diet.  Drink plenty of fluids but you should avoid alcoholic  beverages for 24 hours.  ACTIVITY:  You should plan to take it easy for the rest of today and you should NOT DRIVE or use heavy machinery until tomorrow (because of the sedation medicines used during the test).    FOLLOW UP: Our staff will call the number listed on your records the next business day following your procedure.  We will call around 7:15- 8:00 am to check on you and address any questions or concerns that you may have regarding the information given to you following your procedure. If we do not reach you, we will leave a message.     If any biopsies were taken you will be contacted by phone or by letter within the next 1-3 weeks.  Please call us at [phone] if you have not heard about the biopsies in 3 weeks.    SIGNATURES/CONFIDENTIALITY: You and/or your care partner have signed paperwork which will be entered into your electronic medical record.  These signatures attest to the fact that that the information above on your After Visit Summary has been reviewed and is understood.  Full responsibility of the confidentiality of this discharge information lies with you and/or your care-partner.

## 2022-08-10 NOTE — Progress Notes (Signed)
Report given to PACU, vss 

## 2022-08-10 NOTE — Progress Notes (Signed)
Data will not transfer from monitor, vs being posted manually.   

## 2022-08-10 NOTE — Progress Notes (Signed)
Called to room to assist during endoscopic procedure.  Patient ID and intended procedure confirmed with present staff. Received instructions for my participation in the procedure from the performing physician.  

## 2022-08-10 NOTE — Progress Notes (Signed)
Patient reports no health or medicine changes since pre visit. 

## 2022-08-10 NOTE — Op Note (Addendum)
[facility] Patient Name: Mark [name] Procedure Date: 08/10/2022 4:32 PM MRN: [id number] Endoscopist: Meryl Dare , MD, [phone] Age: 72 Referring MD:  Date of Birth: May 31, 1950 Gender: Male Account #: [id number] Procedure:                Colonoscopy Indications:              High risk colon cancer surveillance: Personal                            history of colon cancer, Personal history of                            multiple adenomatous colon polyps Medicines:                Monitored Anesthesia Care Procedure:                Pre-Anesthesia Assessment:                           - Prior to the procedure, a History and Physical                            was performed, and patient medications and                            allergies were reviewed. The patient's tolerance of                            previous anesthesia was also reviewed. The risks                            and benefits of the procedure and the sedation                            options and risks were discussed with the patient.                            All questions were answered, and informed consent                            was obtained. Prior Anticoagulants: The patient has                            taken no anticoagulant or antiplatelet agents. ASA                            Grade Assessment: II - A patient with mild systemic                            disease. After reviewing the risks and benefits,                            the patient was deemed in satisfactory condition to  undergo the procedure.                           After obtaining informed consent, the colonoscope                            was passed under direct vision. Throughout the                            procedure, the patient's blood pressure, pulse, and                            oxygen saturations were monitored continuously. The                            Olympus SN [id number] was introduced  through the anus                            and advanced to the the cecum, identified by                            appendiceal orifice and ileocecal valve. The                            ileocecal valve, appendiceal orifice, and rectum                            were photographed. The quality of the bowel                            preparation was good. The colonoscopy was performed                            without difficulty. The patient tolerated the                            procedure well. Technical problem with server so                            Provation not available during the procedure: no                            photos, times. Scope In: 3:33:22 PM Scope Out: 3:52:05 PM Scope Withdrawal Time: 0 hours 16 minutes 11 seconds  Total Procedure Duration: 0 hours 18 minutes 43 seconds  Findings:                 The perianal and digital rectal examinations were                            normal.                           Four sessile polyps were found in the transverse  colon (2) and hepatic flexure. The polyps were 4 to                            7 mm in size. These polyps were removed with a cold                            snare. Resection and retrieval were complete.                           There was evidence of a prior functional end-to-end                            colo-colonic anastomosis in the sigmoid colon. This                            was patent and was characterized by healthy                            appearing mucosa. The anastomosis was traversed.                           The exam was otherwise without abnormality on                            direct and retroflexion views. Complications:            No immediate complications. Estimated blood loss:                            None. Estimated Blood Loss:     Estimated blood loss: none. Impression:               - Four 4 to 7 mm polyps in the transverse colon and                             at the hepatic flexure, removed with a cold snare.                            Resected and retrieved.                           - Patent end-to-end colo-colonic anastomosis,                            characterized by healthy appearing mucosa.                           - The examination was otherwise normal on direct                            and retroflexion views. Recommendation:           - Repeat colonoscopy, likely 3 years, after studies  are complete for surveillance based on pathology                            results.                           - Patient has a contact number available for                            emergencies. The signs and symptoms of potential                            delayed complications were discussed with the                            patient. Return to normal activities tomorrow.                            Written discharge instructions were provided to the                            patient.                           - Resume previous diet.                           - Continue present medications.                           - Await pathology results. Meryl Dare, MD 08/10/2022 4:35:59 PM This report has been signed electronically.

## 2022-08-10 NOTE — Progress Notes (Signed)
History & Physical  Primary Care Physician:  Irven Coe, MD Primary Gastroenterologist: Claudette Head, MD  Impression / Plan:  Personal history of colon cancer, S/P sigmoid colectomy in 2001 and personal history of multiple adenomatous colon polyps for colonoscopy.  CHIEF COMPLAINT: Personal history of colon cancer, Personal history of colon polyps   HPI: Mark [name] is a 72 y.o. male with a personal history of colon cancer, S/P sigmoid colectomy in 2001 and personal history of multiple adenomatous colon polyps for colonoscopy.   Past Medical History:  Diagnosis Date   Atrial fibrillation [facility])    Bladder cancer (HCC) 2006   Cataracts, both eyes    Chronic kidney disease     BLADDER CANCER 2006  UROSTOMY    Colon adenocarcinoma (HCC) 2001   T2, N1   Erosive esophagitis    GERD (gastroesophageal reflux disease)    Hiatal hernia    History of blood transfusion    Hyperlipidemia    Hypertension    Internal hemorrhoids    Neuropathy    feet   PONV (postoperative nausea and vomiting)    Prediabetes    Tubular adenoma of colon 12/2009    Past Surgical History:  Procedure Laterality Date   APPENDECTOMY     BLADDER REMOVAL  2006   has urostomy//hx bladder ca   CARDIAC CATHETERIZATION     CHOLECYSTECTOMY     1999   COLON SURGERY     COLONOSCOPY  2022   PROSTATECTOMY  2006   SPINAL CORD STIMULATOR REMOVAL  12/29/2010   Procedure: CERVICAL SPINAL CORD STIMULATOR REMOVAL;  Surgeon: Dorian Heckle, MD;  Location: [facility];  Service: Neurosurgery;  Laterality: N/A;  REMOVAL OF SPINAL CORD STIMULATOR, ELECTRODE AND IPG    Prior to Admission medications   Medication Sig Start Date End Date Taking? Authorizing Provider  aspirin 325 MG tablet Take 325 mg by mouth daily. 05/11/17  Yes [provider]  bisacodyl (DULCOLAX) 5 MG EC tablet Take 5 mg by mouth daily as needed. 05/11/17  Yes [provider]  Cholecalciferol 25 MCG (1000 UT) CHEW Chew 1,000  Units by mouth daily. 05/11/17  Yes [provider]  diltiazem (CARDIZEM CD) 240 MG 24 hr capsule Take 240 mg by mouth daily.  09/18/12  Yes [provider]  LINZESS 290 MCG CAPS capsule 1 capsule Orally Once a day for 90 days 10/21/20  Yes [provider]  LUTEIN PO Take by mouth. 05/11/17  Yes [provider]  MAGNESIUM PO Take 1 tablet by mouth daily.   Yes [provider]  metFORMIN (GLUCOPHAGE) 500 MG tablet Take 500 mg by mouth once. 06/10/22  Yes [provider]  Multiple Vitamins-Minerals (MULTIVITAMINS THER. W/MINERALS) TABS Take 1 tablet by mouth daily.   Yes [provider]  Multiple Vitamins-Minerals (ZINC PO) Take 1 tablet by mouth daily.   Yes [provider]  nitrofurantoin (MACRODANTIN) 100 MG capsule Take 100 mg by mouth daily. 11/19/21  Yes [provider]  omeprazole (PRILOSEC) 20 MG capsule Take 2 capsules (40 mg total) by mouth 2 (two) times daily. Patient taking differently: Take 40 mg by mouth as needed. 09/23/11  Yes Meryl Dare, MD  Oxycodone HCl 10 MG TABS  05/29/20  Yes [provider]  temazepam (RESTORIL) 30 MG capsule Take 30 mg by mouth at bedtime as needed. For sleep.   Yes [provider]    Current Outpatient Medications  Medication Sig Dispense  Refill   aspirin 325 MG tablet Take 325 mg by mouth daily.     bisacodyl (DULCOLAX) 5 MG EC tablet Take 5 mg by mouth daily as needed.     Cholecalciferol 25 MCG (1000 UT) CHEW Chew 1,000 Units by mouth daily.     diltiazem (CARDIZEM CD) 240 MG 24 hr capsule Take 240 mg by mouth daily.      LINZESS 290 MCG CAPS capsule 1 capsule Orally Once a day for 90 days     LUTEIN PO Take by mouth.     MAGNESIUM PO Take 1 tablet by mouth daily.     metFORMIN (GLUCOPHAGE) 500 MG tablet Take 500 mg by mouth once.     Multiple Vitamins-Minerals (MULTIVITAMINS THER. W/MINERALS) TABS Take 1 tablet by mouth daily.     Multiple  Vitamins-Minerals (ZINC PO) Take 1 tablet by mouth daily.     nitrofurantoin (MACRODANTIN) 100 MG capsule Take 100 mg by mouth daily.     omeprazole (PRILOSEC) 20 MG capsule Take 2 capsules (40 mg total) by mouth 2 (two) times daily. (Patient taking differently: Take 40 mg by mouth as needed.) 60 capsule 11   Oxycodone HCl 10 MG TABS      temazepam (RESTORIL) 30 MG capsule Take 30 mg by mouth at bedtime as needed. For sleep.     Current Facility-Administered Medications  Medication Dose Route Frequency Provider Last Rate Last Admin   0.9 %  sodium chloride infusion  500 mL Intravenous Continuous Meryl Dare, MD        Allergies as of 08/10/2022 - Review Complete 08/10/2022  Allergen Reaction Noted   Penicillins Hives and Other (See Comments) 12/29/2010   Atorvastatin  04/09/2020   Contrast media  [iodinated contrast media]  10/10/2013    Family History  Problem Relation Age of Onset   Heart failure Mother    Breast cancer Sister    Colon cancer Neg Hx    Stomach cancer Neg Hx    Esophageal cancer Neg Hx    Rectal cancer Neg Hx    Colon polyps Neg Hx     Social History   Socioeconomic History   Marital status: Divorced    Spouse name: Not on file   Number of children: 2   Years of education: Not on file   Highest education level: Not on file  Occupational History   Occupation: retired  Tobacco Use   Smoking status: Never   Smokeless tobacco: Never  Vaping Use   Vaping Use: Never used  Substance and Sexual Activity   Alcohol use: No   Drug use: No   Sexual activity: Not on file  Other Topics Concern   Not on file  Social History Narrative   Not on file   Social Determinants of Health   Financial Resource Strain: Not on file  Food Insecurity: Not on file  Transportation Needs: Not on file  Physical Activity: Not on file  Stress: Not on file  Social Connections: Not on file  Intimate Partner Violence: Not on file    Review of Systems:  All systems  reviewed were negative except where noted in HPI.   Physical Exam:  General:  Alert, well-developed, in NAD Head:  Normocephalic and atraumatic. Eyes:  Sclera clear, no icterus.   Conjunctiva pink. Ears:  Normal auditory acuity. Mouth:  No deformity or lesions.  Neck:  Supple; no masses. Lungs:  Clear throughout to auscultation.   No wheezes, crackles, or rhonchi.  Heart:  Regular rate and rhythm; no murmurs. Abdomen:  Soft, nondistended, nontender. No masses, hepatomegaly. No palpable masses.  Normal bowel sounds.    Rectal:  Deferred   Msk:  Symmetrical without gross deformities. Extremities:  Without edema. Neurologic:  Alert and  oriented x 4; grossly normal neurologically. Skin:  Intact without significant lesions or rashes. Psych:  Alert and cooperative. Normal mood and affect.   Venita Lick. Russella Dar  08/10/2022, 3:23 PM See Loretha Stapler, [facility], to contact our on call provider

## 2022-08-11 ENCOUNTER — Telehealth: Payer: Self-pay | Admitting: *Deleted

## 2022-08-11 NOTE — Telephone Encounter (Signed)
Follow up call attempt.  LVM to call if any questions or concerns. 

## 2022-08-22 ENCOUNTER — Encounter: Payer: Self-pay | Admitting: Gastroenterology

## 2022-08-31 DIAGNOSIS — D2261 Melanocytic nevi of right upper limb, including shoulder: Secondary | ICD-10-CM | POA: Diagnosis not present

## 2022-08-31 DIAGNOSIS — L570 Actinic keratosis: Secondary | ICD-10-CM | POA: Diagnosis not present

## 2022-08-31 DIAGNOSIS — D485 Neoplasm of uncertain behavior of skin: Secondary | ICD-10-CM | POA: Diagnosis not present

## 2022-08-31 DIAGNOSIS — C44519 Basal cell carcinoma of skin of other part of trunk: Secondary | ICD-10-CM | POA: Diagnosis not present

## 2022-08-31 DIAGNOSIS — C44319 Basal cell carcinoma of skin of other parts of face: Secondary | ICD-10-CM | POA: Diagnosis not present

## 2022-08-31 DIAGNOSIS — C44329 Squamous cell carcinoma of skin of other parts of face: Secondary | ICD-10-CM | POA: Diagnosis not present

## 2022-08-31 DIAGNOSIS — L821 Other seborrheic keratosis: Secondary | ICD-10-CM | POA: Diagnosis not present

## 2022-08-31 DIAGNOSIS — D225 Melanocytic nevi of trunk: Secondary | ICD-10-CM | POA: Diagnosis not present

## 2022-08-31 DIAGNOSIS — B078 Other viral warts: Secondary | ICD-10-CM | POA: Diagnosis not present

## 2022-08-31 DIAGNOSIS — D2262 Melanocytic nevi of left upper limb, including shoulder: Secondary | ICD-10-CM | POA: Diagnosis not present

## 2022-09-16 ENCOUNTER — Telehealth: Payer: Self-pay | Admitting: Gastroenterology

## 2022-09-16 NOTE — Telephone Encounter (Signed)
The pt has been advised that results letter has been mailed and sent to My Chart. I also read to the letter to him and all questions answered to the best of my ability

## 2022-09-16 NOTE — Telephone Encounter (Signed)
Mark [name]                                                                                                            [address] [place] [address]                                                                                      Dear Mr. Lane,   The polyps removed from your colon were benign, however they were precancerous. This means that they had the potential to change into cancer over time had they not been removed.   I recommend you have a repeat colonoscopy in 3 years to determine if you have developed any new polyps and to screen for colorectal cancer.    Emerging evidence supports eating a diet of fruits, vegetables, grains, calcium, and yogurt while reducing red meat and alcohol may reduce the risk of colon cancer.    If you develop any new rectal bleeding, abdominal pain or significant bowel habit changes, please contact our office at [phone]   Please call us if you have persistent problems or have questions about your condition that have not been fully answered at this time.   Sincerely,   Meryl Dare, MD            [facility]                                Mark [name], [phone]                       1

## 2022-09-16 NOTE — Telephone Encounter (Signed)
Inbound call from patient wishing to discuss his 6/25 colonoscopy results. Requesting a call back. Please advise, thank you.

## 2022-09-22 DIAGNOSIS — C44319 Basal cell carcinoma of skin of other parts of face: Secondary | ICD-10-CM | POA: Diagnosis not present

## 2022-09-22 DIAGNOSIS — Z85828 Personal history of other malignant neoplasm of skin: Secondary | ICD-10-CM | POA: Diagnosis not present

## 2022-10-13 DIAGNOSIS — Z936 Other artificial openings of urinary tract status: Secondary | ICD-10-CM | POA: Diagnosis not present

## 2022-11-10 DIAGNOSIS — R7303 Prediabetes: Secondary | ICD-10-CM | POA: Diagnosis not present

## 2022-11-10 DIAGNOSIS — E782 Mixed hyperlipidemia: Secondary | ICD-10-CM | POA: Diagnosis not present

## 2022-11-10 DIAGNOSIS — K219 Gastro-esophageal reflux disease without esophagitis: Secondary | ICD-10-CM | POA: Diagnosis not present

## 2022-11-10 DIAGNOSIS — I10 Essential (primary) hypertension: Secondary | ICD-10-CM | POA: Diagnosis not present

## 2022-11-10 DIAGNOSIS — Z0000 Encounter for general adult medical examination without abnormal findings: Secondary | ICD-10-CM | POA: Diagnosis not present

## 2022-11-10 DIAGNOSIS — I700 Atherosclerosis of aorta: Secondary | ICD-10-CM | POA: Diagnosis not present

## 2022-11-10 DIAGNOSIS — Z23 Encounter for immunization: Secondary | ICD-10-CM | POA: Diagnosis not present

## 2022-11-10 DIAGNOSIS — G4700 Insomnia, unspecified: Secondary | ICD-10-CM | POA: Diagnosis not present

## 2022-11-10 DIAGNOSIS — G629 Polyneuropathy, unspecified: Secondary | ICD-10-CM | POA: Diagnosis not present

## 2022-11-12 DIAGNOSIS — N183 Chronic kidney disease, stage 3 unspecified: Secondary | ICD-10-CM | POA: Diagnosis not present

## 2022-11-12 DIAGNOSIS — E782 Mixed hyperlipidemia: Secondary | ICD-10-CM | POA: Diagnosis not present

## 2022-11-12 DIAGNOSIS — R7303 Prediabetes: Secondary | ICD-10-CM | POA: Diagnosis not present

## 2022-11-12 DIAGNOSIS — R5383 Other fatigue: Secondary | ICD-10-CM | POA: Diagnosis not present

## 2022-11-12 DIAGNOSIS — R109 Unspecified abdominal pain: Secondary | ICD-10-CM | POA: Diagnosis not present

## 2022-11-12 DIAGNOSIS — Z0000 Encounter for general adult medical examination without abnormal findings: Secondary | ICD-10-CM | POA: Diagnosis not present

## 2022-11-12 DIAGNOSIS — I10 Essential (primary) hypertension: Secondary | ICD-10-CM | POA: Diagnosis not present

## 2022-12-08 DIAGNOSIS — E291 Testicular hypofunction: Secondary | ICD-10-CM | POA: Diagnosis not present

## 2023-01-12 DIAGNOSIS — Z936 Other artificial openings of urinary tract status: Secondary | ICD-10-CM | POA: Diagnosis not present

## 2023-02-17 DIAGNOSIS — D485 Neoplasm of uncertain behavior of skin: Secondary | ICD-10-CM | POA: Diagnosis not present

## 2023-02-17 DIAGNOSIS — Z85828 Personal history of other malignant neoplasm of skin: Secondary | ICD-10-CM | POA: Diagnosis not present

## 2023-02-17 DIAGNOSIS — C44629 Squamous cell carcinoma of skin of left upper limb, including shoulder: Secondary | ICD-10-CM | POA: Diagnosis not present

## 2023-02-22 DIAGNOSIS — E1122 Type 2 diabetes mellitus with diabetic chronic kidney disease: Secondary | ICD-10-CM | POA: Diagnosis not present

## 2023-02-22 DIAGNOSIS — R7303 Prediabetes: Secondary | ICD-10-CM | POA: Diagnosis not present

## 2023-03-10 DIAGNOSIS — L570 Actinic keratosis: Secondary | ICD-10-CM | POA: Diagnosis not present

## 2023-03-10 DIAGNOSIS — Z85828 Personal history of other malignant neoplasm of skin: Secondary | ICD-10-CM | POA: Diagnosis not present

## 2023-03-10 DIAGNOSIS — C44329 Squamous cell carcinoma of skin of other parts of face: Secondary | ICD-10-CM | POA: Diagnosis not present

## 2023-03-10 DIAGNOSIS — C44319 Basal cell carcinoma of skin of other parts of face: Secondary | ICD-10-CM | POA: Diagnosis not present

## 2023-03-10 DIAGNOSIS — D0439 Carcinoma in situ of skin of other parts of face: Secondary | ICD-10-CM | POA: Diagnosis not present

## 2023-03-10 DIAGNOSIS — D485 Neoplasm of uncertain behavior of skin: Secondary | ICD-10-CM | POA: Diagnosis not present

## 2023-03-10 DIAGNOSIS — I788 Other diseases of capillaries: Secondary | ICD-10-CM | POA: Diagnosis not present

## 2023-03-10 DIAGNOSIS — L723 Sebaceous cyst: Secondary | ICD-10-CM | POA: Diagnosis not present

## 2023-03-10 DIAGNOSIS — L821 Other seborrheic keratosis: Secondary | ICD-10-CM | POA: Diagnosis not present

## 2023-04-13 DIAGNOSIS — Z936 Other artificial openings of urinary tract status: Secondary | ICD-10-CM | POA: Diagnosis not present

## 2023-04-16 DIAGNOSIS — R300 Dysuria: Secondary | ICD-10-CM | POA: Diagnosis not present

## 2023-07-12 DIAGNOSIS — Z936 Other artificial openings of urinary tract status: Secondary | ICD-10-CM | POA: Diagnosis not present

## 2023-07-16 ENCOUNTER — Other Ambulatory Visit: Payer: Self-pay

## 2023-07-16 ENCOUNTER — Emergency Department (HOSPITAL_COMMUNITY)

## 2023-07-16 ENCOUNTER — Inpatient Hospital Stay (HOSPITAL_COMMUNITY)
Admission: EM | Admit: 2023-07-16 | Discharge: 2023-08-03 | DRG: 234 | Disposition: A | Discharge status: Home Health | Attending: Thoracic Surgery (Cardiothoracic Vascular Surgery) | Admitting: Thoracic Surgery (Cardiothoracic Vascular Surgery)

## 2023-07-16 ENCOUNTER — Encounter (HOSPITAL_COMMUNITY): Payer: Self-pay | Admitting: Emergency Medicine

## 2023-07-16 DIAGNOSIS — N183 Chronic kidney disease, stage 3 unspecified: Secondary | ICD-10-CM | POA: Diagnosis not present

## 2023-07-16 DIAGNOSIS — Z9889 Other specified postprocedural states: Secondary | ICD-10-CM | POA: Diagnosis not present

## 2023-07-16 DIAGNOSIS — N390 Urinary tract infection, site not specified: Secondary | ICD-10-CM | POA: Diagnosis present

## 2023-07-16 DIAGNOSIS — T451X5S Adverse effect of antineoplastic and immunosuppressive drugs, sequela: Secondary | ICD-10-CM | POA: Diagnosis not present

## 2023-07-16 DIAGNOSIS — R918 Other nonspecific abnormal finding of lung field: Secondary | ICD-10-CM | POA: Diagnosis not present

## 2023-07-16 DIAGNOSIS — Z936 Other artificial openings of urinary tract status: Secondary | ICD-10-CM

## 2023-07-16 DIAGNOSIS — R110 Nausea: Secondary | ICD-10-CM | POA: Diagnosis not present

## 2023-07-16 DIAGNOSIS — Z48812 Encounter for surgical aftercare following surgery on the circulatory system: Secondary | ICD-10-CM | POA: Diagnosis not present

## 2023-07-16 DIAGNOSIS — Z87440 Personal history of urinary (tract) infections: Secondary | ICD-10-CM

## 2023-07-16 DIAGNOSIS — Z4682 Encounter for fitting and adjustment of non-vascular catheter: Secondary | ICD-10-CM | POA: Diagnosis not present

## 2023-07-16 DIAGNOSIS — R42 Dizziness and giddiness: Secondary | ICD-10-CM | POA: Diagnosis not present

## 2023-07-16 DIAGNOSIS — I129 Hypertensive chronic kidney disease with stage 1 through stage 4 chronic kidney disease, or unspecified chronic kidney disease: Secondary | ICD-10-CM | POA: Diagnosis present

## 2023-07-16 DIAGNOSIS — D6959 Other secondary thrombocytopenia: Secondary | ICD-10-CM | POA: Diagnosis not present

## 2023-07-16 DIAGNOSIS — I480 Paroxysmal atrial fibrillation: Secondary | ICD-10-CM | POA: Diagnosis present

## 2023-07-16 DIAGNOSIS — R079 Chest pain, unspecified: Principal | ICD-10-CM | POA: Diagnosis present

## 2023-07-16 DIAGNOSIS — Z85038 Personal history of other malignant neoplasm of large intestine: Secondary | ICD-10-CM

## 2023-07-16 DIAGNOSIS — J939 Pneumothorax, unspecified: Secondary | ICD-10-CM | POA: Diagnosis not present

## 2023-07-16 DIAGNOSIS — E785 Hyperlipidemia, unspecified: Secondary | ICD-10-CM | POA: Diagnosis present

## 2023-07-16 DIAGNOSIS — B9689 Other specified bacterial agents as the cause of diseases classified elsewhere: Secondary | ICD-10-CM | POA: Diagnosis present

## 2023-07-16 DIAGNOSIS — N1831 Chronic kidney disease, stage 3a: Secondary | ICD-10-CM | POA: Diagnosis present

## 2023-07-16 DIAGNOSIS — E7800 Pure hypercholesterolemia, unspecified: Secondary | ICD-10-CM | POA: Diagnosis not present

## 2023-07-16 DIAGNOSIS — R0789 Other chest pain: Secondary | ICD-10-CM | POA: Diagnosis not present

## 2023-07-16 DIAGNOSIS — R5381 Other malaise: Secondary | ICD-10-CM | POA: Diagnosis not present

## 2023-07-16 DIAGNOSIS — N3000 Acute cystitis without hematuria: Secondary | ICD-10-CM

## 2023-07-16 DIAGNOSIS — I25110 Atherosclerotic heart disease of native coronary artery with unstable angina pectoris: Secondary | ICD-10-CM | POA: Diagnosis not present

## 2023-07-16 DIAGNOSIS — E039 Hypothyroidism, unspecified: Secondary | ICD-10-CM | POA: Diagnosis not present

## 2023-07-16 DIAGNOSIS — I2510 Atherosclerotic heart disease of native coronary artery without angina pectoris: Secondary | ICD-10-CM | POA: Diagnosis present

## 2023-07-16 DIAGNOSIS — Z452 Encounter for adjustment and management of vascular access device: Secondary | ICD-10-CM | POA: Diagnosis not present

## 2023-07-16 DIAGNOSIS — R7989 Other specified abnormal findings of blood chemistry: Secondary | ICD-10-CM

## 2023-07-16 DIAGNOSIS — Z87891 Personal history of nicotine dependence: Secondary | ICD-10-CM

## 2023-07-16 DIAGNOSIS — Z951 Presence of aortocoronary bypass graft: Secondary | ICD-10-CM | POA: Diagnosis not present

## 2023-07-16 DIAGNOSIS — I214 Non-ST elevation (NSTEMI) myocardial infarction: Secondary | ICD-10-CM | POA: Diagnosis present

## 2023-07-16 DIAGNOSIS — Z66 Do not resuscitate: Secondary | ICD-10-CM | POA: Diagnosis present

## 2023-07-16 DIAGNOSIS — Z7984 Long term (current) use of oral hypoglycemic drugs: Secondary | ICD-10-CM

## 2023-07-16 DIAGNOSIS — R931 Abnormal findings on diagnostic imaging of heart and coronary circulation: Secondary | ICD-10-CM | POA: Diagnosis not present

## 2023-07-16 DIAGNOSIS — I10 Essential (primary) hypertension: Secondary | ICD-10-CM | POA: Diagnosis not present

## 2023-07-16 DIAGNOSIS — D62 Acute posthemorrhagic anemia: Secondary | ICD-10-CM | POA: Diagnosis not present

## 2023-07-16 DIAGNOSIS — I440 Atrioventricular block, first degree: Secondary | ICD-10-CM | POA: Diagnosis not present

## 2023-07-16 DIAGNOSIS — Z8551 Personal history of malignant neoplasm of bladder: Secondary | ICD-10-CM

## 2023-07-16 DIAGNOSIS — I700 Atherosclerosis of aorta: Secondary | ICD-10-CM | POA: Diagnosis not present

## 2023-07-16 DIAGNOSIS — Z91041 Radiographic dye allergy status: Secondary | ICD-10-CM | POA: Diagnosis not present

## 2023-07-16 DIAGNOSIS — G620 Drug-induced polyneuropathy: Secondary | ICD-10-CM | POA: Diagnosis present

## 2023-07-16 DIAGNOSIS — I9581 Postprocedural hypotension: Secondary | ICD-10-CM | POA: Diagnosis not present

## 2023-07-16 DIAGNOSIS — Z79899 Other long term (current) drug therapy: Secondary | ICD-10-CM

## 2023-07-16 DIAGNOSIS — R778 Other specified abnormalities of plasma proteins: Secondary | ICD-10-CM | POA: Diagnosis not present

## 2023-07-16 DIAGNOSIS — J90 Pleural effusion, not elsewhere classified: Secondary | ICD-10-CM | POA: Diagnosis not present

## 2023-07-16 DIAGNOSIS — I083 Combined rheumatic disorders of mitral, aortic and tricuspid valves: Secondary | ICD-10-CM | POA: Diagnosis not present

## 2023-07-16 DIAGNOSIS — R7303 Prediabetes: Secondary | ICD-10-CM | POA: Diagnosis present

## 2023-07-16 DIAGNOSIS — K449 Diaphragmatic hernia without obstruction or gangrene: Secondary | ICD-10-CM | POA: Diagnosis present

## 2023-07-16 DIAGNOSIS — I200 Unstable angina: Secondary | ICD-10-CM | POA: Diagnosis not present

## 2023-07-16 DIAGNOSIS — G8929 Other chronic pain: Secondary | ICD-10-CM | POA: Diagnosis present

## 2023-07-16 DIAGNOSIS — I4891 Unspecified atrial fibrillation: Secondary | ICD-10-CM | POA: Diagnosis not present

## 2023-07-16 DIAGNOSIS — N189 Chronic kidney disease, unspecified: Secondary | ICD-10-CM | POA: Diagnosis not present

## 2023-07-16 DIAGNOSIS — Z01810 Encounter for preprocedural cardiovascular examination: Secondary | ICD-10-CM | POA: Diagnosis not present

## 2023-07-16 DIAGNOSIS — Z7982 Long term (current) use of aspirin: Secondary | ICD-10-CM

## 2023-07-16 DIAGNOSIS — T451X5A Adverse effect of antineoplastic and immunosuppressive drugs, initial encounter: Secondary | ICD-10-CM | POA: Diagnosis not present

## 2023-07-16 DIAGNOSIS — I447 Left bundle-branch block, unspecified: Secondary | ICD-10-CM | POA: Diagnosis not present

## 2023-07-16 LAB — GLUCOSE, CAPILLARY
Glucose-Capillary: 121 mg/dL — ABNORMAL HIGH (ref 70–99)
Glucose-Capillary: 169 mg/dL — ABNORMAL HIGH (ref 70–99)

## 2023-07-16 LAB — BASIC METABOLIC PANEL WITH GFR
Anion gap: 12 (ref 5–15)
BUN: 24 mg/dL — ABNORMAL HIGH (ref 8–23)
CO2: 24 mmol/L (ref 22–32)
Calcium: 10.6 mg/dL — ABNORMAL HIGH (ref 8.9–10.3)
Chloride: 104 mmol/L (ref 98–111)
Creatinine, Ser: 1.45 mg/dL — ABNORMAL HIGH (ref 0.61–1.24)
GFR, Estimated: 51 mL/min — ABNORMAL LOW (ref >60)
Glucose, Bld: 112 mg/dL — ABNORMAL HIGH (ref 70–99)
Potassium: 4.1 mmol/L (ref 3.5–5.1)
Sodium: 140 mmol/L (ref 135–145)

## 2023-07-16 LAB — URINALYSIS, W/ REFLEX TO CULTURE (INFECTION SUSPECTED)
Bilirubin Urine: NEGATIVE
Glucose, UA: NEGATIVE mg/dL
Ketones, ur: NEGATIVE mg/dL
Nitrite: POSITIVE — AB
Protein, ur: 30 mg/dL — AB
Specific Gravity, Urine: 1.011 (ref 1.005–1.030)
WBC, UA: >50 WBC/hpf (ref 0–5)
pH: 6 (ref 5.0–8.0)

## 2023-07-16 LAB — CBC
HCT: 41.1 % (ref 39.0–52.0)
Hemoglobin: 13.7 g/dL (ref 13.0–17.0)
MCH: 29 pg (ref 26.0–34.0)
MCHC: 33.3 g/dL (ref 30.0–36.0)
MCV: 87.1 fL (ref 80.0–100.0)
Platelets: 160 10*3/uL (ref 150–400)
RBC: 4.72 MIL/uL (ref 4.22–5.81)
RDW: 14.1 % (ref 11.5–15.5)
WBC: 6.7 10*3/uL (ref 4.0–10.5)
nRBC: 0 % (ref 0.0–0.2)

## 2023-07-16 LAB — TROPONIN I (HIGH SENSITIVITY)
Troponin I (High Sensitivity): 18 ng/L — ABNORMAL HIGH (ref <18)
Troponin I (High Sensitivity): 23 ng/L — ABNORMAL HIGH (ref <18)

## 2023-07-16 MED ORDER — OXYCODONE HCL 5 MG PO TABS
5.0000 mg | ORAL_TABLET | Freq: Once | ORAL | Status: AC
  Administered 2023-07-16: 5 mg via ORAL
  Filled 2023-07-16: qty 1

## 2023-07-16 MED ORDER — MORPHINE SULFATE (PF) 2 MG/ML IV SOLN: 2.0000 mg | INTRAVENOUS | Status: DC | PRN

## 2023-07-16 MED ORDER — ASPIRIN 81 MG PO TBEC
81.0000 mg | DELAYED_RELEASE_TABLET | Freq: Every day | ORAL | Status: DC
  Administered 2023-07-17 – 2023-07-21 (×5): 81 mg via ORAL
  Filled 2023-07-16 (×5): qty 1

## 2023-07-16 MED ORDER — INSULIN ASPART 100 UNIT/ML IJ SOLN
0.0000 [IU] | Freq: Three times a day (TID) | INTRAMUSCULAR | Status: DC
  Administered 2023-07-16: 1 [IU] via SUBCUTANEOUS
  Administered 2023-07-17: 2 [IU] via SUBCUTANEOUS
  Administered 2023-07-17 – 2023-07-18 (×3): 3 [IU] via SUBCUTANEOUS
  Administered 2023-07-19 – 2023-07-20 (×3): 2 [IU] via SUBCUTANEOUS
  Administered 2023-07-20: 5 [IU] via SUBCUTANEOUS
  Administered 2023-07-21: 2 [IU] via SUBCUTANEOUS

## 2023-07-16 MED ORDER — INSULIN ASPART 100 UNIT/ML IJ SOLN: 0.0000 [IU] | Freq: Every day | INTRAMUSCULAR | Status: DC

## 2023-07-16 MED ORDER — DIPHENHYDRAMINE HCL 25 MG PO CAPS
50.0000 mg | ORAL_CAPSULE | Freq: Once | ORAL | Status: AC
  Administered 2023-07-17: 50 mg via ORAL
  Filled 2023-07-16: qty 2

## 2023-07-16 MED ORDER — METOPROLOL TARTRATE 50 MG PO TABS
50.0000 mg | ORAL_TABLET | Freq: Once | ORAL | Status: AC
  Administered 2023-07-16: 50 mg via ORAL
  Filled 2023-07-16: qty 1

## 2023-07-16 MED ORDER — ASPIRIN 81 MG PO TBEC: 81.0000 mg | DELAYED_RELEASE_TABLET | Freq: Every day | ORAL | Status: DC

## 2023-07-16 MED ORDER — ALUM & MAG HYDROXIDE-SIMETH 200-200-20 MG/5ML PO SUSP
15.0000 mL | ORAL | Status: AC | PRN
Start: 2023-07-16 — End: ?

## 2023-07-16 MED ORDER — SODIUM CHLORIDE 0.9 % IV SOLN
1.0000 g | Freq: Once | INTRAVENOUS | Status: AC
  Administered 2023-07-16: 1 g via INTRAVENOUS
  Filled 2023-07-16: qty 10

## 2023-07-16 MED ORDER — SODIUM CHLORIDE 0.9 % IV SOLN
1.0000 g | INTRAVENOUS | Status: AC
  Administered 2023-07-17 – 2023-07-21 (×5): 1 g via INTRAVENOUS
  Filled 2023-07-16 (×5): qty 10

## 2023-07-16 MED ORDER — ALUM & MAG HYDROXIDE-SIMETH 200-200-20 MG/5ML PO SUSP
30.0000 mL | Freq: Once | ORAL | Status: AC
  Administered 2023-07-16: 30 mL via ORAL
  Filled 2023-07-16: qty 30

## 2023-07-16 MED ORDER — ENOXAPARIN SODIUM 40 MG/0.4ML IJ SOSY
40.0000 mg | PREFILLED_SYRINGE | INTRAMUSCULAR | Status: DC
  Administered 2023-07-16 – 2023-07-21 (×6): 40 mg via SUBCUTANEOUS
  Filled 2023-07-16 (×6): qty 0.4

## 2023-07-16 MED ORDER — ACETAMINOPHEN 325 MG PO TABS
650.0000 mg | ORAL_TABLET | ORAL | Status: DC | PRN
  Administered 2023-07-17 – 2023-07-19 (×4): 650 mg via ORAL
  Filled 2023-07-16 (×4): qty 2

## 2023-07-16 MED ORDER — FAMOTIDINE IN NACL 20-0.9 MG/50ML-% IV SOLN
20.0000 mg | Freq: Once | INTRAVENOUS | Status: AC
  Administered 2023-07-16: 20 mg via INTRAVENOUS
  Filled 2023-07-16: qty 50

## 2023-07-16 MED ORDER — PREDNISONE 20 MG PO TABS
50.0000 mg | ORAL_TABLET | Freq: Four times a day (QID) | ORAL | Status: AC
  Administered 2023-07-16 – 2023-07-17 (×3): 50 mg via ORAL
  Filled 2023-07-16 (×3): qty 1

## 2023-07-16 MED ORDER — TEMAZEPAM 15 MG PO CAPS
30.0000 mg | ORAL_CAPSULE | Freq: Every evening | ORAL | Status: DC | PRN
  Administered 2023-07-23 – 2023-08-02 (×10): 30 mg via ORAL
  Filled 2023-07-16 (×10): qty 2

## 2023-07-16 MED ORDER — DULOXETINE HCL 30 MG PO CPEP
30.0000 mg | ORAL_CAPSULE | Freq: Every day | ORAL | Status: DC
  Administered 2023-07-17 – 2023-08-03 (×17): 30 mg via ORAL
  Filled 2023-07-16 (×17): qty 1

## 2023-07-16 MED ORDER — PANTOPRAZOLE SODIUM 40 MG IV SOLR
40.0000 mg | INTRAVENOUS | Status: DC
  Administered 2023-07-16: 40 mg via INTRAVENOUS
  Filled 2023-07-16: qty 10

## 2023-07-16 MED ORDER — NITROGLYCERIN 0.4 MG SL SUBL
0.4000 mg | SUBLINGUAL_TABLET | SUBLINGUAL | Status: DC | PRN
  Filled 2023-07-16: qty 1

## 2023-07-16 MED ORDER — ONDANSETRON HCL 4 MG/2ML IJ SOLN
4.0000 mg | Freq: Four times a day (QID) | INTRAMUSCULAR | Status: DC | PRN
  Administered 2023-07-16: 4 mg via INTRAVENOUS
  Filled 2023-07-16: qty 2

## 2023-07-16 MED ORDER — OXYCODONE HCL 5 MG PO TABS
10.0000 mg | ORAL_TABLET | Freq: Four times a day (QID) | ORAL | Status: DC | PRN
  Administered 2023-07-16 – 2023-07-22 (×15): 10 mg via ORAL
  Filled 2023-07-16 (×15): qty 2

## 2023-07-16 MED ORDER — SUCRALFATE 1 G PO TABS
1.0000 g | ORAL_TABLET | Freq: Once | ORAL | Status: AC
  Administered 2023-07-16: 1 g via ORAL
  Filled 2023-07-16: qty 1

## 2023-07-16 NOTE — ED Triage Notes (Signed)
 Pt to ER via EMS from home with c/o chest pressure starting around 4:30 this AM.  Pt states this did not wake him from sleep, but noted after waking.  Pt reports pain radiated to jaw but is now centralized in chest.  Pt reports nausea, shortness of breath and dizziness that have all now subsided. Pt to (2) 325mg  ASA before EMS arrival and EMS administered 1 SL NTG.

## 2023-07-16 NOTE — ED Provider Notes (Signed)
 [place] EMERGENCY DEPARTMENT AT [facility] Provider Note   CSN: [id number] Arrival date & time: 07/16/23  1054     History  Chief Complaint  Patient presents with   Chest Pain    Mark [name] is a 73 y.o. male.  Patient history of afib not on anticoagulation, bladder cancer status post urostomy, CKD, colon cancer in remission, GERD, hypertension, hyperlipidemia presents today with complaints of chest pain. He states that same began soon after he woke up this morning around 4:30 AM.  Pain feels like pressure and is in the middle of his chest.  States he feels like it radiates to his jaw.  Denies history of similar symptoms previously.  No cardiac history.  He has never smoked.  He called EMS for the symptoms who gave him aspirin and nitroglycerin.  The nitroglycerin did not relieve his pain.  He denies any shortness of breath.  No diaphoresis, nausea, or vomiting.  Also reports concern for UTI as he states "I am stuttering which is common for me when I have a UTI." He has been placed on daily Macrobid for UTI prophylaxis, notes that since starting this antibiotic he has not had any issues with UTIs.  Does note that unfortunately her his urologist left the practice 3 years ago and he subsequently lost contact with urology.  Denies abdominal pain, no fevers or chills.  The history is provided by the patient. No language interpreter was used.  Chest Pain      Home Medications Prior to Admission medications   Medication Sig Start Date End Date Taking? Authorizing Provider  aspirin 325 MG tablet Take 325 mg by mouth daily. 05/11/17   [provider]  bisacodyl (DULCOLAX) 5 MG EC tablet Take 5 mg by mouth daily as needed. 05/11/17   [provider]  Cholecalciferol 25 MCG (1000 UT) CHEW Chew 1,000 Units by mouth daily. 05/11/17   [provider]  diltiazem (CARDIZEM CD) 240 MG 24 hr capsule Take 240 mg by mouth daily.  09/18/12   [provider]   LINZESS 290 MCG CAPS capsule 1 capsule Orally Once a day for 90 days 10/21/20   [provider]  LUTEIN PO Take by mouth. 05/11/17   [provider]  MAGNESIUM PO Take 1 tablet by mouth daily.    [provider]  metFORMIN (GLUCOPHAGE) 500 MG tablet Take 500 mg by mouth once. 06/10/22   [provider]  Multiple Vitamins-Minerals (MULTIVITAMINS THER. W/MINERALS) TABS Take 1 tablet by mouth daily.    [provider]  Multiple Vitamins-Minerals (ZINC PO) Take 1 tablet by mouth daily.    [provider]  nitrofurantoin (MACRODANTIN) 100 MG capsule Take 100 mg by mouth daily. 11/19/21   [provider]  omeprazole  (PRILOSEC) 20 MG capsule Take 2 capsules (40 mg total) by mouth 2 (two) times daily. Patient taking differently: Take 40 mg by mouth as needed. 09/23/11   Asencion Blacksmith, MD  Oxycodone  HCl 10 MG TABS  05/29/20   [provider]  temazepam  (RESTORIL ) 30 MG capsule Take 30 mg by mouth at bedtime as needed. For sleep.    [provider]      Allergies    Penicillins, Atorvastatin, and Contrast media  [iodinated contrast media]    Review of Systems   Review of Systems  Cardiovascular:  Positive for chest pain.  All other systems reviewed and are negative.   Physical Exam Updated Vital Signs BP  136/67 (BP Location: Left Arm)   Pulse 74   Temp 98.3 F (36.8 C) (Oral)   Resp 19   Ht 5\' 6"  (1.676 m)   Wt 75.8 kg   SpO2 100%   BMI 26.95 kg/m  Physical Exam Vitals and nursing note reviewed.  Constitutional:      General: He is not in acute distress.    Appearance: Normal appearance. He is normal weight. He is not ill-appearing, toxic-appearing or diaphoretic.  HENT:     Head: Normocephalic and atraumatic.  Cardiovascular:     Rate and Rhythm: Normal rate and regular rhythm.     Pulses:          Dorsalis pedis pulses are 2+ on the right side and 2+ on the left side.       Posterior tibial pulses are  2+ on the right side and 2+ on the left side.     Heart sounds: Normal heart sounds.  Pulmonary:     Effort: Pulmonary effort is normal. No respiratory distress.     Breath sounds: Normal breath sounds.  Chest:     Chest wall: No tenderness.  Abdominal:     General: Abdomen is flat.     Palpations: Abdomen is soft.     Tenderness: There is no abdominal tenderness.     Comments: Urostomy in place, right anterior abdomen. Stoma is normal appearing, yellow clear appearing urine in bag  Musculoskeletal:        General: Normal range of motion.     Cervical back: Normal range of motion.     Right lower leg: No tenderness. No edema.     Left lower leg: No tenderness. No edema.  Skin:    General: Skin is warm and dry.  Neurological:     General: No focal deficit present.     Mental Status: He is alert.  Psychiatric:        Mood and Affect: Mood normal.        Behavior: Behavior normal.     ED Results / Procedures / Treatments   Labs (all labs ordered are listed, but only abnormal results are displayed) Labs Reviewed  URINALYSIS, W/ REFLEX TO CULTURE (INFECTION SUSPECTED) - Abnormal; Notable for the following components:      Result Value   Color, Urine AMBER (*)    APPearance CLOUDY (*)    Hgb urine dipstick SMALL (*)    Protein, ur 30 (*)    Nitrite POSITIVE (*)    Leukocytes,Ua LARGE (*)    Bacteria, UA RARE (*)    All other components within normal limits  BASIC METABOLIC PANEL WITH GFR - Abnormal; Notable for the following components:   Glucose, Bld 112 (*)    BUN 24 (*)    Creatinine, Ser 1.45 (*)    Calcium 10.6 (*)    GFR, Estimated 51 (*)    All other components within normal limits  TROPONIN I (HIGH SENSITIVITY) - Abnormal; Notable for the following components:   Troponin I (High Sensitivity) 18 (*)    All other components within normal limits  TROPONIN I (HIGH SENSITIVITY) - Abnormal; Notable for the following components:   Troponin I (High Sensitivity) 23 (*)     All other components within normal limits  URINE CULTURE  CBC    EKG EKG Interpretation Date/Time:  Saturday Jul 16 2023 10:59:23 EDT Ventricular Rate:  88 PR Interval:  185 QRS Duration:  119 QT Interval:  386 QTC  Calculation: 467 R Axis:   -57  Text Interpretation: Sinus rhythm Incomplete left bundle branch block Confirmed by Jerald Molly [phone]) on 07/16/2023 11:37:01 AM  Radiology DG Chest 2 View Result Date: 07/16/2023 CLINICAL DATA:  Chest pain EXAM: CHEST - 2 VIEW COMPARISON:  Chest x-ray performed September 04, 2018 FINDINGS: The heart size and mediastinal contours are within normal limits. Both lungs are clear. The visualized skeletal structures are unremarkable. IMPRESSION: No active cardiopulmonary disease. Electronically Signed   By: Reagan Camera M.D.   On: 07/16/2023 12:34    Procedures Procedures    Medications Ordered in ED Medications  alum & mag hydroxide-simeth (MAALOX/MYLANTA) 200-200-20 MG/5ML suspension 30 mL (30 mLs Oral Given 07/16/23 1334)  sucralfate (CARAFATE) tablet 1 g (1 g Oral Given 07/16/23 1334)  famotidine (PEPCID) IVPB 20 mg premix (0 mg Intravenous Stopped 07/16/23 1410)  cefTRIAXone  (ROCEPHIN ) 1 g in sodium chloride  0.9 % 100 mL IVPB (0 g Intravenous Stopped 07/16/23 1542)  oxyCODONE  (Oxy IR/ROXICODONE ) immediate release tablet 5 mg (5 mg Oral Given 07/16/23 1505)    ED Course/ Medical Decision Making/ A&P Clinical Course as of 07/16/23 1543  Sat Jul 16, 2023  1324 This is a 73 year old male with a history of chronic urostomy, bladder cancer and bladder resection, hiatal hernia.  Patient reports that he was in bed last night and around 4 5 AM began having pressure to middle of his chest that woke him up.  Some mild nausea.  He has never had this feeling before.  He called EMS who gave him aspirin as well as nitroglycerin.  The nitroglycerin did not help his pain.  He reports the pain is gradually eased up but is now 5 out of 10.  He denies  vomiting, sweating.  Denies history of coronary disease or MI, has a history of paroxysmal A-fib in the past that resolved.  He is not on anticoagulation.  He does feel that he may have a UTI as he reports his speech is "slow and stuttering which I get with urinary infections".  He is on daily nitrofurantoin prophylaxis.  He is not currently have a urologist.  On exam the patient is well-appearing.  He has dark, concentrated yellow urine in his urostomy bag.  He does not have reproducible chest pain but does have some mild epigastric discomfort.  Differential would include GERD or reflux given his hiatal hernia history and nonexertional onset of symptoms at night.  Less likely ACS but given his age and risk factors we will proceed with troponins and EKG.  His EKG per my review does not show acute ischemic findings.  Chest x-ray is unremarkable.  White blood cell count is normal.  Initial troponin is 18, repeat troponin is pending.  Patient is quite comfortable in the room. [MT]  1326 We will also collect a urine sample from his urostomy bag.  Low suspicion for sepsis. [MT]    Clinical Course User Index [MT] Arvilla Birmingham, MD             HEART Score: 5                    Medical Decision Making Amount and/or Complexity of Data Reviewed Labs: ordered. Radiology: ordered.  Risk OTC drugs. Prescription drug management.   This patient is a 73 y.o. male who presents to the ED for concern of chest pain, this involves an extensive number of treatment options, and is a complaint that carries with it  a high risk of complications and morbidity. The emergent differential diagnosis prior to evaluation includes, but is not limited to,  ACS, pericarditis, myocarditis, aortic dissection, PE, pneumothorax, esophageal rupture, pneumonia, reflux/PUD, biliary disease, pancreatitis, costochondritis, anxiety   This is not an exhaustive differential.   Past Medical History / Co-morbidities / Social History:   has a past medical history of Atrial fibrillation (HCC), Bladder cancer (HCC) (2006), Cataracts, both eyes, Chronic kidney disease, Colon adenocarcinoma (HCC) (2001), Erosive esophagitis, GERD (gastroesophageal reflux disease), Hiatal hernia, History of blood transfusion, Hyperlipidemia, Hypertension, Internal hemorrhoids, Neuropathy, PONV (postoperative nausea and vomiting), Prediabetes, and Tubular adenoma of colon (12/2009).  Additional history: Chart reviewed.  Physical Exam: Physical exam performed. The pertinent findings include: overall well appearing, no acute physical exam abnormalities  Lab Tests: I ordered, and personally interpreted labs.  The pertinent results include:  Creatinine 1.45 consistent with previous   Imaging Studies: I ordered imaging studies including CXR. I independently visualized and interpreted imaging which showed NAD. I agree with the radiologist interpretation.   Cardiac Monitoring:  The patient was maintained on a cardiac monitor.  My attending physician Dr. Gordon Latus viewed and interpreted the cardiac monitored which showed an underlying rhythm of: sinus rhythm, no STEMI. I agree with this interpretation.   Medications: I ordered medication including GI cocktail for pain, Rocephin   for UTI. Reevaluation of the patient after these medicines showed that the patient improved. I have reviewed the patients home medicines and have made adjustments as needed.  Consultations Obtained: I requested consultation with the cardiology on call Dr. Amanda Jungling,  and discussed lab and imaging findings as well as pertinent plan - they recommend: medicine admission, they will see the patient in consultation   Disposition: After consideration of the diagnostic results and the patients response to treatment, I feel that patient will require admission for intractable chest pain, cardiac work-up with elevated HEART score per cardiology recommendations. Additionally of note, does have UA  concerning for UTI treated with abx, will trend cultures.  Discussed patient with hospitalist Dr. Maury Space who accepts patient for admission.  This is a shared visit with supervising physician Dr. Gordon Latus who has independently evaluated patient & provided guidance in evaluation/management/disposition, in agreement with care     Final Clinical Impression(s) / ED Diagnoses Final diagnoses:  Chest pain, unspecified type  Elevated troponin  Acute cystitis without hematuria    Rx / DC Orders ED Discharge Orders     None         Fredna Jasper 07/16/23 1650    Arvilla Birmingham, MD 07/16/23 [phone]

## 2023-07-16 NOTE — H&P (Addendum)
 History and Physical    Mark [name] UXL:[id number] DOB: January 05, 1951 DOA: 07/16/2023  PCP: Benedetto Brady, MD   Patient coming from: Home  I have personally briefly reviewed patient's old medical records in [facility]  Chief Complaint: Chest pain  HPI: Mark [name] is a 73 y.o. male with medical history significant of paroxysmal A-fib not on anticoagulation, chronic kidney disease stage IIIa, bladder cancer status post urostomy, colon cancer in remission, GERD, hypertension, hyperlipidemia, prediabetes presented with chest pain.  He apparently woke up this morning around 4:30 AM and began feeling a pressure-like discomfort in the middle of his chest with radiation to the jaw.  He denied any shortness of breath, nausea, vomiting, diaphoresis.  Patient is concerned that he might be having another UTI: States that she is stuttering which is common for him when he has UTI.  Patient denies any fevers chills, abdominal pain, diarrhea, loss of consciousness, seizures, trauma to the chest.  EMS gave him aspirin and nitroglycerin.  ED Course: High-sensitivity troponins were 18 and 23.  EKG was nonischemic.  UA was suggestive of UTI.  He was given IV Rocephin .  Cardiology was consulted by ED provider. Hospitalist service was called to evaluate the patient.  Review of Systems: As per HPI otherwise all other systems were reviewed and are negative.   Past Medical History:  Diagnosis Date   Atrial fibrillation [facility])    Bladder cancer (HCC) 2006   Cataracts, both eyes    Chronic kidney disease     BLADDER CANCER 2006  UROSTOMY    Colon adenocarcinoma (HCC) 2001   T2, N1   Erosive esophagitis    GERD (gastroesophageal reflux disease)    Hiatal hernia    History of blood transfusion    Hyperlipidemia    Hypertension    Internal hemorrhoids    Neuropathy    feet   PONV (postoperative nausea and vomiting)    Prediabetes    Tubular adenoma of colon 12/2009    Past Surgical History:   Procedure Laterality Date   APPENDECTOMY     BLADDER REMOVAL  2006   has urostomy//hx bladder ca   CARDIAC CATHETERIZATION     CHOLECYSTECTOMY     1999   COLON SURGERY     COLONOSCOPY  2022   PROSTATECTOMY  2006   SPINAL CORD STIMULATOR REMOVAL  12/29/2010   Procedure: CERVICAL SPINAL CORD STIMULATOR REMOVAL;  Surgeon: Lei Pump, MD;  Location: [facility];  Service: Neurosurgery;  Laterality: N/A;  REMOVAL OF SPINAL CORD STIMULATOR, ELECTRODE AND IPG     reports that he has never smoked. He has never used smokeless tobacco. He reports that he does not drink alcohol and does not use drugs.  Allergies  Allergen Reactions   Penicillins Hives and Other (See Comments)    rash   Atorvastatin     Other reaction(s): muscle aches   Contrast Media  [Iodinated Contrast Media]     Family History  Problem Relation Age of Onset   Heart failure Mother    Breast cancer Sister    Colon cancer Neg Hx    Stomach cancer Neg Hx    Esophageal cancer Neg Hx    Rectal cancer Neg Hx    Colon polyps Neg Hx     Prior to Admission medications   Medication Sig Start Date End Date Taking? Authorizing Provider  aspirin 325 MG tablet Take 325 mg by mouth daily. 05/11/17   [provider]  bisacodyl (DULCOLAX) 5 MG EC tablet Take 5 mg by mouth daily as needed. 05/11/17   [provider]  Cholecalciferol 25 MCG (1000 UT) CHEW Chew 1,000 Units by mouth daily. 05/11/17   [provider]  diltiazem (CARDIZEM CD) 240 MG 24 hr capsule Take 240 mg by mouth daily.  09/18/12   [provider]  LINZESS 290 MCG CAPS capsule 1 capsule Orally Once a day for 90 days 10/21/20   [provider]  LUTEIN PO Take by mouth. 05/11/17   [provider]  MAGNESIUM PO Take 1 tablet by mouth daily.    [provider]  metFORMIN (GLUCOPHAGE) 500 MG tablet Take 500 mg by mouth once. 06/10/22   [provider]  Multiple Vitamins-Minerals (MULTIVITAMINS THER.  W/MINERALS) TABS Take 1 tablet by mouth daily.    [provider]  Multiple Vitamins-Minerals (ZINC PO) Take 1 tablet by mouth daily.    [provider]  nitrofurantoin (MACRODANTIN) 100 MG capsule Take 100 mg by mouth daily. 11/19/21   [provider]  omeprazole  (PRILOSEC) 20 MG capsule Take 2 capsules (40 mg total) by mouth 2 (two) times daily. Patient taking differently: Take 40 mg by mouth as needed. 09/23/11   Asencion Blacksmith, MD  Oxycodone  HCl 10 MG TABS  05/29/20   [provider]  temazepam  (RESTORIL ) 30 MG capsule Take 30 mg by mouth at bedtime as needed. For sleep.    [provider]    Physical Exam: Vitals:   07/16/23 1101 07/16/23 1106 07/16/23 1415 07/16/23 1416  BP: (!) 118/55  137/74 136/67  Pulse: 88  70 74  Resp: 15  16 19   Temp:  (!) 97.4 F (36.3 C)  98.3 F (36.8 C)  TempSrc:    Oral  SpO2: 100%  100% 100%  Weight:  75.8 kg    Height:  5\' 6"  (1.676 m)      Constitutional: NAD, calm, comfortable Vitals:   07/16/23 1101 07/16/23 1106 07/16/23 1415 07/16/23 1416  BP: (!) 118/55  137/74 136/67  Pulse: 88  70 74  Resp: 15  16 19   Temp:  (!) 97.4 F (36.3 C)  98.3 F (36.8 C)  TempSrc:    Oral  SpO2: 100%  100% 100%  Weight:  75.8 kg    Height:  5\' 6"  (1.676 m)     Eyes: PERRL, lids and conjunctivae normal ENMT: Mucous membranes are moist. Posterior pharynx clear of any exudate or lesions. Neck: normal, supple, no masses, no thyromegaly Respiratory: bilateral decreased breath sounds at bases, no wheezing, no crackles. Normal respiratory effort. No accessory muscle use.  Cardiovascular: S1 S2 positive, rate controlled. No extremity edema. 2+ pedal pulses.  Abdomen: no tenderness, no masses palpated. No hepatosplenomegaly. Bowel sounds positive.   Genitourinary: Urostomy present Musculoskeletal: no clubbing / cyanosis. No joint deformity upper and lower extremities.  Skin: no rashes, lesions, ulcers. No  induration Neurologic: CN 2-12 grossly intact. Moving extremities. No focal neurologic deficits.  Psychiatric: Normal judgment and insight. Alert and oriented x 3. Normal mood.    Labs on Admission: I have personally reviewed following labs and imaging studies  CBC: Recent Labs  Lab 07/16/23 1143  WBC 6.7  HGB 13.7  HCT 41.1  MCV 87.1  PLT 160   Basic Metabolic Panel: Recent Labs  Lab 07/16/23 1345  NA 140  K 4.1  CL 104  CO2 24  GLUCOSE 112*  BUN 24*  CREATININE 1.45*  CALCIUM 10.6*   GFR: Estimated Creatinine Clearance: 40.9 mL/min (A) (by C-G formula based on SCr of 1.45 mg/dL (H)). Liver Function Tests: No results for input(s): "AST", "ALT", "ALKPHOS", "BILITOT", "PROT", "ALBUMIN" in the last 168 hours. No results for input(s): "LIPASE", "AMYLASE" in the last 168 hours. No results for input(s): "AMMONIA" in the last 168 hours. Coagulation Profile: No results for input(s): "INR", "PROTIME" in the last 168 hours. Cardiac Enzymes: No results for input(s): "CKTOTAL", "CKMB", "CKMBINDEX", "TROPONINI" in the last 168 hours. BNP (last 3 results) No results for input(s): "PROBNP" in the last 8760 hours. HbA1C: No results for input(s): "HGBA1C" in the last 72 hours. CBG: No results for input(s): "GLUCAP" in the last 168 hours. Lipid Profile: No results for input(s): "CHOL", "HDL", "LDLCALC", "TRIG", "CHOLHDL", "LDLDIRECT" in the last 72 hours. Thyroid Function Tests: No results for input(s): "TSH", "T4TOTAL", "FREET4", "T3FREE", "THYROIDAB" in the last 72 hours. Anemia Panel: No results for input(s): "VITAMINB12", "FOLATE", "FERRITIN", "TIBC", "IRON", "RETICCTPCT" in the last 72 hours. Urine analysis:    Component Value Date/Time   COLORURINE AMBER (A) 07/16/2023 1325   APPEARANCEUR CLOUDY (A) 07/16/2023 1325   LABSPEC 1.011 07/16/2023 1325   PHURINE 6.0 07/16/2023 1325   GLUCOSEU NEGATIVE 07/16/2023 1325   HGBUR SMALL (A) 07/16/2023 1325   BILIRUBINUR NEGATIVE  07/16/2023 1325   KETONESUR NEGATIVE 07/16/2023 1325   PROTEINUR 30 (A) 07/16/2023 1325   UROBILINOGEN 0.2 10/12/2009 1457   NITRITE POSITIVE (A) 07/16/2023 1325   LEUKOCYTESUR LARGE (A) 07/16/2023 1325    Radiological Exams on Admission: DG Chest 2 View Result Date: 07/16/2023 CLINICAL DATA:  Chest pain EXAM: CHEST - 2 VIEW COMPARISON:  Chest x-ray performed September 04, 2018 FINDINGS: The heart size and mediastinal contours are within normal limits. Both lungs are clear. The visualized skeletal structures are unremarkable. IMPRESSION: No active cardiopulmonary disease. Electronically Signed   By: Reagan Camera M.D.   On: 07/16/2023 12:34    EKG: Independently reviewed.  No ST elevations or depressions noted.  Assessment/Plan  Chest pain - Questionable cause.  High-sensitivity troponins did not trend upwards.  EKG nonischemic.  Cardiology evaluation appreciated.  Continue aspirin.  Follow 2D echo - Check lipid panel in a.m.  Hypertension -blood pressure currently stable.  Resume home antihypertensive regimen once verified  Paroxysmal A-fib -Not on anticoagulation.  Currently rate controlled  Prediabetes -CBGs with SSI.  Check A1c  Possible UTI: Present on admission; possibly related to urostomy -Continue Rocephin .  Follow the urine cultures  History of bladder cancer status post urostomy - Outpatient follow-up with urology   DVT prophylaxis: Lovenox Code Status: DNR, confirmed by the patient Family Communication: None at bedside Disposition Plan: Home in 1 to 2 days mostly improved Consults called: Cardiology by ED provider Admission status: Observation/telemetry  Severity of Illness: The appropriate patient status for this patient is OBSERVATION. Observation status is judged to be reasonable and necessary in order to provide the required intensity of service to ensure the patient's safety. The patient's presenting symptoms, physical exam findings, and initial radiographic  and laboratory data in the context of their medical condition is felt to place them at decreased risk for further clinical deterioration. Furthermore, it is anticipated that the patient will be medically stable for discharge from the hospital within 2 midnights of admission.     Audria Leather MD [facility]  07/16/2023, 4:05 PM

## 2023-07-16 NOTE — Consult Note (Signed)
   Cardiology Consultation   Patient ID: WILBERN PENNYPACKER MRN: [id number]; DOB: [birth year]  Admit date: 07/16/2023 Date of Consult: 07/16/2023  PCP:  Benedetto Brady, MD   History of Present Illness:   Mr. Mark [name] is a 73 year old man who I am seeing today for an evaluation of chest pain at the request of Dr. Lora Robinsons fan.  The patient has a history of atrial fibrillation not on anticoagulation, bladder cancer post urostomy, CKD, colon cancer in remission, GERD, hypertension, hyperlipidemia.  He woke up this morning around 4:30 AM and began feeling a pressure-like discomfort in the middle of his chest.  It was initially radiating to the jaw.  EMS was called who gave him aspirin and nitroglycerin.  Patient with family today in the emergency department.  He describes the pain as 10 out of 10 in the center of his chest when he woke up.  After the aspirin and nitroglycerin, his pain reduced to a 7 and now it is a 5 out of 10.  He reports diaphoresis when he woke up but this has since resolved.  Some nausea but he has not vomited.  He has chronic pain because of neuropathy and takes pain medications for this.  He has a hiatal hernia that has caused discomfort in the past.  He wonders whether or not some of the discomfort he has today is from his hernia.  He tells me that he had a mild rash after receiving contrast in the past but never had a true anaphylactic reaction.  He reports prior tobacco use but this has been many years ago.  He tells me that his atrial fibrillation was diagnosed many years ago.  His doctor at the time prescribed him an anticoagulant that he believes was Eliquis.  He took it for about 1 year but never had another episode of A-fib so the doctor discontinued the anticoagulant.  He has not had any recent episodes of atrial fibrillation.   Past medical, surgical, social and family history reviewed.  ROS:  Please see the history of present illness.  All other ROS reviewed and negative.      Physical Exam/Data:   Vitals:   07/16/23 1101 07/16/23 1106 07/16/23 1415 07/16/23 1416  BP: (!) 118/55  137/74 136/67  Pulse: 88  70 74  Resp: 15  16 19   Temp:  (!) 97.4 F (36.3 C)  98.3 F (36.8 C)  TempSrc:    Oral  SpO2: 100%  100% 100%  Weight:  75.8 kg    Height:  5\' 6"  (1.676 m)      General:  Well nourished, well developed, in no acute distress Cardiac:  normal S1, S2; RRR; no murmur  Lungs:  clear to auscultation bilaterally, no wheezing, rhonchi or rales  Psych:  Normal affect   EKG:  The EKG was personally reviewed and demonstrates: Sinus rhythm.  Incomplete left bundle branch block. Telemetry:  Telemetry was personally reviewed and demonstrates: Sinus rhythm    Assessment and Plan:   #Chest pain Unclear cause.  ECG is nonischemic.  Troponins not suggestive of ACS.  Given his symptoms and risk profile, favor evaluation with coronary CTA. - Order echo -Continue aspirin daily - CTA coronary - Check lipid panel  #Hypertension Controlled thus far. Continue diltiazem  #Atrial fibrillation history In sinus rhythm upon arrival.  Not on anticoagulation historically.   Donelda Fujita T. Marven Slimmer, MD, [facility], [facility] Cardiac Electrophysiology

## 2023-07-17 ENCOUNTER — Observation Stay (HOSPITAL_COMMUNITY)

## 2023-07-17 DIAGNOSIS — I200 Unstable angina: Secondary | ICD-10-CM | POA: Diagnosis not present

## 2023-07-17 DIAGNOSIS — R0789 Other chest pain: Secondary | ICD-10-CM | POA: Diagnosis not present

## 2023-07-17 DIAGNOSIS — R079 Chest pain, unspecified: Secondary | ICD-10-CM | POA: Diagnosis not present

## 2023-07-17 DIAGNOSIS — I2510 Atherosclerotic heart disease of native coronary artery without angina pectoris: Secondary | ICD-10-CM

## 2023-07-17 DIAGNOSIS — R931 Abnormal findings on diagnostic imaging of heart and coronary circulation: Secondary | ICD-10-CM | POA: Diagnosis not present

## 2023-07-17 LAB — CBC WITH DIFFERENTIAL/PLATELET
Abs Immature Granulocytes: 0.01 10*3/uL (ref 0.00–0.07)
Basophils Absolute: 0 10*3/uL (ref 0.0–0.1)
Basophils Relative: 0 %
Eosinophils Absolute: 0 10*3/uL (ref 0.0–0.5)
Eosinophils Relative: 0 %
HCT: 40.1 % (ref 39.0–52.0)
Hemoglobin: 13.4 g/dL (ref 13.0–17.0)
Immature Granulocytes: 0 %
Lymphocytes Relative: 27 %
Lymphs Abs: 1 10*3/uL (ref 0.7–4.0)
MCH: 28.6 pg (ref 26.0–34.0)
MCHC: 33.4 g/dL (ref 30.0–36.0)
MCV: 85.7 fL (ref 80.0–100.0)
Monocytes Absolute: 0 10*3/uL — ABNORMAL LOW (ref 0.1–1.0)
Monocytes Relative: 1 %
Neutro Abs: 2.7 10*3/uL (ref 1.7–7.7)
Neutrophils Relative %: 72 %
Platelets: 138 10*3/uL — ABNORMAL LOW (ref 150–400)
RBC: 4.68 MIL/uL (ref 4.22–5.81)
RDW: 14 % (ref 11.5–15.5)
WBC: 3.8 10*3/uL — ABNORMAL LOW (ref 4.0–10.5)
nRBC: 0 % (ref 0.0–0.2)

## 2023-07-17 LAB — COMPREHENSIVE METABOLIC PANEL WITH GFR
ALT: 24 U/L (ref 0–44)
AST: 29 U/L (ref 15–41)
Albumin: 3.3 g/dL — ABNORMAL LOW (ref 3.5–5.0)
Alkaline Phosphatase: 88 U/L (ref 38–126)
Anion gap: 11 (ref 5–15)
BUN: 26 mg/dL — ABNORMAL HIGH (ref 8–23)
CO2: 22 mmol/L (ref 22–32)
Calcium: 10.1 mg/dL (ref 8.9–10.3)
Chloride: 103 mmol/L (ref 98–111)
Creatinine, Ser: 1.49 mg/dL — ABNORMAL HIGH (ref 0.61–1.24)
GFR, Estimated: 49 mL/min — ABNORMAL LOW (ref >60)
Glucose, Bld: 157 mg/dL — ABNORMAL HIGH (ref 70–99)
Potassium: 4.6 mmol/L (ref 3.5–5.1)
Sodium: 136 mmol/L (ref 135–145)
Total Bilirubin: 0.9 mg/dL (ref 0.0–1.2)
Total Protein: 7.4 g/dL (ref 6.5–8.1)

## 2023-07-17 LAB — GLUCOSE, CAPILLARY
Glucose-Capillary: 160 mg/dL — ABNORMAL HIGH (ref 70–99)
Glucose-Capillary: 125 mg/dL — ABNORMAL HIGH (ref 70–99)
Glucose-Capillary: 184 mg/dL — ABNORMAL HIGH (ref 70–99)
Glucose-Capillary: 219 mg/dL — ABNORMAL HIGH (ref 70–99)

## 2023-07-17 LAB — HEMOGLOBIN A1C
Hgb A1c MFr Bld: 6.1 % — ABNORMAL HIGH (ref 4.8–5.6)
Mean Plasma Glucose: 128.37 mg/dL

## 2023-07-17 LAB — MAGNESIUM: Magnesium: 1.8 mg/dL (ref 1.7–2.4)

## 2023-07-17 MED ORDER — IOHEXOL 350 MG/ML SOLN
100.0000 mL | Freq: Once | INTRAVENOUS | Status: AC | PRN
  Administered 2023-07-17: 100 mL via INTRAVENOUS

## 2023-07-17 MED ORDER — PREDNISONE 20 MG PO TABS
50.0000 mg | ORAL_TABLET | Freq: Once | ORAL | Status: AC
  Administered 2023-07-17: 50 mg via ORAL
  Filled 2023-07-17: qty 1

## 2023-07-17 MED ORDER — METOPROLOL TARTRATE 50 MG PO TABS
50.0000 mg | ORAL_TABLET | Freq: Once | ORAL | Status: AC
  Administered 2023-07-17: 50 mg via ORAL
  Filled 2023-07-17: qty 1

## 2023-07-17 MED ORDER — AMLODIPINE BESYLATE 5 MG PO TABS
5.0000 mg | ORAL_TABLET | Freq: Every day | ORAL | Status: DC
  Administered 2023-07-17 – 2023-07-21 (×5): 5 mg via ORAL
  Filled 2023-07-17 (×5): qty 1

## 2023-07-17 MED ORDER — PANTOPRAZOLE SODIUM 40 MG PO TBEC
40.0000 mg | DELAYED_RELEASE_TABLET | Freq: Every day | ORAL | Status: DC
  Administered 2023-07-17 – 2023-07-21 (×5): 40 mg via ORAL
  Filled 2023-07-17 (×5): qty 1

## 2023-07-17 MED ORDER — PREDNISONE 20 MG PO TABS: 50.0000 mg | ORAL_TABLET | Freq: Once | ORAL | Status: DC

## 2023-07-17 NOTE — Care Management Obs Status (Signed)
 MEDICARE OBSERVATION STATUS NOTIFICATION   Patient Details  Name: Mark [name] MRN: [id number] Date of Birth: September 28, 1950   Medicare Observation Status Notification Given:  Yes    Cindia Crease, RN 07/17/2023, 3:31 PM

## 2023-07-17 NOTE — Progress Notes (Signed)
 PROGRESS NOTE    Mark [name]  ZOX:[id number] DOB: November 23, 1950 DOA: 07/16/2023 PCP: Benedetto Brady, MD   Brief Narrative:  73 y.o. male with medical history significant of paroxysmal A-fib not on anticoagulation, chronic kidney disease stage IIIa, bladder cancer status post urostomy, colon cancer in remission, GERD, hypertension, hyperlipidemia, prediabetes presented with chest pain.  On presentation, high-sensitivity troponins were 18 and 23. EKG was nonischemic. UA was suggestive of UTI.  He was started on IV antibiotics.  Cardiology was consulted.  Assessment & Plan:  Chest pain - Questionable cause.  High-sensitivity troponins did not trend upwards.  EKG nonischemic.   -Cardiology following.  Coronary CT pending.  Continue aspirin.  Follow 2D echo   Hypertension -blood pressure currently stable.  Cardiology has started amlodipine.  Paroxysmal A-fib - Remote history.  Not on anticoagulation currently.  Currently rate controlled   Prediabetes -CBGs with SSI.  Check A1c   Possible UTI: Present on admission; possibly related to urostomy -Continue Rocephin .  Urine cultures growing gram-negative rods.  Follow identification and sensitivities.  History of bladder cancer status post urostomy - Outpatient follow-up with urology     DVT prophylaxis: Lovenox Code Status: DNR Family Communication: None at bedside Disposition Plan: Status is: Observation Will possibly need to change to inpatient for continuation of IV antibiotics.  Consultants: Cardiology  Procedures: None  Antimicrobials: Rocephin  from 07/16/2023 onwards   Subjective: Patient seen and examined at bedside.  Feels slightly better but speech is still intermittently stuttering.  Denies any current chest pain.  No fever or vomiting reported.  Objective: Vitals:   07/16/23 2015 07/16/23 2351 07/17/23 0540 07/17/23 0821  BP: (!) 146/82 123/64 138/86 (!) 149/69  Pulse: 65 76 66 74  Resp: 18 16 18 16   Temp: 97.7 F  (36.5 C) 97.7 F (36.5 C) 97.8 F (36.6 C) 98.2 F (36.8 C)  TempSrc: Oral Oral Axillary Oral  SpO2: 98% 97% 97% 100%  Weight:      Height:        Intake/Output Summary (Last 24 hours) at 07/17/2023 1025 Last data filed at 07/17/2023 0600 Gross per 24 hour  Intake 367.85 ml  Output 401 ml  Net -33.15 ml   Filed Weights   07/16/23 1059 07/16/23 1106 07/16/23 1714  Weight: 74.8 kg 75.8 kg 76.5 kg    Examination:  General exam: Appears calm and comfortable.  No distress. Respiratory system: Bilateral decreased breath sounds at bases Cardiovascular system: S1 & S2 heard, Rate controlled currently Gastrointestinal system: Abdomen is nondistended, soft and nontender. Normal bowel sounds heard.  Urostomy in place Extremities: No cyanosis, clubbing, edema  Central nervous system: Alert and oriented. No focal neurological deficits. Moving extremities Skin: No rashes, lesions or ulcers Psychiatry: Judgement and insight appear normal. Mood & affect appropriate.     Data Reviewed: I have personally reviewed following labs and imaging studies  CBC: Recent Labs  Lab 07/16/23 1143 07/17/23 0446  WBC 6.7 3.8*  NEUTROABS  --  2.7  HGB 13.7 13.4  HCT 41.1 40.1  MCV 87.1 85.7  PLT 160 138*   Basic Metabolic Panel: Recent Labs  Lab 07/16/23 1345 07/17/23 0446  NA 140 136  K 4.1 4.6  CL 104 103  CO2 24 22  GLUCOSE 112* 157*  BUN 24* 26*  CREATININE 1.45* 1.49*  CALCIUM 10.6* 10.1  MG  --  1.8   GFR: Estimated Creatinine Clearance: 42.2 mL/min (A) (by C-G formula based on SCr of 1.49 mg/dL (H)).  Liver Function Tests: Recent Labs  Lab 07/17/23 0446  AST 29  ALT 24  ALKPHOS 88  BILITOT 0.9  PROT 7.4  ALBUMIN 3.3*   No results for input(s): "LIPASE", "AMYLASE" in the last 168 hours. No results for input(s): "AMMONIA" in the last 168 hours. Coagulation Profile: No results for input(s): "INR", "PROTIME" in the last 168 hours. Cardiac Enzymes: No results for  input(s): "CKTOTAL", "CKMB", "CKMBINDEX", "TROPONINI" in the last 168 hours. BNP (last 3 results) No results for input(s): "PROBNP" in the last 8760 hours. HbA1C: Recent Labs    07/17/23 0446  HGBA1C 6.1*   CBG: Recent Labs  Lab 07/16/23 1728 07/16/23 2121 07/17/23 0827  GLUCAP 121* 169* 160*   Lipid Profile: No results for input(s): "CHOL", "HDL", "LDLCALC", "TRIG", "CHOLHDL", "LDLDIRECT" in the last 72 hours. Thyroid Function Tests: No results for input(s): "TSH", "T4TOTAL", "FREET4", "T3FREE", "THYROIDAB" in the last 72 hours. Anemia Panel: No results for input(s): "VITAMINB12", "FOLATE", "FERRITIN", "TIBC", "IRON", "RETICCTPCT" in the last 72 hours. Sepsis Labs: No results for input(s): "PROCALCITON", "LATICACIDVEN" in the last 168 hours.  Recent Results (from the past 240 hours)  Urine Culture     Status: Abnormal (Preliminary result)   Collection Time: 07/16/23  1:25 PM   Specimen: Urine, Random  Result Value Ref Range Status   Specimen Description URINE, RANDOM  Final   Special Requests NONE Reflexed from [id number]  Final   Culture (A)  Final    >=100,000 COLONIES/mL GRAM NEGATIVE RODS IDENTIFICATION TO FOLLOW CULTURE REINCUBATED FOR BETTER GROWTH Performed at [facility], [address] [place], [place] [address]    Report Status PENDING  Incomplete         Radiology Studies: DG Chest 2 View Result Date: 07/16/2023 CLINICAL DATA:  Chest pain EXAM: CHEST - 2 VIEW COMPARISON:  Chest x-ray performed September 04, 2018 FINDINGS: The heart size and mediastinal contours are within normal limits. Both lungs are clear. The visualized skeletal structures are unremarkable. IMPRESSION: No active cardiopulmonary disease. Electronically Signed   By: Reagan Camera M.D.   On: 07/16/2023 12:34        Scheduled Meds:  amLODipine  5 mg Oral Daily   aspirin EC  81 mg Oral Daily   DULoxetine   30 mg Oral Daily   enoxaparin (LOVENOX) injection  40 mg Subcutaneous Q24H    insulin aspart  0-15 Units Subcutaneous TID WC   insulin aspart  0-5 Units Subcutaneous QHS   pantoprazole  (PROTONIX ) IV  40 mg Intravenous Q24H   Continuous Infusions:  cefTRIAXone  (ROCEPHIN )  IV 1 g (07/17/23 0844)          Audria Leather, MD [facility] 07/17/2023, 10:25 AM

## 2023-07-17 NOTE — Progress Notes (Signed)
   Rounding Note    Patient Name: Mark [name] Date of Encounter: 07/17/2023  [facility] Cardiologist: None   Subjective   No chest pain this morning  Vital Signs    Vitals:   07/16/23 2015 07/16/23 2351 07/17/23 0540 07/17/23 0821  BP: (!) 146/82 123/64 138/86 (!) 149/69  Pulse: 65 76 66 74  Resp: 18 16 18 16   Temp: 97.7 F (36.5 C) 97.7 F (36.5 C) 97.8 F (36.6 C) 98.2 F (36.8 C)  TempSrc: Oral Oral Axillary Oral  SpO2: 98% 97% 97% 100%  Weight:      Height:        Intake/Output Summary (Last 24 hours) at 07/17/2023 0842 Last data filed at 07/17/2023 0600 Gross per 24 hour  Intake 367.85 ml  Output 401 ml  Net -33.15 ml      07/16/2023    5:14 PM 07/16/2023   11:06 AM 07/16/2023   10:59 AM  Last 3 Weights  Weight (lbs) 168 lb 11.2 oz 167 lb 165 lb  Weight (kg) 76.522 kg 75.751 kg 74.844 kg       Physical Exam    GEN: No acute distress.   Cardiac: Warm extremities, well-perfused  Psych: Normal affect   Assessment & Plan    #Chest pain Resolved this morning.  Atypical sounding.  Troponins and ECG not suggestive of an acute coronary syndrome. -Coronary CTA pending -Continue aspirin -Echo pending  #Remote atrial fibrillation history In sinus rhythm Not on anticoagulation  #Hypertension Add amlodipine 5  Zandon Talton T. Marven Slimmer, MD, [facility], [facility] Cardiac Electrophysiology

## 2023-07-18 ENCOUNTER — Observation Stay (HOSPITAL_COMMUNITY)

## 2023-07-18 DIAGNOSIS — I200 Unstable angina: Secondary | ICD-10-CM | POA: Diagnosis not present

## 2023-07-18 DIAGNOSIS — R079 Chest pain, unspecified: Secondary | ICD-10-CM | POA: Diagnosis not present

## 2023-07-18 DIAGNOSIS — I10 Essential (primary) hypertension: Secondary | ICD-10-CM | POA: Diagnosis not present

## 2023-07-18 LAB — BASIC METABOLIC PANEL WITH GFR
Anion gap: 10 (ref 5–15)
BUN: 36 mg/dL — ABNORMAL HIGH (ref 8–23)
CO2: 20 mmol/L — ABNORMAL LOW (ref 22–32)
Calcium: 9 mg/dL (ref 8.9–10.3)
Chloride: 103 mmol/L (ref 98–111)
Creatinine, Ser: 1.67 mg/dL — ABNORMAL HIGH (ref 0.61–1.24)
GFR, Estimated: 43 mL/min — ABNORMAL LOW (ref >60)
Glucose, Bld: 135 mg/dL — ABNORMAL HIGH (ref 70–99)
Potassium: 4.2 mmol/L (ref 3.5–5.1)
Sodium: 133 mmol/L — ABNORMAL LOW (ref 135–145)

## 2023-07-18 LAB — CBC WITH DIFFERENTIAL/PLATELET
Abs Immature Granulocytes: 0.04 10*3/uL (ref 0.00–0.07)
Basophils Absolute: 0 10*3/uL (ref 0.0–0.1)
Basophils Relative: 0 %
Eosinophils Absolute: 0 10*3/uL (ref 0.0–0.5)
Eosinophils Relative: 0 %
HCT: 39.8 % (ref 39.0–52.0)
Hemoglobin: 13.1 g/dL (ref 13.0–17.0)
Immature Granulocytes: 0 %
Lymphocytes Relative: 14 %
Lymphs Abs: 1.4 10*3/uL (ref 0.7–4.0)
MCH: 28.7 pg (ref 26.0–34.0)
MCHC: 32.9 g/dL (ref 30.0–36.0)
MCV: 87.1 fL (ref 80.0–100.0)
Monocytes Absolute: 0.5 10*3/uL (ref 0.1–1.0)
Monocytes Relative: 5 %
Neutro Abs: 7.8 10*3/uL — ABNORMAL HIGH (ref 1.7–7.7)
Neutrophils Relative %: 81 %
Platelets: 137 10*3/uL — ABNORMAL LOW (ref 150–400)
RBC: 4.57 MIL/uL (ref 4.22–5.81)
RDW: 13.9 % (ref 11.5–15.5)
WBC: 9.8 10*3/uL (ref 4.0–10.5)
nRBC: 0 % (ref 0.0–0.2)

## 2023-07-18 LAB — ECHOCARDIOGRAM COMPLETE
AR max vel: 3.13 cm2
AV Area VTI: 2.9 cm2
AV Area mean vel: 2.75 cm2
AV Mean grad: 3 mmHg
AV Peak grad: 5.3 mmHg
Ao pk vel: 1.15 m/s
Area-P 1/2: 4.49 cm2
Calc EF: 55.2 %
Height: 65 in
S' Lateral: 3.1 cm
Single Plane A2C EF: 54 %
Single Plane A4C EF: 59 %
Weight: 2668.8 [oz_av]

## 2023-07-18 LAB — GLUCOSE, CAPILLARY
Glucose-Capillary: 117 mg/dL — ABNORMAL HIGH (ref 70–99)
Glucose-Capillary: 197 mg/dL — ABNORMAL HIGH (ref 70–99)
Glucose-Capillary: 90 mg/dL (ref 70–99)
Glucose-Capillary: 136 mg/dL — ABNORMAL HIGH (ref 70–99)

## 2023-07-18 LAB — MAGNESIUM: Magnesium: 2 mg/dL (ref 1.7–2.4)

## 2023-07-18 MED ORDER — ROSUVASTATIN CALCIUM 20 MG PO TABS
20.0000 mg | ORAL_TABLET | Freq: Every day | ORAL | Status: DC
  Administered 2023-07-18 – 2023-07-20 (×3): 20 mg via ORAL
  Filled 2023-07-18 (×3): qty 1

## 2023-07-18 MED ORDER — SODIUM CHLORIDE 0.9 % WEIGHT BASED INFUSION
1.0000 mL/kg/h | INTRAVENOUS | Status: DC
Start: 2023-07-19 — End: 2023-07-19
  Administered 2023-07-19: 1 mL/kg/h via INTRAVENOUS

## 2023-07-18 MED ORDER — SODIUM CHLORIDE 0.9 % WEIGHT BASED INFUSION
3.0000 mL/kg/h | INTRAVENOUS | Status: DC
  Administered 2023-07-19: 3 mL/kg/h via INTRAVENOUS

## 2023-07-18 MED ORDER — CARVEDILOL 6.25 MG PO TABS
6.2500 mg | ORAL_TABLET | Freq: Two times a day (BID) | ORAL | Status: DC
  Administered 2023-07-18 – 2023-07-21 (×8): 6.25 mg via ORAL
  Filled 2023-07-18 (×8): qty 1

## 2023-07-18 NOTE — H&P (View-Only) (Signed)
 Progress Note  Patient Name: Mark [name] Date of Encounter: 07/18/2023 [facility] Cardiologist: None   Interval Summary   Patient feels generally well this morning, reporting mild chest pressure. Discussed CT findings and plan for [facility] tomorrow.   Vital Signs Vitals:   07/17/23 1943 07/17/23 2323 07/18/23 0521 07/18/23 0839  BP: 123/70 119/76 132/70 (!) 147/92  Pulse: 85 77 74 71  Resp: 19 18 16 17   Temp: 98.4 F (36.9 C) 98.4 F (36.9 C) 97.9 F (36.6 C) 98.1 F (36.7 C)  TempSrc: Oral Oral Oral Oral  SpO2: 98% 98% 98% 100%  Weight:   75.7 kg   Height:        Intake/Output Summary (Last 24 hours) at 07/18/2023 0933 Last data filed at 07/17/2023 1700 Gross per 24 hour  Intake 100 ml  Output --  Net 100 ml      07/18/2023    5:21 AM 07/16/2023    5:14 PM 07/16/2023   11:06 AM  Last 3 Weights  Weight (lbs) 166 lb 12.8 oz 168 lb 11.2 oz 167 lb  Weight (kg) 75.66 kg 76.522 kg 75.751 kg      Telemetry/ECG  Sinus rhythm - Personally Reviewed  Physical Exam  GEN: No acute distress.   Neck: No JVD Cardiac: RRR, no murmurs, rubs, or gallops.  Respiratory: Clear to auscultation bilaterally. GI: Soft, nontender, non-distended  MS: No edema  Assessment & Plan   Accelerating angina Patient with recent symptoms of chest pressure radiating to jaw and arms. Pain was nitroglycerin responsive. Of note, does also have hiatal hernia. Troponin and ECG not suggestive of ACS but symptoms are certainly concerning. CTA chest with score of 891. Moderate 3 vessel CAD. FFR pending. Symptoms combined with CTA findings concerning for obstructive CAD. Patient will need LHC. Creatinine up from baseline today and given contrast load for CT yesterday, will give him a day for recovery/rehydration. LHC planned for tomorrow.  Add low dose Coreg today given ongoing mild chest pressure. Echo pending Continue ASA 81mg  Start Crestor 20mg   Informed Consent   Shared Decision  Making/Informed Consent The risks [stroke (1 in 1000), death (1 in 1000), kidney failure [usually temporary] (1 in 500), bleeding (1 in 200), allergic reaction [possibly serious] (1 in 200)], benefits (diagnostic support and management of coronary artery disease) and alternatives of a cardiac catheterization were discussed in detail with Mark [name] and he is willing to proceed.      Hypertension Amlodipine 5mg  added yesterday, BP marginally elevated today. Continue Amlodipine 5mg  Add Coreg 6.25mg  BID   Hx atrial fibrillation Remote history of this per patient, has not been on OAC with lack of recurrent episodes.  Monitor inpatient telemetry     For questions or updates, please contact  [facility] Please consult [email or web address] for contact info under       Signed, Leala Prince, PA-C   Patient seen and examined and history reviewed. Agree with above findings and plan. [name] with history of colon CA and bladder CA in remission. Presented with classic symptoms of unstable angina- midsternal severe CP radiating to jaws and arms. Currently pain free. No significant troponin elevation. Coronary CTA reviewed. Moderate 3 vessel CAD with high calcium score. Given these findings I think invasive evaluation with cardiac cath is warranted. Given CKD with contrast study yesterday and higher creatinine today will tentatively plan for tomorrow with IV hydration provided creatinine tomorrow is stable/improved. Will adjust antihypertensive therapy. Echo pending  Donata Fryer  [place], MDFACC 07/18/2023 9:59 AM

## 2023-07-18 NOTE — Progress Notes (Signed)
 PROGRESS NOTE    Mark [name]  WGN:[id number] DOB: 17-Feb-1950 DOA: 07/16/2023 PCP: Benedetto Brady, MD   Brief Narrative:  73 y.o. male with medical history significant of paroxysmal A-fib not on anticoagulation, chronic kidney disease stage IIIa, bladder cancer status post urostomy, colon cancer in remission, GERD, hypertension, hyperlipidemia, prediabetes presented with chest pain.  On presentation, high-sensitivity troponins were 18 and 23. EKG was nonischemic. UA was suggestive of UTI.  He was started on IV antibiotics.  Cardiology was consulted.  Assessment & Plan:  Chest pain - Questionable cause.  High-sensitivity troponins did not trend upwards.  EKG nonischemic.   -Cardiology following.  Coronary CT showed a coronary calcium score of 891 with other abnormalities.  Continue aspirin.  2D echo still pending   Hypertension -blood pressure currently stable.  Cardiology has started amlodipine.  Paroxysmal A-fib - Remote history.  Not on anticoagulation currently.  Currently rate controlled   Prediabetes -CBGs with SSI.  A1c 6.1.  Possible UTI: Present on admission; possibly related to urostomy -Continue Rocephin .  Urine cultures growing [place].  Follow sensitivities.  History of bladder cancer status post urostomy - Outpatient follow-up with urology     DVT prophylaxis: Lovenox Code Status: DNR Family Communication: None at bedside Disposition Plan: Status is: Observation Will possibly need to change to inpatient for continuation of IV antibiotics.  Consultants: Cardiology  Procedures: None  Antimicrobials: Rocephin  from 07/16/2023 onwards   Subjective: Patient seen and examined at bedside.  Still having intermittent chest pressure.  Speech is improving.  Having some mild headache.  No fever or vomiting reported. Objective: Vitals:   07/17/23 1943 07/17/23 2323 07/18/23 0521 07/18/23 0839  BP: 123/70 119/76 132/70 (!) 147/92  Pulse: 85 77 74 71  Resp: 19 18 16  17   Temp: 98.4 F (36.9 C) 98.4 F (36.9 C) 97.9 F (36.6 C) 98.1 F (36.7 C)  TempSrc: Oral Oral Oral Oral  SpO2: 98% 98% 98% 100%  Weight:   75.7 kg   Height:        Intake/Output Summary (Last 24 hours) at 07/18/2023 0939 Last data filed at 07/17/2023 1700 Gross per 24 hour  Intake 100 ml  Output --  Net 100 ml   Filed Weights   07/16/23 1106 07/16/23 1714 07/18/23 0521  Weight: 75.8 kg 76.5 kg 75.7 kg    Examination:  General: On room air.  No acute distress ENT/neck: No thyromegaly.  JVD is not elevated  respiratory: Decreased breath sounds at bases bilaterally with some crackles; no wheezing CVS: S1-S2 heard, rate mostly controlled Abdominal: Soft, nontender, slightly distended; no organomegaly, bowel sounds are heard.  Urostomy present Extremities: Trace lower extremity edema; no cyanosis  CNS: Awake and alert.  No focal neurologic deficit.  Moves extremities Lymph: No obvious lymphadenopathy Skin: No obvious ecchymosis/lesions  psych: Affect, judgment and mood are normal  musculoskeletal: No obvious joint swelling/deformity     Data Reviewed: I have personally reviewed following labs and imaging studies  CBC: Recent Labs  Lab 07/16/23 1143 07/17/23 0446 07/18/23 0519  WBC 6.7 3.8* 9.8  NEUTROABS  --  2.7 7.8*  HGB 13.7 13.4 13.1  HCT 41.1 40.1 39.8  MCV 87.1 85.7 87.1  PLT 160 138* 137*   Basic Metabolic Panel: Recent Labs  Lab 07/16/23 1345 07/17/23 0446 07/18/23 0519  NA 140 136 133*  K 4.1 4.6 4.2  CL 104 103 103  CO2 24 22 20*  GLUCOSE 112* 157* 135*  BUN 24* 26*  36*  CREATININE 1.45* 1.49* 1.67*  CALCIUM 10.6* 10.1 9.0  MG  --  1.8 2.0   GFR: Estimated Creatinine Clearance: 37.4 mL/min (A) (by C-G formula based on SCr of 1.67 mg/dL (H)). Liver Function Tests: Recent Labs  Lab 07/17/23 0446  AST 29  ALT 24  ALKPHOS 88  BILITOT 0.9  PROT 7.4  ALBUMIN 3.3*   No results for input(s): "LIPASE", "AMYLASE" in the last 168  hours. No results for input(s): "AMMONIA" in the last 168 hours. Coagulation Profile: No results for input(s): "INR", "PROTIME" in the last 168 hours. Cardiac Enzymes: No results for input(s): "CKTOTAL", "CKMB", "CKMBINDEX", "TROPONINI" in the last 168 hours. BNP (last 3 results) No results for input(s): "PROBNP" in the last 8760 hours. HbA1C: Recent Labs    07/17/23 0446  HGBA1C 6.1*   CBG: Recent Labs  Lab 07/17/23 0827 07/17/23 1137 07/17/23 1657 07/17/23 2133 07/18/23 0837  GLUCAP 160* 125* 184* 219* 117*   Lipid Profile: No results for input(s): "CHOL", "HDL", "LDLCALC", "TRIG", "CHOLHDL", "LDLDIRECT" in the last 72 hours. Thyroid Function Tests: No results for input(s): "TSH", "T4TOTAL", "FREET4", "T3FREE", "THYROIDAB" in the last 72 hours. Anemia Panel: No results for input(s): "VITAMINB12", "FOLATE", "FERRITIN", "TIBC", "IRON", "RETICCTPCT" in the last 72 hours. Sepsis Labs: No results for input(s): "PROCALCITON", "LATICACIDVEN" in the last 168 hours.  Recent Results (from the past 240 hours)  Urine Culture     Status: Abnormal (Preliminary result)   Collection Time: 07/16/23  1:25 PM   Specimen: Urine, Random  Result Value Ref Range Status   Specimen Description URINE, RANDOM  Final   Special Requests NONE Reflexed from [id number]  Final   Culture (A)  Final    >=100,000 COLONIES/mL [facility] STUARTII 60,000 COLONIES/mL [facility] RETTGERI CULTURE REINCUBATED FOR BETTER GROWTH SUSCEPTIBILITIES TO FOLLOW Performed at [facility], [address] [place], [place] [address]    Report Status PENDING  Incomplete         Radiology Studies: CT CORONARY MORPH W/CTA COR W/SCORE W/CA W/CM &/OR WO/CM Addendum Date: 07/17/2023 ADDENDUM REPORT: 07/17/2023 19:02 HISTORY: Chest pain/anginal equiv, ECGs or troponins abnormal EXAM: Cardiac/Coronary  CT TECHNIQUE: The patient was scanned on a [employer]. PROTOCOL: A non-contrast, gated CT scan was  obtained with axial slices of 3 mm through the heart for calcium scoring. Calcium scoring was performed using the Agatston method. A 120 kV prospective, gated, contrast cardiac scan was obtained. Gantry rotation speed was 250 msecs and collimation was 0.6 mm. Two sublingual nitroglycerin tablets (0.8 mg) were given. The 3 D data set was reconstructed in 5% intervals of the 35-75% of the R-R cycle. Diastolic phases were analyzed on a dedicated workstation using MPR, MIP, and VRT modes. The patient received 95 cc of contrast. FINDINGS: Image quality: Average. Artifact: Limited-blooming artifact Coronary artery calcification score: Left main: 11 Left anterior descending artery: 134 Left circumflex artery: 204 Right coronary artery: 542 Total coronary calcium score of 891, places the patient at the 79th percentile for age and sex matched control. Coronary arteries: Normal coronary origins.  Right dominance. Left Main Coronary Artery: Normal caliber vessel, originates from the left coronary cusp, bifurcates to form a left anterior descending artery (LAD) and a left circumflex artery (LCX). Mild stenosis (25-49%) due to mixed plaque at distal left main. Left Anterior Descending Artery: Normal caliber vessel, wraps the apex, gives off 3 diagonal branches. Minimal luminal irregularities (<25%) due to mixed plaque at the ostial segment. Moderate stenosis (  50-69%) due to calcified plaque at proximal LAD, minimal irregularities due to calcified plaque in mid LAD otherwise patent. First Diagonal branch: Small caliber vessel patent Second Diagonal branch: Normal caliber vessel, patent Third Diagonal branch: Small caliber, patent Left Circumflex Artery: Normal caliber vessel, non-dominant, travels within the atrioventricular groove, terminates as the third obtuse marginal branch. Moderate stenosis (50-69%) due to calcified plaque at the proximal to mid LCx and mid to distal segments are patent. First Obtuse Marginal branch: Small  caliber, patent Second Obtuse Marginal branch: Mild stenosis (25-49%) due to calcified plaque at ostial to proximal segment, otherwise patent Third Obtuse Marginal branch: Patent Right Coronary Artery: Dominant vessel, originates from the right coronary cusp, terminates as a PDA and right posterolateral branch. Moderate stenosis (50-69%) due to calcified plaque at proximal RCA. Diffuse disease due to mixed plaque within the proximal to distal segments. PDA is patent. Moderate stenosis (50-69%) closer to 50% due to mixed plaque at the ostial PLB branch otherwise mid/distal segment is patent. Left Atrium: Grossly normal in size with no left atrial appendage filling defect. Left Ventricle: Grossly normal in size. There is no abnormal filling defect. Pulmonary arteries: Normal in size without proximal filling defect. Pulmonary veins: Normal pulmonary venous drainage. Aorta: Normal size, 33.5 mm at the mid ascending aorta (level of the PA bifurcation) measured double oblique. Aortic atherosclerosis. No dissection. Pericardium: Normal thickness with no significant effusion or calcium present. Aortic Valve:Trileaflet without significant calcification. Mitral Valve: Normal structure without significant calcification. Extra-cardiac findings: See attached radiology report for non-cardiac structures. IMPRESSION: 1. Coronary calcium score of 891. This was 79th percentile for age-, sex, and race-matched controls. 2. Study submitted to [facility] but plaque analysis data is unavailable at the time of report completion. 3. Normal coronary origin with right dominance. 4. Aortic atherosclerosis. 5. CAD-RADS = 3 Moderate coronary artery disease. 6. Mild stenosis (25-49%) due to mixed plaque at distal left main. 7. Moderate stenosis (50-69%) due to calcified plaque at proximal LAD. 8. Moderate stenosis (50-69%) due to calcified plaque at the proximal to mid LCx. 9. Moderate stenosis (50-69%) due to calcified plaque at proximal RCA.  10. Moderate stenosis (50-69%) closer to 50% due to mixed plaque at the ostial PLB branch otherwise distal segment is patent. 11. Study is sent for CT-FFR to further evaluate LAD, LCx, and RCA /PLB. Of note disease severity based on CCTA may be overestimated due to calcified plaque causing blooming artifact. CT-FFR findings will be performed and reported separately. Study has been submitted to [facility] but processing is pending. RECOMMENDATIONS: Consider symptom-guided anti-ischemic pharmacotherapy as well as risk factor modification per guideline directed care. Additional analysis with CT FFR will be submitted. Electronically Signed   By: Olinda Bertrand   On: 07/17/2023 19:02   Result Date: 07/17/2023 EXAM: OVER-READ INTERPRETATION  CT CHEST The following report is a limited chest CT over-read performed by radiologist Dr. Nicanor Barge of [facility] on 07/17/2023. This over-read does not include interpretation of cardiac or coronary anatomy or pathology. The coronary CTA interpretation by the cardiologist is attached. COMPARISON:  None Available. FINDINGS: No suspicious nodules, masses, or infiltrates are identified in the visualized portion of the lungs. No pleural fluid seen. The visualized portions of the mediastinum and hilar regions are unremarkable. IMPRESSION: No significant non-cardiac abnormality. Electronically Signed: By: Marlyce Sine M.D. On: 07/17/2023 10:37   DG Chest 2 View Result Date: 07/16/2023 CLINICAL DATA:  Chest pain EXAM: CHEST - 2 VIEW COMPARISON:  Chest x-ray performed September 04, 2018 FINDINGS: The heart size and mediastinal contours are within normal limits. Both lungs are clear. The visualized skeletal structures are unremarkable. IMPRESSION: No active cardiopulmonary disease. Electronically Signed   By: Reagan Camera M.D.   On: 07/16/2023 12:34        Scheduled Meds:  amLODipine  5 mg Oral Daily   aspirin EC  81 mg Oral Daily   DULoxetine   30 mg Oral Daily    enoxaparin (LOVENOX) injection  40 mg Subcutaneous Q24H   insulin aspart  0-15 Units Subcutaneous TID WC   insulin aspart  0-5 Units Subcutaneous QHS   pantoprazole   40 mg Oral Daily   rosuvastatin  20 mg Oral Daily   Continuous Infusions:  cefTRIAXone  (ROCEPHIN )  IV 1 g (07/18/23 0842)          Audria Leather, MD [facility] 07/18/2023, 9:39 AM

## 2023-07-18 NOTE — Interval H&P Note (Signed)
 History and Physical Interval Note:  07/18/2023 6:33 PM  Mark [name]  has presented today for surgery, with the diagnosis of cad.  The various methods of treatment have been discussed with the patient and family. After consideration of risks, benefits and other options for treatment, the patient has consented to  Procedure(s): LEFT HEART CATH AND CORONARY ANGIOGRAPHY (N/A) as a surgical intervention.  The patient's history has been reviewed, patient examined, no change in status, stable for surgery.  I have reviewed the patient's chart and labs.  Questions were answered to the patient's satisfaction.     Lacey Dotson K Odyn Turko

## 2023-07-18 NOTE — Progress Notes (Signed)
*  PRELIMINARY RESULTS* Echocardiogram 2D Echocardiogram has been performed.  Mark [name] 07/18/2023, 10:27 AM

## 2023-07-18 NOTE — Plan of Care (Signed)

## 2023-07-18 NOTE — Progress Notes (Addendum)
 Progress Note  Patient Name: Mark [name] Date of Encounter: 07/18/2023 [facility] Cardiologist: None   Interval Summary   Patient feels generally well this morning, reporting mild chest pressure. Discussed CT findings and plan for [facility] tomorrow.   Vital Signs Vitals:   07/17/23 1943 07/17/23 2323 07/18/23 0521 07/18/23 0839  BP: 123/70 119/76 132/70 (!) 147/92  Pulse: 85 77 74 71  Resp: 19 18 16 17   Temp: 98.4 F (36.9 C) 98.4 F (36.9 C) 97.9 F (36.6 C) 98.1 F (36.7 C)  TempSrc: Oral Oral Oral Oral  SpO2: 98% 98% 98% 100%  Weight:   75.7 kg   Height:        Intake/Output Summary (Last 24 hours) at 07/18/2023 0933 Last data filed at 07/17/2023 1700 Gross per 24 hour  Intake 100 ml  Output --  Net 100 ml      07/18/2023    5:21 AM 07/16/2023    5:14 PM 07/16/2023   11:06 AM  Last 3 Weights  Weight (lbs) 166 lb 12.8 oz 168 lb 11.2 oz 167 lb  Weight (kg) 75.66 kg 76.522 kg 75.751 kg      Telemetry/ECG  Sinus rhythm - Personally Reviewed  Physical Exam  GEN: No acute distress.   Neck: No JVD Cardiac: RRR, no murmurs, rubs, or gallops.  Respiratory: Clear to auscultation bilaterally. GI: Soft, nontender, non-distended  MS: No edema  Assessment & Plan   Accelerating angina Patient with recent symptoms of chest pressure radiating to jaw and arms. Pain was nitroglycerin responsive. Of note, does also have hiatal hernia. Troponin and ECG not suggestive of ACS but symptoms are certainly concerning. CTA chest with score of 891. Moderate 3 vessel CAD. FFR pending. Symptoms combined with CTA findings concerning for obstructive CAD. Patient will need LHC. Creatinine up from baseline today and given contrast load for CT yesterday, will give him a day for recovery/rehydration. LHC planned for tomorrow.  Add low dose Coreg today given ongoing mild chest pressure. Echo pending Continue ASA 81mg  Start Crestor 20mg   Informed Consent   Shared Decision  Making/Informed Consent The risks [stroke (1 in 1000), death (1 in 1000), kidney failure [usually temporary] (1 in 500), bleeding (1 in 200), allergic reaction [possibly serious] (1 in 200)], benefits (diagnostic support and management of coronary artery disease) and alternatives of a cardiac catheterization were discussed in detail with Mark [name] and he is willing to proceed.      Hypertension Amlodipine 5mg  added yesterday, BP marginally elevated today. Continue Amlodipine 5mg  Add Coreg 6.25mg  BID   Hx atrial fibrillation Remote history of this per patient, has not been on OAC with lack of recurrent episodes.  Monitor inpatient telemetry     For questions or updates, please contact [facility] Please consult [email or web address] for contact info under       Signed, Leala Prince, PA-C   Patient seen and examined and history reviewed. Agree with above findings and plan. [name] with history of colon CA and bladder CA in remission. Presented with classic symptoms of unstable angina- midsternal severe CP radiating to jaws and arms. Currently pain free. No significant troponin elevation. Coronary CTA reviewed. Moderate 3 vessel CAD with high calcium score. Given these findings I think invasive evaluation with cardiac cath is warranted. Given CKD with contrast study yesterday and higher creatinine today will tentatively plan for tomorrow with IV hydration provided creatinine tomorrow is stable/improved. Will adjust antihypertensive therapy. Echo pending  Donata Fryer  [place], MDFACC 07/18/2023 9:59 AM

## 2023-07-19 ENCOUNTER — Inpatient Hospital Stay (HOSPITAL_COMMUNITY)
Admission: EM | Disposition: A | Discharge status: Home Health | Payer: Self-pay | Source: Home / Self Care | Attending: Thoracic Surgery (Cardiothoracic Vascular Surgery)

## 2023-07-19 ENCOUNTER — Encounter (HOSPITAL_COMMUNITY): Payer: Self-pay | Admitting: Internal Medicine

## 2023-07-19 ENCOUNTER — Encounter (HOSPITAL_COMMUNITY)
Admission: EM | Disposition: A | Discharge status: Home Health | Payer: Self-pay | Source: Home / Self Care | Attending: Thoracic Surgery (Cardiothoracic Vascular Surgery)

## 2023-07-19 DIAGNOSIS — G620 Drug-induced polyneuropathy: Secondary | ICD-10-CM | POA: Diagnosis present

## 2023-07-19 DIAGNOSIS — N189 Chronic kidney disease, unspecified: Secondary | ICD-10-CM | POA: Diagnosis not present

## 2023-07-19 DIAGNOSIS — Z7984 Long term (current) use of oral hypoglycemic drugs: Secondary | ICD-10-CM | POA: Diagnosis not present

## 2023-07-19 DIAGNOSIS — I440 Atrioventricular block, first degree: Secondary | ICD-10-CM | POA: Diagnosis not present

## 2023-07-19 DIAGNOSIS — I4891 Unspecified atrial fibrillation: Secondary | ICD-10-CM | POA: Diagnosis not present

## 2023-07-19 DIAGNOSIS — I9581 Postprocedural hypotension: Secondary | ICD-10-CM | POA: Diagnosis not present

## 2023-07-19 DIAGNOSIS — T451X5S Adverse effect of antineoplastic and immunosuppressive drugs, sequela: Secondary | ICD-10-CM | POA: Diagnosis not present

## 2023-07-19 DIAGNOSIS — I25110 Atherosclerotic heart disease of native coronary artery with unstable angina pectoris: Secondary | ICD-10-CM | POA: Diagnosis not present

## 2023-07-19 DIAGNOSIS — E785 Hyperlipidemia, unspecified: Secondary | ICD-10-CM | POA: Diagnosis present

## 2023-07-19 DIAGNOSIS — D6959 Other secondary thrombocytopenia: Secondary | ICD-10-CM | POA: Diagnosis not present

## 2023-07-19 DIAGNOSIS — Z8551 Personal history of malignant neoplasm of bladder: Secondary | ICD-10-CM | POA: Diagnosis not present

## 2023-07-19 DIAGNOSIS — D62 Acute posthemorrhagic anemia: Secondary | ICD-10-CM | POA: Diagnosis not present

## 2023-07-19 DIAGNOSIS — N183 Chronic kidney disease, stage 3 unspecified: Secondary | ICD-10-CM | POA: Diagnosis not present

## 2023-07-19 DIAGNOSIS — T451X5A Adverse effect of antineoplastic and immunosuppressive drugs, initial encounter: Secondary | ICD-10-CM | POA: Diagnosis not present

## 2023-07-19 DIAGNOSIS — I2510 Atherosclerotic heart disease of native coronary artery without angina pectoris: Secondary | ICD-10-CM

## 2023-07-19 DIAGNOSIS — N1831 Chronic kidney disease, stage 3a: Secondary | ICD-10-CM | POA: Diagnosis present

## 2023-07-19 DIAGNOSIS — I214 Non-ST elevation (NSTEMI) myocardial infarction: Secondary | ICD-10-CM | POA: Diagnosis present

## 2023-07-19 DIAGNOSIS — Z936 Other artificial openings of urinary tract status: Secondary | ICD-10-CM | POA: Diagnosis not present

## 2023-07-19 DIAGNOSIS — I10 Essential (primary) hypertension: Secondary | ICD-10-CM

## 2023-07-19 DIAGNOSIS — Z66 Do not resuscitate: Secondary | ICD-10-CM | POA: Diagnosis present

## 2023-07-19 DIAGNOSIS — Z87440 Personal history of urinary (tract) infections: Secondary | ICD-10-CM | POA: Diagnosis not present

## 2023-07-19 DIAGNOSIS — Z951 Presence of aortocoronary bypass graft: Secondary | ICD-10-CM | POA: Diagnosis not present

## 2023-07-19 DIAGNOSIS — N390 Urinary tract infection, site not specified: Secondary | ICD-10-CM | POA: Diagnosis present

## 2023-07-19 DIAGNOSIS — I200 Unstable angina: Secondary | ICD-10-CM | POA: Diagnosis not present

## 2023-07-19 DIAGNOSIS — I480 Paroxysmal atrial fibrillation: Secondary | ICD-10-CM | POA: Diagnosis present

## 2023-07-19 DIAGNOSIS — Z91041 Radiographic dye allergy status: Secondary | ICD-10-CM | POA: Diagnosis not present

## 2023-07-19 DIAGNOSIS — I129 Hypertensive chronic kidney disease with stage 1 through stage 4 chronic kidney disease, or unspecified chronic kidney disease: Secondary | ICD-10-CM | POA: Diagnosis present

## 2023-07-19 DIAGNOSIS — R5381 Other malaise: Secondary | ICD-10-CM | POA: Diagnosis not present

## 2023-07-19 DIAGNOSIS — R7303 Prediabetes: Secondary | ICD-10-CM | POA: Diagnosis present

## 2023-07-19 DIAGNOSIS — B9689 Other specified bacterial agents as the cause of diseases classified elsewhere: Secondary | ICD-10-CM | POA: Diagnosis present

## 2023-07-19 DIAGNOSIS — G8929 Other chronic pain: Secondary | ICD-10-CM | POA: Diagnosis present

## 2023-07-19 DIAGNOSIS — R079 Chest pain, unspecified: Secondary | ICD-10-CM | POA: Diagnosis present

## 2023-07-19 DIAGNOSIS — Z01810 Encounter for preprocedural cardiovascular examination: Secondary | ICD-10-CM | POA: Diagnosis not present

## 2023-07-19 DIAGNOSIS — K449 Diaphragmatic hernia without obstruction or gangrene: Secondary | ICD-10-CM | POA: Diagnosis present

## 2023-07-19 DIAGNOSIS — Z79899 Other long term (current) drug therapy: Secondary | ICD-10-CM | POA: Diagnosis not present

## 2023-07-19 HISTORY — PX: LEFT HEART CATH AND CORONARY ANGIOGRAPHY: CATH118249

## 2023-07-19 HISTORY — PX: CORONARY PRESSURE/FFR STUDY: CATH118243

## 2023-07-19 LAB — BASIC METABOLIC PANEL WITH GFR
Anion gap: 8 (ref 5–15)
BUN: 38 mg/dL — ABNORMAL HIGH (ref 8–23)
CO2: 21 mmol/L — ABNORMAL LOW (ref 22–32)
Calcium: 8.7 mg/dL — ABNORMAL LOW (ref 8.9–10.3)
Chloride: 108 mmol/L (ref 98–111)
Creatinine, Ser: 1.53 mg/dL — ABNORMAL HIGH (ref 0.61–1.24)
GFR, Estimated: 48 mL/min — ABNORMAL LOW (ref >60)
Glucose, Bld: 101 mg/dL — ABNORMAL HIGH (ref 70–99)
Potassium: 4.2 mmol/L (ref 3.5–5.1)
Sodium: 137 mmol/L (ref 135–145)

## 2023-07-19 LAB — CBC WITH DIFFERENTIAL/PLATELET
Abs Immature Granulocytes: 0.03 10*3/uL (ref 0.00–0.07)
Basophils Absolute: 0.1 10*3/uL (ref 0.0–0.1)
Basophils Relative: 1 %
Eosinophils Absolute: 0.1 10*3/uL (ref 0.0–0.5)
Eosinophils Relative: 2 %
HCT: 38.2 % — ABNORMAL LOW (ref 39.0–52.0)
Hemoglobin: 12.8 g/dL — ABNORMAL LOW (ref 13.0–17.0)
Immature Granulocytes: 1 %
Lymphocytes Relative: 38 %
Lymphs Abs: 2.2 10*3/uL (ref 0.7–4.0)
MCH: 28.6 pg (ref 26.0–34.0)
MCHC: 33.5 g/dL (ref 30.0–36.0)
MCV: 85.5 fL (ref 80.0–100.0)
Monocytes Absolute: 0.6 10*3/uL (ref 0.1–1.0)
Monocytes Relative: 11 %
Neutro Abs: 2.7 10*3/uL (ref 1.7–7.7)
Neutrophils Relative %: 47 %
Platelets: 122 10*3/uL — ABNORMAL LOW (ref 150–400)
RBC: 4.47 MIL/uL (ref 4.22–5.81)
RDW: 13.9 % (ref 11.5–15.5)
WBC: 5.7 10*3/uL (ref 4.0–10.5)
nRBC: 0 % (ref 0.0–0.2)

## 2023-07-19 LAB — MAGNESIUM: Magnesium: 1.9 mg/dL (ref 1.7–2.4)

## 2023-07-19 LAB — LIPID PANEL
Cholesterol: 158 mg/dL (ref 0–200)
HDL: 21 mg/dL — ABNORMAL LOW (ref >40)
LDL Cholesterol: 85 mg/dL (ref 0–99)
Total CHOL/HDL Ratio: 7.5 ratio
Triglycerides: 259 mg/dL — ABNORMAL HIGH (ref <150)
VLDL: 52 mg/dL — ABNORMAL HIGH (ref 0–40)

## 2023-07-19 LAB — GLUCOSE, CAPILLARY
Glucose-Capillary: 99 mg/dL (ref 70–99)
Glucose-Capillary: 115 mg/dL — ABNORMAL HIGH (ref 70–99)
Glucose-Capillary: 140 mg/dL — ABNORMAL HIGH (ref 70–99)
Glucose-Capillary: 192 mg/dL — ABNORMAL HIGH (ref 70–99)

## 2023-07-19 SURGERY — LEFT HEART CATH AND CORONARY ANGIOGRAPHY
Anesthesia: LOCAL

## 2023-07-19 MED ORDER — SODIUM CHLORIDE 0.9% FLUSH: 3.0000 mL | INTRAVENOUS | Status: DC | PRN

## 2023-07-19 MED ORDER — LIDOCAINE HCL (PF) 1 % IJ SOLN
INTRAMUSCULAR | Status: AC
  Filled 2023-07-19: qty 30

## 2023-07-19 MED ORDER — HEPARIN SODIUM (PORCINE) 1000 UNIT/ML IJ SOLN
INTRAMUSCULAR | Status: AC
  Filled 2023-07-19: qty 10

## 2023-07-19 MED ORDER — MIDAZOLAM HCL 2 MG/2ML IJ SOLN
INTRAMUSCULAR | Status: DC | PRN
  Administered 2023-07-19: 1 mg via INTRAVENOUS

## 2023-07-19 MED ORDER — SODIUM CHLORIDE 0.9 % IV SOLN: INTRAVENOUS | Status: AC

## 2023-07-19 MED ORDER — MIDAZOLAM HCL 2 MG/2ML IJ SOLN
INTRAMUSCULAR | Status: AC
  Filled 2023-07-19: qty 2

## 2023-07-19 MED ORDER — FENTANYL CITRATE (PF) 100 MCG/2ML IJ SOLN
INTRAMUSCULAR | Status: AC
  Filled 2023-07-19: qty 2

## 2023-07-19 MED ORDER — METHYLPREDNISOLONE SODIUM SUCC 125 MG IJ SOLR
125.0000 mg | Freq: Once | INTRAMUSCULAR | Status: AC
  Administered 2023-07-19: 125 mg via INTRAVENOUS
  Filled 2023-07-19: qty 2

## 2023-07-19 MED ORDER — SODIUM CHLORIDE 0.9% FLUSH
3.0000 mL | Freq: Two times a day (BID) | INTRAVENOUS | Status: DC
  Administered 2023-07-20 – 2023-07-21 (×4): 3 mL via INTRAVENOUS

## 2023-07-19 MED ORDER — HEPARIN (PORCINE) IN NACL 2-0.9 UNITS/ML
INTRAMUSCULAR | Status: DC | PRN
  Administered 2023-07-19: 10 mL via INTRA_ARTERIAL

## 2023-07-19 MED ORDER — VERAPAMIL HCL 2.5 MG/ML IV SOLN
INTRAVENOUS | Status: AC
Start: 2023-07-19 — End: ?
  Filled 2023-07-19: qty 2

## 2023-07-19 MED ORDER — SODIUM CHLORIDE 0.9 % IV SOLN: 250.0000 mL | INTRAVENOUS | Status: AC | PRN

## 2023-07-19 MED ORDER — DIPHENHYDRAMINE HCL 50 MG/ML IJ SOLN
25.0000 mg | Freq: Once | INTRAMUSCULAR | Status: AC
  Administered 2023-07-19: 25 mg via INTRAVENOUS
  Filled 2023-07-19: qty 1

## 2023-07-19 MED ORDER — FENTANYL CITRATE (PF) 100 MCG/2ML IJ SOLN
INTRAMUSCULAR | Status: DC | PRN
  Administered 2023-07-19: 25 ug via INTRAVENOUS

## 2023-07-19 MED ORDER — HEPARIN SODIUM (PORCINE) 1000 UNIT/ML IJ SOLN
INTRAMUSCULAR | Status: DC | PRN
  Administered 2023-07-19 (×2): 5000 [IU] via INTRA_ARTERIAL

## 2023-07-19 MED ORDER — VERAPAMIL HCL 2.5 MG/ML IV SOLN
INTRAVENOUS | Status: AC
  Filled 2023-07-19: qty 2

## 2023-07-19 MED ORDER — LIDOCAINE HCL (PF) 1 % IJ SOLN
INTRAMUSCULAR | Status: DC | PRN
Start: 2023-07-19 — End: 2023-07-19
  Administered 2023-07-19: 2 mL

## 2023-07-19 MED ORDER — LABETALOL HCL 5 MG/ML IV SOLN: 10.0000 mg | INTRAVENOUS | Status: AC | PRN

## 2023-07-19 MED ORDER — HYDRALAZINE HCL 20 MG/ML IJ SOLN: 10.0000 mg | INTRAMUSCULAR | Status: AC | PRN

## 2023-07-19 MED ORDER — IOHEXOL 350 MG/ML SOLN
INTRAVENOUS | Status: DC | PRN
  Administered 2023-07-19: 50 mL via INTRA_ARTERIAL

## 2023-07-19 SURGICAL SUPPLY — 11 items
CATH INFINITI 5FR ANG PIGTAIL (CATHETERS) IMPLANT
CATH INFINITI AMBI 6FR TG (CATHETERS) IMPLANT
CATH VISTA GUIDE 6FR JR4 ECOPK (CATHETERS) IMPLANT
DEVICE RAD COMP TR BAND LRG (VASCULAR PRODUCTS) IMPLANT
ELECT DEFIB PAD ADLT CADENCE (PAD) IMPLANT
GLIDESHEATH SLEND SS 6F .021 (SHEATH) IMPLANT
GUIDEWIRE PRESSURE X 175 (WIRE) IMPLANT
KIT ESSENTIALS PG (KITS) IMPLANT
PACK CARDIAC CATHETERIZATION (CUSTOM PROCEDURE TRAY) ×1 IMPLANT
SET ATX-X65L (MISCELLANEOUS) IMPLANT
WIRE EMERALD 3MM-J .035X260CM (WIRE) IMPLANT

## 2023-07-19 NOTE — Consult Note (Cosign Needed Addendum)
 [address]       [place] [address]             [phone]        Mark [name] [facility] Medical Record #[id number] Date of Birth: 1950/05/29  Referring: Dr. Amalia Badder, MD Primary Care: Benedetto Brady, MD Primary Cardiologist:Peter Swaziland, MD  Chief Complaint:    Chief Complaint  Patient presents with   Chest Pain  Reason for consultation: Coronary artery disease  History of Present Illness:     This is a 73 year old male with a past medical history of "remote, 2000) paroxysmal atrial fibrillation (not on anticoagulation), chronic kidney disease (stage IIIa), bladder cancer (status post urostomy), colon cancer, GERD, hypertension, hyperlipidemia, and pre diabetes who presented to the ED via EMS with complaints of chest pain with radiation to the jaw. He also stated he had shortness of breath and diaphoresis. He denied abdominal pain, nausea, or vomiting. EKG was apparently not suggestive of ischemia. Initial Troponin I (high sensitivity) was 18 and max 23. Chest x ray showed no active cardiopulmonary disease. CT coronary calcium score done 07/17/2023 was 891 (for specifics please see study result in EPIC). He then underwent an echocardiogram 07/18/2023 that showed LVEF 55-60%, trivial AI, mild MR.  Cardiac catheterization done today showed distal LM to ostial LAD with a 65% stenosis and minimal luminal irregularities of the LAD and left Circumflex, and moderate, diffuse disease of the RCA. Also, urinalysis showed large leukocytes  and > 50 WBC and he is on Ceftriaxone  for UTI (he has a history of chronic UTIs). Urine culture is pending at this time. At the time of my examination, patient denied chest pain or shortness of breath. His son and daughter in law are at the bedside. He does take Oxy for chronic LE neuropathy. His son did mention after receiving Benadryl earlier he seemed " a little out of it" but this has since resolved.   Current Activity/ Functional  Status: Patient is independent with mobility/ambulation, transfers, ADL's, IADL's.   Zubrod Score: At the time of surgery this patient's most appropriate activity status/level should be described as: []     0    Normal activity, no symptoms [x]     1    Restricted in physical strenuous activity but ambulatory, able to do out light work []     2    Ambulatory and capable of self care, unable to do work activities, up and about more than 50%  Of the time                            []     3    Only limited self care, in bed greater than 50% of waking hours []     4    Completely disabled, no self care, confined to bed or chair []     5    Moribund  Past Medical History:  Diagnosis Date   Atrial fibrillation [facility])    Bladder cancer (HCC) 2006   Cataracts, both eyes    Chronic kidney disease     BLADDER CANCER 2006  UROSTOMY    Colon adenocarcinoma (HCC) 2001   T2, N1   Erosive esophagitis    GERD (gastroesophageal reflux disease)    Hiatal hernia    History of blood transfusion    Hyperlipidemia    Hypertension    Internal hemorrhoids    Neuropathy  feet   PONV (postoperative nausea and vomiting)    Prediabetes    Tubular adenoma of colon 12/2009    Past Surgical History:  Procedure Laterality Date   APPENDECTOMY     BLADDER REMOVAL  2006   has urostomy//hx bladder ca   CARDIAC CATHETERIZATION     CHOLECYSTECTOMY     1999   COLON SURGERY     COLONOSCOPY  2022   PROSTATECTOMY  2006   SPINAL CORD STIMULATOR REMOVAL  12/29/2010   Procedure: CERVICAL SPINAL CORD STIMULATOR REMOVAL;  Surgeon: Lei Pump, MD;  Location: [facility];  Service: Neurosurgery;  Laterality: N/A;  REMOVAL OF SPINAL CORD STIMULATOR, ELECTRODE AND IPG    Social History   Tobacco Use  Smoking Status Never  Smokeless Tobacco Never    Social History   Substance and Sexual Activity  Alcohol Use No    Allergies: Allergies  Allergen Reactions   Penicillins Hives and Other (See Comments)     rash   Atorvastatin     Other reaction(s): muscle aches   Contrast Media  [Iodinated Contrast Media]     Current Facility-Administered Medications  Medication Dose Route Frequency Provider Last Rate Last Admin   0.9 %  sodium chloride  infusion   Intravenous Continuous Thukkani, Arun K, MD 50 mL/hr at 07/19/23 1554 New Bag at 07/19/23 1554   0.9 %  sodium chloride  infusion  250 mL Intravenous PRN Thukkani, Arun K, MD       acetaminophen  (TYLENOL ) tablet 650 mg  650 mg Oral Q4H PRN Audria Leather, MD   650 mg at 07/19/23 0804   alum & mag hydroxide-simeth (MAALOX/MYLANTA) 200-200-20 MG/5ML suspension 15 mL  15 mL Oral Q4H PRN Audria Leather, MD       amLODipine (NORVASC) tablet 5 mg  5 mg Oral Daily Lambert, Cameron T, MD   5 mg at 07/19/23 1002   aspirin EC tablet 81 mg  81 mg Oral Daily Audria Leather, MD   81 mg at 07/19/23 0500   carvedilol (COREG) tablet 6.25 mg  6.25 mg Oral BID WC Williams, Evan, PA-C   6.25 mg at 07/19/23 0800   cefTRIAXone  (ROCEPHIN ) 1 g in sodium chloride  0.9 % 100 mL IVPB  1 g Intravenous Q24H Alekh, Kshitiz, MD 200 mL/hr at 07/19/23 0800 1 g at 07/19/23 0800   DULoxetine  (CYMBALTA ) DR capsule 30 mg  30 mg Oral Daily Alekh, Kshitiz, MD   30 mg at 07/19/23 1002   enoxaparin (LOVENOX) injection 40 mg  40 mg Subcutaneous Q24H Audria Leather, MD   40 mg at 07/18/23 1512   hydrALAZINE (APRESOLINE) injection 10 mg  10 mg Intravenous Q20 Min PRN Thukkani, Arun K, MD       insulin aspart (novoLOG) injection 0-15 Units  0-15 Units Subcutaneous TID WC Audria Leather, MD   3 Units at 07/18/23 1239   insulin aspart (novoLOG) injection 0-5 Units  0-5 Units Subcutaneous QHS Alekh, Kshitiz, MD       labetalol (NORMODYNE) injection 10 mg  10 mg Intravenous Q10 min PRN Thukkani, Arun K, MD       morphine  (PF) 2 MG/ML injection 2 mg  2 mg Intravenous Q2H PRN Alekh, Kshitiz, MD       nitroGLYCERIN (NITROSTAT) SL tablet 0.4 mg  0.4 mg Sublingual Q5 min PRN Alekh, Kshitiz, MD        ondansetron  (ZOFRAN ) injection 4 mg  4 mg Intravenous Q6H PRN Audria Leather, MD   4  mg at 07/16/23 4098   oxyCODONE  (Oxy IR/ROXICODONE ) immediate release tablet 10 mg  10 mg Oral Q6H PRN Audria Leather, MD   10 mg at 07/19/23 1013   pantoprazole  (PROTONIX ) EC tablet 40 mg  40 mg Oral Daily Maury Space, Kshitiz, MD   40 mg at 07/19/23 0800   rosuvastatin (CRESTOR) tablet 20 mg  20 mg Oral Daily Williams, Evan, PA-C   20 mg at 07/19/23 1002   sodium chloride  flush (NS) 0.9 % injection 3 mL  3 mL Intravenous Q12H Thukkani, Arun K, MD       sodium chloride  flush (NS) 0.9 % injection 3 mL  3 mL Intravenous PRN Thukkani, Arun K, MD       temazepam  (RESTORIL ) capsule 30 mg  30 mg Oral QHS PRN Audria Leather, MD        Medications Prior to Admission  Medication Sig Dispense Refill Last Dose/Taking   Ascorbic Acid (VITAMIN C PO) Take 1 tablet by mouth daily.   07/14/2023   aspirin 325 MG tablet Take 325 mg by mouth as needed for mild pain (pain score 1-3) or moderate pain (pain score 4-6).   07/16/2023   aspirin EC 81 MG tablet Take 81 mg by mouth in the morning. Swallow whole.   07/15/2023   bisacodyl (DULCOLAX) 5 MG EC tablet Take 5 mg by mouth daily as needed.   Unknown   Cyanocobalamin (VITAMIN B12 PO) Take 1 tablet by mouth daily.   07/14/2023   DULoxetine  (CYMBALTA ) 30 MG capsule Take 30 mg by mouth daily.   07/15/2023   ferrous sulfate 324 MG TBEC Take 324 mg by mouth daily.   07/14/2023   MAGNESIUM PO Take 1 tablet by mouth daily.   07/14/2023   metFORMIN (GLUCOPHAGE) 500 MG tablet Take 500 mg by mouth as needed.   Past Week   Multiple Vitamins-Minerals (MULTIVITAMINS THER. W/MINERALS) TABS Take 1 tablet by mouth daily.   07/14/2023   nitrofurantoin (MACRODANTIN) 100 MG capsule Take 100 mg by mouth daily.   07/16/2023   Oxycodone  HCl 10 MG TABS Take 1 tablet by mouth every 6 (six) hours as needed.   07/16/2023 at  5:30 AM   temazepam  (RESTORIL ) 30 MG capsule Take 30 mg by mouth at bedtime. For sleep.   Past  Week    Family History  Problem Relation Age of Onset   Heart failure Mother    Breast cancer Sister    Colon cancer Neg Hx    Stomach cancer Neg Hx    Esophageal cancer Neg Hx    Rectal cancer Neg Hx    Colon polyps Neg Hx    Review of Systems:     Cardiac Review of Systems: Y or  [ N   ]= no  Chest Pain [  Y-on admission ]     Pedal Edema [ N  ]  Syncope  [ N ]     General Review of Systems: [Y] = yes [N  ]=no Constitional:nausea [  N];  fever Discordia.Diesel  ];  Dental: Last Dentist visit: 2 weeks ago. Needs to have 1 cavity filled  Eye : floaters [ on occasion ];  Resp: cough Discordia.Diesel  ];  wheezing[ N ];  hemoptysis[ N ]; GI:   vomiting[N  ]; melena[N  ];  hematochezia [ N ];  GU: hematuria[N  ];                Skin: rash, swelling[ N ];,  Heme/Lymph:   anemia[ Y ];  Neuro: TIA[ N ];   stroke[N  ];    Endocrine: pre diabetes[Y  ];    Physical Exam: BP 124/63 (BP Location: Left Arm)   Pulse 71   Temp 97.9 F (36.6 C) (Oral)   Resp 18   Ht 5\' 5"  (1.651 m)   Wt 75.3 kg   SpO2 96%   BMI 27.62 kg/m    General appearance: alert, cooperative, and no distress Head: Normocephalic, without obvious abnormality, atraumatic Neck: no carotid bruit, no JVD, and supple, symmetrical, trachea midline Cardio: RRR, no murmur GI: Soft, mild diffuse tenderness to deep palpation, bowel sounds, urostomy bag in place, and well healed ex lap wound Extremities: No LE edema. Feet warm bilaterally Neurologic: Grossly normal  Diagnostic Studies & Laboratory data:     Recent Radiology Findings:   CARDIAC CATHETERIZATION Result Date: 07/19/2023   Dist LM to Ost LAD lesion is 65% stenosed. 1.  65% distal left main lesion with mild diffuse disease of the LAD and left circumflex. 2.  Moderate diffuse disease of the right coronary artery; a pressure wire assessment was performed to assess the hemodynamic significance of this burden of coronary atherosclerosis.  The RFR was 0.96 with a pressure wire position in  the right posterolateral ventricular branch consistent with hemodynamically insignificant disease. 3.  LVEDP of 14 mmHg Recommendation: Cardiothoracic surgical evaluation.  Results were reviewed with the patient's brother Tim via phone.   Diagnostic Dominance: Right  Intervention   Implants   ECHOCARDIOGRAM COMPLETE Result Date: 07/18/2023    ECHOCARDIOGRAM REPORT   Patient Name:   Mark [name] Date of Exam: 07/18/2023 Medical Rec #:  [id number]       Height:       65.0 in Accession #:    [id number]      Weight:       166.8 lb Date of Birth:  [birth year]       BSA:          1.831 m Patient Age:    73 years        BP:           132/70 mmHg Patient Gender: M               HR:           73 bpm. Exam Location:  Inpatient Procedure: 2D Echo, Color Doppler and Cardiac Doppler (Both Spectral and Color            Flow Doppler were utilized during procedure). Indications:    Chest pain  History:        Patient has prior history of Echocardiogram examinations and                 Patient has no prior history of Echocardiogram examinations.                 Signs/Symptoms:Chest Pain.  Sonographer:    Jeralene Mom Referring Phys: [id number] CAMERON T LAMBERT IMPRESSIONS  1. Left ventricular ejection fraction, by estimation, is 55 to 60%. Left ventricular ejection fraction by 2D MOD biplane is 55.2 %. The left ventricle has normal function. The left ventricle has no regional wall motion abnormalities. Left ventricular diastolic parameters were normal.  2. Peak RV-RA gradient 17 mmHg. Right ventricular systolic function is normal. The right ventricular size is normal.  3. Left atrial size was mildly dilated.  4. The mitral valve is normal in structure. Mild mitral  valve regurgitation. No evidence of mitral stenosis.  5. The aortic valve is tricuspid. There is mild calcification of the aortic valve. Aortic valve regurgitation is trivial. No aortic stenosis is present.  6. IVC not visualized. FINDINGS  Left Ventricle: Left  ventricular ejection fraction, by estimation, is 55 to 60%. Left ventricular ejection fraction by 2D MOD biplane is 55.2 %. The left ventricle has normal function. The left ventricle has no regional wall motion abnormalities. The left ventricular internal cavity size was normal in size. There is no left ventricular hypertrophy. Left ventricular diastolic parameters were normal. Right Ventricle: Peak RV-RA gradient 17 mmHg. The right ventricular size is normal. No increase in right ventricular wall thickness. Right ventricular systolic function is normal. Left Atrium: Left atrial size was mildly dilated. Right Atrium: Right atrial size was normal in size. Pericardium: There is no evidence of pericardial effusion. Mitral Valve: The mitral valve is normal in structure. There is mild calcification of the mitral valve leaflet(s). Mild mitral annular calcification. Mild mitral valve regurgitation. No evidence of mitral valve stenosis. Tricuspid Valve: The tricuspid valve is normal in structure. Tricuspid valve regurgitation is trivial. Aortic Valve: The aortic valve is tricuspid. There is mild calcification of the aortic valve. Aortic valve regurgitation is trivial. No aortic stenosis is present. Aortic valve mean gradient measures 3.0 mmHg. Aortic valve peak gradient measures 5.3 mmHg. Aortic valve area, by VTI measures 2.90 cm. Pulmonic Valve: The pulmonic valve was normal in structure. Pulmonic valve regurgitation is not visualized. Aorta: The aortic root is normal in size and structure. Venous: IVC not visualized. IAS/Shunts: No atrial level shunt detected by color flow Doppler.  LEFT VENTRICLE PLAX 2D                        Biplane EF (MOD) LVIDd:         4.20 cm         LV Biplane EF:   Left LVIDs:         3.10 cm                          ventricular LV PW:         0.90 cm                          ejection LV IVS:        0.90 cm                          fraction by LVOT diam:     2.30 cm                          2D  MOD LV SV:         77                               biplane is LV SV Index:   42                               55.2 %. LVOT Area:     4.15 cm  Diastology                                LV e' medial:    8.81 cm/s LV Volumes (MOD)               LV E/e' medial:  12.5 LV vol d, MOD    68.1 ml       LV e' lateral:   10.90 cm/s A2C:                           LV E/e' lateral: 10.1 LV vol d, MOD    83.5 ml A4C: LV vol s, MOD    31.3 ml A2C: LV vol s, MOD    34.2 ml A4C: LV SV MOD A2C:   36.8 ml LV SV MOD A4C:   83.5 ml LV SV MOD BP:    41.9 ml RIGHT VENTRICLE RV Basal diam:  3.80 cm RV Mid diam:    3.20 cm RV S prime:     12.90 cm/s TAPSE (M-mode): 2.2 cm LEFT ATRIUM             Index        RIGHT ATRIUM           Index LA diam:        3.80 cm 2.08 cm/m   RA Area:     13.90 cm LA Vol (A2C):   80.1 ml 43.74 ml/m  RA Volume:   36.00 ml  19.66 ml/m LA Vol (A4C):   44.3 ml 24.19 ml/m LA Biplane Vol: 62.1 ml 33.91 ml/m  AORTIC VALVE AV Area (Vmax):    3.13 cm AV Area (Vmean):   2.75 cm AV Area (VTI):     2.90 cm AV Vmax:           115.00 cm/s AV Vmean:          86.900 cm/s AV VTI:            0.265 m AV Peak Grad:      5.3 mmHg AV Mean Grad:      3.0 mmHg LVOT Vmax:         86.60 cm/s LVOT Vmean:        57.500 cm/s LVOT VTI:          0.185 m LVOT/AV VTI ratio: 0.70  AORTA Ao Root diam: 3.30 cm Ao Asc diam:  3.40 cm MITRAL VALVE                TRICUSPID VALVE MV Area (PHT): 4.49 cm     TR Peak grad:   17.6 mmHg MV Decel Time: 169 msec     TR Vmax:        210.00 cm/s MV E velocity: 110.00 cm/s MV A velocity: 84.60 cm/s   SHUNTS MV E/A ratio:  1.30         Systemic VTI:  0.18 m                             Systemic Diam: 2.30 cm Dalton McleanMD Electronically signed by Archer Bear Signature Date/Time: 07/18/2023/2:36:15 PM    Final    CT CORONARY FFR DATA PREP & FLUID ANALYSIS Result Date: 07/18/2023 EXAM: CT FFR ANALYSIS CLINICAL DATA:  Chest pain FINDINGS: FFRct analysis was performed on  the original cardiac  CT angiogram dataset. Diagrammatic representation of the FFRct analysis is provided in a separate PDF document in PACS. This dictation was created using the PDF document and an interactive 3D model of the results. 3D model is not available in the EMR/PACS. Normal FFR range is >0.80. Indeterminate (grey) zone is 0.76-0.80. FFR delta of 0.13 is considered significant. 1. Left Main: FFR = 1.0 2. LAD: Proximal FFR = 0.84, Mid FFR = 0.79, Distal FFR = 0.72 3. D1: Not modeled due to small caliber vessel. 4. D2: Proximal FFR = 0.78, Distal FFR = not modeled due to small caliber vessel 5. D3: Proximal FFR = Not modeled due to small caliber vessel. 6. LCX: Proximal FFR = 0.84, Distal FFR = 0.79 7. OM1: Not modeled due to small caliber vessel. 8. OM2: Proximal FFR = 0.78, Distal FFR = 0.74 9. OM3: Not modeled due to small caliber vessel. 10. RCA: Proximal FFR = 0.99, Mid FFR =0.90, Distal FFR = 0.88 Plaque Analysis: Calcified plaque 151 mm3 Non-calcified plaque 691 mm3 (low attenuation plaque 11 mm3). Total plaque volume (TPV) is 842 mm3, categorized as extensive, which is 61st percentile for age- and sex-matched controls. IMPRESSION: 1. CT FFR analysis illustrates significant stenosis either at the distal left main or ostial LAD/LCX bifurcation. The delta FFR is greater than 0.13 which is considered significant. 2. Mid to distal CT FFR values in the LAD and LCX are significant as well; however, this is likely due to flow degradation, clinical correlation required. 3. Total plaque volume (TPV) is 842 mm3, categorized as extensive, which is 61st percentile for age- and sex-matched controls. RECOMMENDATIONS: Invasive angiography is warranted if no contraindication to further evaluate the distal left main as it bifurcates into the LAD / LCx. Guideline-directed medical therapy and aggressive risk factor modification for secondary prevention of coronary artery disease. Results conveyed telephonically to  cardiology rounding team, physician extender. Electronically Signed   By: Olinda Bertrand   On: 07/18/2023 09:45     I have independently reviewed the above radiologic studies and discussed with the patient   Recent Lab Findings: Lab Results  Component Value Date   WBC 5.7 07/19/2023   HGB 12.8 (L) 07/19/2023   HCT 38.2 (L) 07/19/2023   PLT 122 (L) 07/19/2023   GLUCOSE 101 (H) 07/19/2023   CHOL 158 07/19/2023   TRIG 259 (H) 07/19/2023   HDL 21 (L) 07/19/2023   LDLCALC 85 07/19/2023   ALT 24 07/17/2023   AST 29 07/17/2023   NA 137 07/19/2023   K 4.2 07/19/2023   CL 108 07/19/2023   CREATININE 1.53 (H) 07/19/2023   BUN 38 (H) 07/19/2023   CO2 21 (L) 07/19/2023   HGBA1C 6.1 (H) 07/17/2023   Assessment / Plan:   Coronary artery disease-Dr. Luna Salinas to determine if candidate for CABG and timing if a candidate History of hypertension-on Amlodipine 5 mg daily, Carvedilol 6.25 mg bid History of atrial fibrillation 4. History of CKD (stage III)-creatinine today decreased to 1.53. Prior to admission, creatinine was 1.45. Avoid nephrotoxic agents 5. History of hyperlipidemia-on Rosuvastatin 20 mg daily 6. History of pre diabetes-HGA1C 6.1. He has mostly been diet controlled. He was on Metformin prior to admission as HGA1C closer to diabetes of late 7. Thrombocytopenia-platelets today slightly decreased to 122,000 8. History of anemia-H and H this am. On Ferrous sulfate. 9. History of chronic UTIs (has urostomy) -on Ceftriaxone  10. On Lovenox for DVT prophylaxis  I  spent 25 minutes counseling the patient face to  face.   Cassidi Modesitt PA-C 07/19/2023 4:33 PM  I have seen and examined Mr. Bruntz.  I reviewed his records and cath images.  I concur with the findings noted above.  73 year-old man with a history of hypertension, hyperlipidemia, type II diabetes without complication, stage IIIA CKD, history of bladder cancer with urostomy, recurrent UTI, history of colon cancer and  history of atrial fibrillation about 10 years ago.  He presented with unstable CP and had minimally elevated high sensitivity troponin.  At cath found to have a 65% distal left main stenosis.  CABG indicated for survival benefit and relief of symptoms.  I discussed the general nature of the procedure, including the need for general anesthesia, the incisions to be used, the use of cardiopulmonary bypass, and the use of temporary pacemaker wires and drainage tubes postoperatively with Mr. Van.  We discussed the expected hospital stay, overall recovery and short and long term outcomes. I informed him of the indications, risks, benefits and alternatives.   He understands the risks include, but are not limited to death, stroke, MI, DVT/PE, bleeding, possible need for transfusion, infections, cardiac arrhythmias, as well as other organ system dysfunction including respiratory, renal, or GI complications.    He accepts the risks and agrees to proceed.  For CABG Friday AM  Landon Pinion C. Luna Salinas, MD [facility] [phone]

## 2023-07-19 NOTE — H&P (View-Only) (Signed)
 [address]       [place] [address]             [phone]        Mark [name] [facility] Medical Record #[id number] Date of Birth: 1950/05/29  Referring: Dr. Amalia Badder, MD Primary Care: Benedetto Brady, MD Primary Cardiologist:Peter Swaziland, MD  Chief Complaint:    Chief Complaint  Patient presents with   Chest Pain  Reason for consultation: Coronary artery disease  History of Present Illness:     This is a 73 year old male with a past medical history of "remote, 2000) paroxysmal atrial fibrillation (not on anticoagulation), chronic kidney disease (stage IIIa), bladder cancer (status post urostomy), colon cancer, GERD, hypertension, hyperlipidemia, and pre diabetes who presented to the ED via EMS with complaints of chest pain with radiation to the jaw. He also stated he had shortness of breath and diaphoresis. He denied abdominal pain, nausea, or vomiting. EKG was apparently not suggestive of ischemia. Initial Troponin I (high sensitivity) was 18 and max 23. Chest x ray showed no active cardiopulmonary disease. CT coronary calcium score done 07/17/2023 was 891 (for specifics please see study result in EPIC). He then underwent an echocardiogram 07/18/2023 that showed LVEF 55-60%, trivial AI, mild MR.  Cardiac catheterization done today showed distal LM to ostial LAD with a 65% stenosis and minimal luminal irregularities of the LAD and left Circumflex, and moderate, diffuse disease of the RCA. Also, urinalysis showed large leukocytes  and > 50 WBC and he is on Ceftriaxone  for UTI (he has a history of chronic UTIs). Urine culture is pending at this time. At the time of my examination, patient denied chest pain or shortness of breath. His son and daughter in law are at the bedside. He does take Oxy for chronic LE neuropathy. His son did mention after receiving Benadryl earlier he seemed " a little out of it" but this has since resolved.   Current Activity/ Functional  Status: Patient is independent with mobility/ambulation, transfers, ADL's, IADL's.   Zubrod Score: At the time of surgery this patient's most appropriate activity status/level should be described as: []     0    Normal activity, no symptoms [x]     1    Restricted in physical strenuous activity but ambulatory, able to do out light work []     2    Ambulatory and capable of self care, unable to do work activities, up and about more than 50%  Of the time                            []     3    Only limited self care, in bed greater than 50% of waking hours []     4    Completely disabled, no self care, confined to bed or chair []     5    Moribund  Past Medical History:  Diagnosis Date   Atrial fibrillation [facility])    Bladder cancer (HCC) 2006   Cataracts, both eyes    Chronic kidney disease     BLADDER CANCER 2006  UROSTOMY    Colon adenocarcinoma (HCC) 2001   T2, N1   Erosive esophagitis    GERD (gastroesophageal reflux disease)    Hiatal hernia    History of blood transfusion    Hyperlipidemia    Hypertension    Internal hemorrhoids    Neuropathy  feet   PONV (postoperative nausea and vomiting)    Prediabetes    Tubular adenoma of colon 12/2009    Past Surgical History:  Procedure Laterality Date   APPENDECTOMY     BLADDER REMOVAL  2006   has urostomy//hx bladder ca   CARDIAC CATHETERIZATION     CHOLECYSTECTOMY     1999   COLON SURGERY     COLONOSCOPY  2022   PROSTATECTOMY  2006   SPINAL CORD STIMULATOR REMOVAL  12/29/2010   Procedure: CERVICAL SPINAL CORD STIMULATOR REMOVAL;  Surgeon: Lei Pump, MD;  Location: [facility];  Service: Neurosurgery;  Laterality: N/A;  REMOVAL OF SPINAL CORD STIMULATOR, ELECTRODE AND IPG    Social History   Tobacco Use  Smoking Status Never  Smokeless Tobacco Never    Social History   Substance and Sexual Activity  Alcohol Use No    Allergies: Allergies  Allergen Reactions   Penicillins Hives and Other (See Comments)     rash   Atorvastatin     Other reaction(s): muscle aches   Contrast Media  [Iodinated Contrast Media]     Current Facility-Administered Medications  Medication Dose Route Frequency Provider Last Rate Last Admin   0.9 %  sodium chloride  infusion   Intravenous Continuous Thukkani, Arun K, MD 50 mL/hr at 07/19/23 1554 New Bag at 07/19/23 1554   0.9 %  sodium chloride  infusion  250 mL Intravenous PRN Thukkani, Arun K, MD       acetaminophen  (TYLENOL ) tablet 650 mg  650 mg Oral Q4H PRN Audria Leather, MD   650 mg at 07/19/23 0804   alum & mag hydroxide-simeth (MAALOX/MYLANTA) 200-200-20 MG/5ML suspension 15 mL  15 mL Oral Q4H PRN Audria Leather, MD       amLODipine (NORVASC) tablet 5 mg  5 mg Oral Daily Lambert, Cameron T, MD   5 mg at 07/19/23 1002   aspirin EC tablet 81 mg  81 mg Oral Daily Audria Leather, MD   81 mg at 07/19/23 0500   carvedilol (COREG) tablet 6.25 mg  6.25 mg Oral BID WC Williams, Evan, PA-C   6.25 mg at 07/19/23 0800   cefTRIAXone  (ROCEPHIN ) 1 g in sodium chloride  0.9 % 100 mL IVPB  1 g Intravenous Q24H Alekh, Kshitiz, MD 200 mL/hr at 07/19/23 0800 1 g at 07/19/23 0800   DULoxetine  (CYMBALTA ) DR capsule 30 mg  30 mg Oral Daily Alekh, Kshitiz, MD   30 mg at 07/19/23 1002   enoxaparin (LOVENOX) injection 40 mg  40 mg Subcutaneous Q24H Audria Leather, MD   40 mg at 07/18/23 1512   hydrALAZINE (APRESOLINE) injection 10 mg  10 mg Intravenous Q20 Min PRN Thukkani, Arun K, MD       insulin aspart (novoLOG) injection 0-15 Units  0-15 Units Subcutaneous TID WC Audria Leather, MD   3 Units at 07/18/23 1239   insulin aspart (novoLOG) injection 0-5 Units  0-5 Units Subcutaneous QHS Alekh, Kshitiz, MD       labetalol (NORMODYNE) injection 10 mg  10 mg Intravenous Q10 min PRN Thukkani, Arun K, MD       morphine  (PF) 2 MG/ML injection 2 mg  2 mg Intravenous Q2H PRN Alekh, Kshitiz, MD       nitroGLYCERIN (NITROSTAT) SL tablet 0.4 mg  0.4 mg Sublingual Q5 min PRN Alekh, Kshitiz, MD        ondansetron  (ZOFRAN ) injection 4 mg  4 mg Intravenous Q6H PRN Audria Leather, MD   4  mg at 07/16/23 4098   oxyCODONE  (Oxy IR/ROXICODONE ) immediate release tablet 10 mg  10 mg Oral Q6H PRN Audria Leather, MD   10 mg at 07/19/23 1013   pantoprazole  (PROTONIX ) EC tablet 40 mg  40 mg Oral Daily Maury Space, Kshitiz, MD   40 mg at 07/19/23 0800   rosuvastatin (CRESTOR) tablet 20 mg  20 mg Oral Daily Williams, Evan, PA-C   20 mg at 07/19/23 1002   sodium chloride  flush (NS) 0.9 % injection 3 mL  3 mL Intravenous Q12H Thukkani, Arun K, MD       sodium chloride  flush (NS) 0.9 % injection 3 mL  3 mL Intravenous PRN Thukkani, Arun K, MD       temazepam  (RESTORIL ) capsule 30 mg  30 mg Oral QHS PRN Audria Leather, MD        Medications Prior to Admission  Medication Sig Dispense Refill Last Dose/Taking   Ascorbic Acid (VITAMIN C PO) Take 1 tablet by mouth daily.   07/14/2023   aspirin 325 MG tablet Take 325 mg by mouth as needed for mild pain (pain score 1-3) or moderate pain (pain score 4-6).   07/16/2023   aspirin EC 81 MG tablet Take 81 mg by mouth in the morning. Swallow whole.   07/15/2023   bisacodyl (DULCOLAX) 5 MG EC tablet Take 5 mg by mouth daily as needed.   Unknown   Cyanocobalamin (VITAMIN B12 PO) Take 1 tablet by mouth daily.   07/14/2023   DULoxetine  (CYMBALTA ) 30 MG capsule Take 30 mg by mouth daily.   07/15/2023   ferrous sulfate 324 MG TBEC Take 324 mg by mouth daily.   07/14/2023   MAGNESIUM PO Take 1 tablet by mouth daily.   07/14/2023   metFORMIN (GLUCOPHAGE) 500 MG tablet Take 500 mg by mouth as needed.   Past Week   Multiple Vitamins-Minerals (MULTIVITAMINS THER. W/MINERALS) TABS Take 1 tablet by mouth daily.   07/14/2023   nitrofurantoin (MACRODANTIN) 100 MG capsule Take 100 mg by mouth daily.   07/16/2023   Oxycodone  HCl 10 MG TABS Take 1 tablet by mouth every 6 (six) hours as needed.   07/16/2023 at  5:30 AM   temazepam  (RESTORIL ) 30 MG capsule Take 30 mg by mouth at bedtime. For sleep.   Past  Week    Family History  Problem Relation Age of Onset   Heart failure Mother    Breast cancer Sister    Colon cancer Neg Hx    Stomach cancer Neg Hx    Esophageal cancer Neg Hx    Rectal cancer Neg Hx    Colon polyps Neg Hx    Review of Systems:     Cardiac Review of Systems: Y or  [ N   ]= no  Chest Pain [  Y-on admission ]     Pedal Edema [ N  ]  Syncope  [ N ]     General Review of Systems: [Y] = yes [N  ]=no Constitional:nausea [  N];  fever Discordia.Diesel  ];  Dental: Last Dentist visit: 2 weeks ago. Needs to have 1 cavity filled  Eye : floaters [ on occasion ];  Resp: cough Discordia.Diesel  ];  wheezing[ N ];  hemoptysis[ N ]; GI:   vomiting[N  ]; melena[N  ];  hematochezia [ N ];  GU: hematuria[N  ];                Skin: rash, swelling[ N ];,  Heme/Lymph:   anemia[ Y ];  Neuro: TIA[ N ];   stroke[N  ];    Endocrine: pre diabetes[Y  ];    Physical Exam: BP 124/63 (BP Location: Left Arm)   Pulse 71   Temp 97.9 F (36.6 C) (Oral)   Resp 18   Ht 5\' 5"  (1.651 m)   Wt 75.3 kg   SpO2 96%   BMI 27.62 kg/m    General appearance: alert, cooperative, and no distress Head: Normocephalic, without obvious abnormality, atraumatic Neck: no carotid bruit, no JVD, and supple, symmetrical, trachea midline Cardio: RRR, no murmur GI: Soft, mild diffuse tenderness to deep palpation, bowel sounds, urostomy bag in place, and well healed ex lap wound Extremities: No LE edema. Feet warm bilaterally Neurologic: Grossly normal  Diagnostic Studies & Laboratory data:     Recent Radiology Findings:   CARDIAC CATHETERIZATION Result Date: 07/19/2023   Dist LM to Ost LAD lesion is 65% stenosed. 1.  65% distal left main lesion with mild diffuse disease of the LAD and left circumflex. 2.  Moderate diffuse disease of the right coronary artery; a pressure wire assessment was performed to assess the hemodynamic significance of this burden of coronary atherosclerosis.  The RFR was 0.96 with a pressure wire position in  the right posterolateral ventricular branch consistent with hemodynamically insignificant disease. 3.  LVEDP of 14 mmHg Recommendation: Cardiothoracic surgical evaluation.  Results were reviewed with the patient's brother Tim via phone.   Diagnostic Dominance: Right  Intervention   Implants   ECHOCARDIOGRAM COMPLETE Result Date: 07/18/2023    ECHOCARDIOGRAM REPORT   Patient Name:   Mark [name] Date of Exam: 07/18/2023 Medical Rec #:  [id number]       Height:       65.0 in Accession #:    [id number]      Weight:       166.8 lb Date of Birth:  [birth year]       BSA:          1.831 m Patient Age:    73 years        BP:           132/70 mmHg Patient Gender: M               HR:           73 bpm. Exam Location:  Inpatient Procedure: 2D Echo, Color Doppler and Cardiac Doppler (Both Spectral and Color            Flow Doppler were utilized during procedure). Indications:    Chest pain  History:        Patient has prior history of Echocardiogram examinations and                 Patient has no prior history of Echocardiogram examinations.                 Signs/Symptoms:Chest Pain.  Sonographer:    Jeralene Mom Referring Phys: [id number] CAMERON T LAMBERT IMPRESSIONS  1. Left ventricular ejection fraction, by estimation, is 55 to 60%. Left ventricular ejection fraction by 2D MOD biplane is 55.2 %. The left ventricle has normal function. The left ventricle has no regional wall motion abnormalities. Left ventricular diastolic parameters were normal.  2. Peak RV-RA gradient 17 mmHg. Right ventricular systolic function is normal. The right ventricular size is normal.  3. Left atrial size was mildly dilated.  4. The mitral valve is normal in structure. Mild mitral  valve regurgitation. No evidence of mitral stenosis.  5. The aortic valve is tricuspid. There is mild calcification of the aortic valve. Aortic valve regurgitation is trivial. No aortic stenosis is present.  6. IVC not visualized. FINDINGS  Left Ventricle: Left  ventricular ejection fraction, by estimation, is 55 to 60%. Left ventricular ejection fraction by 2D MOD biplane is 55.2 %. The left ventricle has normal function. The left ventricle has no regional wall motion abnormalities. The left ventricular internal cavity size was normal in size. There is no left ventricular hypertrophy. Left ventricular diastolic parameters were normal. Right Ventricle: Peak RV-RA gradient 17 mmHg. The right ventricular size is normal. No increase in right ventricular wall thickness. Right ventricular systolic function is normal. Left Atrium: Left atrial size was mildly dilated. Right Atrium: Right atrial size was normal in size. Pericardium: There is no evidence of pericardial effusion. Mitral Valve: The mitral valve is normal in structure. There is mild calcification of the mitral valve leaflet(s). Mild mitral annular calcification. Mild mitral valve regurgitation. No evidence of mitral valve stenosis. Tricuspid Valve: The tricuspid valve is normal in structure. Tricuspid valve regurgitation is trivial. Aortic Valve: The aortic valve is tricuspid. There is mild calcification of the aortic valve. Aortic valve regurgitation is trivial. No aortic stenosis is present. Aortic valve mean gradient measures 3.0 mmHg. Aortic valve peak gradient measures 5.3 mmHg. Aortic valve area, by VTI measures 2.90 cm. Pulmonic Valve: The pulmonic valve was normal in structure. Pulmonic valve regurgitation is not visualized. Aorta: The aortic root is normal in size and structure. Venous: IVC not visualized. IAS/Shunts: No atrial level shunt detected by color flow Doppler.  LEFT VENTRICLE PLAX 2D                        Biplane EF (MOD) LVIDd:         4.20 cm         LV Biplane EF:   Left LVIDs:         3.10 cm                          ventricular LV PW:         0.90 cm                          ejection LV IVS:        0.90 cm                          fraction by LVOT diam:     2.30 cm                          2D  MOD LV SV:         77                               biplane is LV SV Index:   42                               55.2 %. LVOT Area:     4.15 cm  Diastology                                LV e' medial:    8.81 cm/s LV Volumes (MOD)               LV E/e' medial:  12.5 LV vol d, MOD    68.1 ml       LV e' lateral:   10.90 cm/s A2C:                           LV E/e' lateral: 10.1 LV vol d, MOD    83.5 ml A4C: LV vol s, MOD    31.3 ml A2C: LV vol s, MOD    34.2 ml A4C: LV SV MOD A2C:   36.8 ml LV SV MOD A4C:   83.5 ml LV SV MOD BP:    41.9 ml RIGHT VENTRICLE RV Basal diam:  3.80 cm RV Mid diam:    3.20 cm RV S prime:     12.90 cm/s TAPSE (M-mode): 2.2 cm LEFT ATRIUM             Index        RIGHT ATRIUM           Index LA diam:        3.80 cm 2.08 cm/m   RA Area:     13.90 cm LA Vol (A2C):   80.1 ml 43.74 ml/m  RA Volume:   36.00 ml  19.66 ml/m LA Vol (A4C):   44.3 ml 24.19 ml/m LA Biplane Vol: 62.1 ml 33.91 ml/m  AORTIC VALVE AV Area (Vmax):    3.13 cm AV Area (Vmean):   2.75 cm AV Area (VTI):     2.90 cm AV Vmax:           115.00 cm/s AV Vmean:          86.900 cm/s AV VTI:            0.265 m AV Peak Grad:      5.3 mmHg AV Mean Grad:      3.0 mmHg LVOT Vmax:         86.60 cm/s LVOT Vmean:        57.500 cm/s LVOT VTI:          0.185 m LVOT/AV VTI ratio: 0.70  AORTA Ao Root diam: 3.30 cm Ao Asc diam:  3.40 cm MITRAL VALVE                TRICUSPID VALVE MV Area (PHT): 4.49 cm     TR Peak grad:   17.6 mmHg MV Decel Time: 169 msec     TR Vmax:        210.00 cm/s MV E velocity: 110.00 cm/s MV A velocity: 84.60 cm/s   SHUNTS MV E/A ratio:  1.30         Systemic VTI:  0.18 m                             Systemic Diam: 2.30 cm Dalton McleanMD Electronically signed by Archer Bear Signature Date/Time: 07/18/2023/2:36:15 PM    Final    CT CORONARY FFR DATA PREP & FLUID ANALYSIS Result Date: 07/18/2023 EXAM: CT FFR ANALYSIS CLINICAL DATA:  Chest pain FINDINGS: FFRct analysis was performed on  the original cardiac  CT angiogram dataset. Diagrammatic representation of the FFRct analysis is provided in a separate PDF document in PACS. This dictation was created using the PDF document and an interactive 3D model of the results. 3D model is not available in the EMR/PACS. Normal FFR range is >0.80. Indeterminate (grey) zone is 0.76-0.80. FFR delta of 0.13 is considered significant. 1. Left Main: FFR = 1.0 2. LAD: Proximal FFR = 0.84, Mid FFR = 0.79, Distal FFR = 0.72 3. D1: Not modeled due to small caliber vessel. 4. D2: Proximal FFR = 0.78, Distal FFR = not modeled due to small caliber vessel 5. D3: Proximal FFR = Not modeled due to small caliber vessel. 6. LCX: Proximal FFR = 0.84, Distal FFR = 0.79 7. OM1: Not modeled due to small caliber vessel. 8. OM2: Proximal FFR = 0.78, Distal FFR = 0.74 9. OM3: Not modeled due to small caliber vessel. 10. RCA: Proximal FFR = 0.99, Mid FFR =0.90, Distal FFR = 0.88 Plaque Analysis: Calcified plaque 151 mm3 Non-calcified plaque 691 mm3 (low attenuation plaque 11 mm3). Total plaque volume (TPV) is 842 mm3, categorized as extensive, which is 61st percentile for age- and sex-matched controls. IMPRESSION: 1. CT FFR analysis illustrates significant stenosis either at the distal left main or ostial LAD/LCX bifurcation. The delta FFR is greater than 0.13 which is considered significant. 2. Mid to distal CT FFR values in the LAD and LCX are significant as well; however, this is likely due to flow degradation, clinical correlation required. 3. Total plaque volume (TPV) is 842 mm3, categorized as extensive, which is 61st percentile for age- and sex-matched controls. RECOMMENDATIONS: Invasive angiography is warranted if no contraindication to further evaluate the distal left main as it bifurcates into the LAD / LCx. Guideline-directed medical therapy and aggressive risk factor modification for secondary prevention of coronary artery disease. Results conveyed telephonically to  cardiology rounding team, physician extender. Electronically Signed   By: Olinda Bertrand   On: 07/18/2023 09:45     I have independently reviewed the above radiologic studies and discussed with the patient   Recent Lab Findings: Lab Results  Component Value Date   WBC 5.7 07/19/2023   HGB 12.8 (L) 07/19/2023   HCT 38.2 (L) 07/19/2023   PLT 122 (L) 07/19/2023   GLUCOSE 101 (H) 07/19/2023   CHOL 158 07/19/2023   TRIG 259 (H) 07/19/2023   HDL 21 (L) 07/19/2023   LDLCALC 85 07/19/2023   ALT 24 07/17/2023   AST 29 07/17/2023   NA 137 07/19/2023   K 4.2 07/19/2023   CL 108 07/19/2023   CREATININE 1.53 (H) 07/19/2023   BUN 38 (H) 07/19/2023   CO2 21 (L) 07/19/2023   HGBA1C 6.1 (H) 07/17/2023   Assessment / Plan:   Coronary artery disease-Dr. Luna Salinas to determine if candidate for CABG and timing if a candidate History of hypertension-on Amlodipine 5 mg daily, Carvedilol 6.25 mg bid History of atrial fibrillation 4. History of CKD (stage III)-creatinine today decreased to 1.53. Prior to admission, creatinine was 1.45. Avoid nephrotoxic agents 5. History of hyperlipidemia-on Rosuvastatin 20 mg daily 6. History of pre diabetes-HGA1C 6.1. He has mostly been diet controlled. He was on Metformin prior to admission as HGA1C closer to diabetes of late 7. Thrombocytopenia-platelets today slightly decreased to 122,000 8. History of anemia-H and H this am. On Ferrous sulfate. 9. History of chronic UTIs (has urostomy) -on Ceftriaxone  10. On Lovenox for DVT prophylaxis  I  spent 25 minutes counseling the patient face to  face.   Donielle Zimmerman PA-C 07/19/2023 4:33 PM  I have seen and examined Mr. Bruntz.  I reviewed his records and cath images.  I concur with the findings noted above.  73 year-old man with a history of hypertension, hyperlipidemia, type II diabetes without complication, stage IIIA CKD, history of bladder cancer with urostomy, recurrent UTI, history of colon cancer and  history of atrial fibrillation about 10 years ago.  He presented with unstable CP and had minimally elevated high sensitivity troponin.  At cath found to have a 65% distal left main stenosis.  CABG indicated for survival benefit and relief of symptoms.  I discussed the general nature of the procedure, including the need for general anesthesia, the incisions to be used, the use of cardiopulmonary bypass, and the use of temporary pacemaker wires and drainage tubes postoperatively with Mr. Van.  We discussed the expected hospital stay, overall recovery and short and long term outcomes. I informed him of the indications, risks, benefits and alternatives.   He understands the risks include, but are not limited to death, stroke, MI, DVT/PE, bleeding, possible need for transfusion, infections, cardiac arrhythmias, as well as other organ system dysfunction including respiratory, renal, or GI complications.    He accepts the risks and agrees to proceed.  For CABG Friday AM  Landon Pinion C. Luna Salinas, MD [facility] [phone]

## 2023-07-19 NOTE — Progress Notes (Signed)
  Progress Note  Patient Name: Mark [name] Date of Encounter: 07/19/2023 [facility] Cardiologist: Peter Swaziland, MD   Interval Summary    Patient pending LHC today, is feeling well without focal symptoms. No more chest pain since starting Coreg yesterday. BP is also better controlled.   Vital Signs Vitals:   07/18/23 2032 07/19/23 0035 07/19/23 0410 07/19/23 0747  BP: 100/61 118/63  120/69  Pulse: 70 68 68   Resp: 18 20 20 19   Temp: 98.2 F (36.8 C) 98 F (36.7 C) 98.2 F (36.8 C) 98.1 F (36.7 C)  TempSrc: Oral Oral Oral Oral  SpO2: 96% 98% 98%   Weight:   75.3 kg   Height:        Intake/Output Summary (Last 24 hours) at 07/19/2023 0821 Last data filed at 07/19/2023 0600 Gross per 24 hour  Intake 359.64 ml  Output --  Net 359.64 ml      07/19/2023    4:10 AM 07/18/2023    5:21 AM 07/16/2023    5:14 PM  Last 3 Weights  Weight (lbs) 166 lb 166 lb 12.8 oz 168 lb 11.2 oz  Weight (kg) 75.297 kg 75.66 kg 76.522 kg      Telemetry/ECG  Sinus rhythm with HR in the 60s. No afib - Personally Reviewed  Physical Exam  GEN: No acute distress.   Neck: No JVD Cardiac: RRR, no murmurs, rubs, or gallops.  Respiratory: Clear to auscultation bilaterally. GI: Soft, nontender, non-distended  MS: No edema  Assessment & Plan  Accelerating angina Patient with recent symptoms of chest pressure radiating to jaw and arms. Pain was nitroglycerin responsive. Of note, does also have hiatal hernia. Troponin and ECG not suggestive of ACS but symptoms are certainly concerning. CTA chest with score of 891. Moderate 3 vessel CAD. FFR limited by calcium but illustrates significant stenosis either at the distal left main or ostial LAD/LCX bifurcation. The delta FFR is greater than 0.13 which is considered significant. TTE with preserved LVEF, normal wall motion. Symptoms combined with CTA findings concerning for obstructive CAD. Patient will need LHC. Creatinine back to near baseline, LHC  planned for today.  Continue Coreg 6.25mg  BID Continue ASA 81mg  Continue Crestor 20mg   Hypertension Amlodipine 5mg  added this admission, Coreg started yesterday with BP now well controlled.  Continue Amlodipine 5mg  Continue Coreg 6.25mg  BID     Hx atrial fibrillation Remote history of this per patient, has not been on OAC with lack of recurrent episodes.  Monitor inpatient telemetry  Per primary team: UTI Prediabetes    For questions or updates, please contact [facility] Please consult [email or web address] for contact info under       Signed, Leala Prince, PA-C

## 2023-07-19 NOTE — Progress Notes (Signed)
 PROGRESS NOTE    Mark [name]  NAT:[id number] DOB: [birth year] DOA: 07/16/2023 PCP: Benedetto Brady, MD   Brief Narrative:  73 y.o. male with medical history significant of paroxysmal A-fib not on anticoagulation, chronic kidney disease stage IIIa, bladder cancer status post urostomy, colon cancer in remission, GERD, hypertension, hyperlipidemia, prediabetes presented with chest pain.  On presentation, high-sensitivity troponins were 18 and 23. EKG was nonischemic. UA was suggestive of UTI.  He was started on IV antibiotics.  Cardiology was consulted.  Coronary CTA showed moderate three-vessel CAD with high calcium score.  Cardiology planning for cardiac catheterization today.  Assessment & Plan:  Chest pain - Questionable cause.  High-sensitivity troponins did not trend upwards.  EKG nonischemic.   -Cardiology following.  Coronary CTA showed moderate three-vessel CAD and coronary calcium score of 891 with other abnormalities.  Continue aspirin.  2D echo showed EF of 55 to 60% with mild mitral valve regurgitation  - Cardiology planning for cardiac catheterization today.  hypertension -blood pressure currently stable.  Continue amlodipine and Coreg as per cardiology.  Paroxysmal A-fib - Remote history.  Not on anticoagulation currently.  Currently rate controlled   Prediabetes -CBGs with SSI.  A1c 6.1.  Possible UTI: Present on admission; possibly related to urostomy -Continue Rocephin .  Urine cultures growing providencia  History of bladder cancer status post urostomy - Outpatient follow-up with urology     DVT prophylaxis: Lovenox Code Status: DNR Family Communication: None at bedside Disposition Plan: Status is: Observation Will possibly need to change to inpatient for continuation of IV antibiotics.  Consultants: Cardiology  Procedures: None  Antimicrobials: Rocephin  from 07/16/2023 onwards   Subjective: Patient seen and examined at bedside.  Denies worsening shortness  of breath, fever or vomiting.   Objective: Vitals:   07/18/23 2032 07/19/23 0035 07/19/23 0410 07/19/23 0747  BP: 100/61 118/63  120/69  Pulse: 70 68 68   Resp: 18 20 20 19   Temp: 98.2 F (36.8 C) 98 F (36.7 C) 98.2 F (36.8 C) 98.1 F (36.7 C)  TempSrc: Oral Oral Oral Oral  SpO2: 96% 98% 98%   Weight:   75.3 kg   Height:        Intake/Output Summary (Last 24 hours) at 07/19/2023 0822 Last data filed at 07/19/2023 0600 Gross per 24 hour  Intake 359.64 ml  Output --  Net 359.64 ml   Filed Weights   07/16/23 1714 07/18/23 0521 07/19/23 0410  Weight: 76.5 kg 75.7 kg 75.3 kg    Examination:  General: No distress.  Remains on room air. ENT/neck: No obvious JVD elevation or palpable neck masses noted respiratory: Bilateral decreased breath sound in bases with scattered crackles CVS: Rate currently controlled; S1 and S2 are heard  abdominal: Soft, nontender, distended mildly; no organomegaly, bowel sounds are heard normally.  Urostomy is present Extremities: No clubbing; mild lower extremity edema present CNS: Alert and oriented.  No focal neurologic deficit.  Able to move extremities Lymph: No obvious palpable lymphadenopathy Skin: No obvious petechia/rashes  psych: Judgment, affect and mood are normal musculoskeletal: No obvious joint tenderness erythema    Data Reviewed: I have personally reviewed following labs and imaging studies  CBC: Recent Labs  Lab 07/16/23 1143 07/17/23 0446 07/18/23 0519 07/19/23 0602  WBC 6.7 3.8* 9.8 5.7  NEUTROABS  --  2.7 7.8* 2.7  HGB 13.7 13.4 13.1 12.8*  HCT 41.1 40.1 39.8 38.2*  MCV 87.1 85.7 87.1 85.5  PLT 160 138* 137* 122*   Basic  Metabolic Panel: Recent Labs  Lab 07/16/23 1345 07/17/23 0446 07/18/23 0519 07/19/23 0602  NA 140 136 133* 137  K 4.1 4.6 4.2 4.2  CL 104 103 103 108  CO2 24 22 20* 21*  GLUCOSE 112* 157* 135* 101*  BUN 24* 26* 36* 38*  CREATININE 1.45* 1.49* 1.67* 1.53*  CALCIUM 10.6* 10.1 9.0 8.7*  MG   --  1.8 2.0 1.9   GFR: Estimated Creatinine Clearance: 40.7 mL/min (A) (by C-G formula based on SCr of 1.53 mg/dL (H)). Liver Function Tests: Recent Labs  Lab 07/17/23 0446  AST 29  ALT 24  ALKPHOS 88  BILITOT 0.9  PROT 7.4  ALBUMIN 3.3*   No results for input(s): "LIPASE", "AMYLASE" in the last 168 hours. No results for input(s): "AMMONIA" in the last 168 hours. Coagulation Profile: No results for input(s): "INR", "PROTIME" in the last 168 hours. Cardiac Enzymes: No results for input(s): "CKTOTAL", "CKMB", "CKMBINDEX", "TROPONINI" in the last 168 hours. BNP (last 3 results) No results for input(s): "PROBNP" in the last 8760 hours. HbA1C: Recent Labs    07/17/23 0446  HGBA1C 6.1*   CBG: Recent Labs  Lab 07/18/23 0837 07/18/23 1229 07/18/23 1700 07/18/23 2058 07/19/23 0749  GLUCAP 117* 197* 90 136* 99   Lipid Profile: Recent Labs    07/19/23 0602  CHOL 158  HDL 21*  LDLCALC 85  TRIG 308*  CHOLHDL 7.5   Thyroid Function Tests: No results for input(s): "TSH", "T4TOTAL", "FREET4", "T3FREE", "THYROIDAB" in the last 72 hours. Anemia Panel: No results for input(s): "VITAMINB12", "FOLATE", "FERRITIN", "TIBC", "IRON", "RETICCTPCT" in the last 72 hours. Sepsis Labs: No results for input(s): "PROCALCITON", "LATICACIDVEN" in the last 168 hours.  Recent Results (from the past 240 hours)  Urine Culture     Status: Abnormal (Preliminary result)   Collection Time: 07/16/23  1:25 PM   Specimen: Urine, Random  Result Value Ref Range Status   Specimen Description URINE, RANDOM  Final   Special Requests NONE Reflexed from [id number]  Final   Culture (A)  Final    >=100,000 COLONIES/mL PROVIDENCIA STUARTII 60,000 COLONIES/mL PROVIDENCIA RETTGERI SUSCEPTIBILITIES TO FOLLOW Performed at [facility], [address] [place], [place] [address]    Report Status PENDING  Incomplete   Organism ID, Bacteria PROVIDENCIA STUARTII (A)  Final      Susceptibility    Providencia stuartii - MIC*    AMPICILLIN RESISTANT Resistant     CEFEPIME <=0.12 SENSITIVE Sensitive     CEFTRIAXONE  <=0.25 SENSITIVE Sensitive     CIPROFLOXACIN <=0.25 SENSITIVE Sensitive     GENTAMICIN RESISTANT Resistant     IMIPENEM 2 SENSITIVE Sensitive     NITROFURANTOIN >=512 RESISTANT Resistant     TRIMETH /SULFA  <=20 SENSITIVE Sensitive     AMPICILLIN/SULBACTAM 16 INTERMEDIATE Intermediate     PIP/TAZO <=4 SENSITIVE Sensitive ug/mL    * >=100,000 COLONIES/mL PROVIDENCIA STUARTII         Radiology Studies: ECHOCARDIOGRAM COMPLETE Result Date: 07/18/2023    ECHOCARDIOGRAM REPORT   Patient Name:   Mark [name] Date of Exam: 07/18/2023 Medical Rec #:  [id number]       Height:       65.0 in Accession #:    [id number]      Weight:       166.8 lb Date of Birth:  [birth year]       BSA:          1.831 m Patient Age:    40  years        BP:           132/70 mmHg Patient Gender: M               HR:           73 bpm. Exam Location:  Inpatient Procedure: 2D Echo, Color Doppler and Cardiac Doppler (Both Spectral and Color            Flow Doppler were utilized during procedure). Indications:    Chest pain  History:        Patient has prior history of Echocardiogram examinations and                 Patient has no prior history of Echocardiogram examinations.                 Signs/Symptoms:Chest Pain.  Sonographer:    Jeralene Mom Referring Phys: [id number] CAMERON T LAMBERT IMPRESSIONS  1. Left ventricular ejection fraction, by estimation, is 55 to 60%. Left ventricular ejection fraction by 2D MOD biplane is 55.2 %. The left ventricle has normal function. The left ventricle has no regional wall motion abnormalities. Left ventricular diastolic parameters were normal.  2. Peak RV-RA gradient 17 mmHg. Right ventricular systolic function is normal. The right ventricular size is normal.  3. Left atrial size was mildly dilated.  4. The mitral valve is normal in structure. Mild mitral valve regurgitation. No  evidence of mitral stenosis.  5. The aortic valve is tricuspid. There is mild calcification of the aortic valve. Aortic valve regurgitation is trivial. No aortic stenosis is present.  6. IVC not visualized. FINDINGS  Left Ventricle: Left ventricular ejection fraction, by estimation, is 55 to 60%. Left ventricular ejection fraction by 2D MOD biplane is 55.2 %. The left ventricle has normal function. The left ventricle has no regional wall motion abnormalities. The left ventricular internal cavity size was normal in size. There is no left ventricular hypertrophy. Left ventricular diastolic parameters were normal. Right Ventricle: Peak RV-RA gradient 17 mmHg. The right ventricular size is normal. No increase in right ventricular wall thickness. Right ventricular systolic function is normal. Left Atrium: Left atrial size was mildly dilated. Right Atrium: Right atrial size was normal in size. Pericardium: There is no evidence of pericardial effusion. Mitral Valve: The mitral valve is normal in structure. There is mild calcification of the mitral valve leaflet(s). Mild mitral annular calcification. Mild mitral valve regurgitation. No evidence of mitral valve stenosis. Tricuspid Valve: The tricuspid valve is normal in structure. Tricuspid valve regurgitation is trivial. Aortic Valve: The aortic valve is tricuspid. There is mild calcification of the aortic valve. Aortic valve regurgitation is trivial. No aortic stenosis is present. Aortic valve mean gradient measures 3.0 mmHg. Aortic valve peak gradient measures 5.3 mmHg. Aortic valve area, by VTI measures 2.90 cm. Pulmonic Valve: The pulmonic valve was normal in structure. Pulmonic valve regurgitation is not visualized. Aorta: The aortic root is normal in size and structure. Venous: IVC not visualized. IAS/Shunts: No atrial level shunt detected by color flow Doppler.  LEFT VENTRICLE PLAX 2D                        Biplane EF (MOD) LVIDd:         4.20 cm         LV Biplane  EF:   Left LVIDs:         3.10 cm  ventricular LV PW:         0.90 cm                          ejection LV IVS:        0.90 cm                          fraction by LVOT diam:     2.30 cm                          2D MOD LV SV:         77                               biplane is LV SV Index:   42                               55.2 %. LVOT Area:     4.15 cm                                Diastology                                LV e' medial:    8.81 cm/s LV Volumes (MOD)               LV E/e' medial:  12.5 LV vol d, MOD    68.1 ml       LV e' lateral:   10.90 cm/s A2C:                           LV E/e' lateral: 10.1 LV vol d, MOD    83.5 ml A4C: LV vol s, MOD    31.3 ml A2C: LV vol s, MOD    34.2 ml A4C: LV SV MOD A2C:   36.8 ml LV SV MOD A4C:   83.5 ml LV SV MOD BP:    41.9 ml RIGHT VENTRICLE RV Basal diam:  3.80 cm RV Mid diam:    3.20 cm RV S prime:     12.90 cm/s TAPSE (M-mode): 2.2 cm LEFT ATRIUM             Index        RIGHT ATRIUM           Index LA diam:        3.80 cm 2.08 cm/m   RA Area:     13.90 cm LA Vol (A2C):   80.1 ml 43.74 ml/m  RA Volume:   36.00 ml  19.66 ml/m LA Vol (A4C):   44.3 ml 24.19 ml/m LA Biplane Vol: 62.1 ml 33.91 ml/m  AORTIC VALVE AV Area (Vmax):    3.13 cm AV Area (Vmean):   2.75 cm AV Area (VTI):     2.90 cm AV Vmax:           115.00 cm/s AV Vmean:          86.900 cm/s AV VTI:            0.265 m AV Peak Grad:  5.3 mmHg AV Mean Grad:      3.0 mmHg LVOT Vmax:         86.60 cm/s LVOT Vmean:        57.500 cm/s LVOT VTI:          0.185 m LVOT/AV VTI ratio: 0.70  AORTA Ao Root diam: 3.30 cm Ao Asc diam:  3.40 cm MITRAL VALVE                TRICUSPID VALVE MV Area (PHT): 4.49 cm     TR Peak grad:   17.6 mmHg MV Decel Time: 169 msec     TR Vmax:        210.00 cm/s MV E velocity: 110.00 cm/s MV A velocity: 84.60 cm/s   SHUNTS MV E/A ratio:  1.30         Systemic VTI:  0.18 m                             Systemic Diam: 2.30 cm Dalton McleanMD Electronically  signed by Archer Bear Signature Date/Time: 07/18/2023/2:36:15 PM    Final    CT CORONARY FFR DATA PREP & FLUID ANALYSIS Result Date: 07/18/2023 EXAM: CT FFR ANALYSIS CLINICAL DATA:  Chest pain FINDINGS: FFRct analysis was performed on the original cardiac CT angiogram dataset. Diagrammatic representation of the FFRct analysis is provided in a separate PDF document in PACS. This dictation was created using the PDF document and an interactive 3D model of the results. 3D model is not available in the EMR/PACS. Normal FFR range is >0.80. Indeterminate (grey) zone is 0.76-0.80. FFR delta of 0.13 is considered significant. 1. Left Main: FFR = 1.0 2. LAD: Proximal FFR = 0.84, Mid FFR = 0.79, Distal FFR = 0.72 3. D1: Not modeled due to small caliber vessel. 4. D2: Proximal FFR = 0.78, Distal FFR = not modeled due to small caliber vessel 5. D3: Proximal FFR = Not modeled due to small caliber vessel. 6. LCX: Proximal FFR = 0.84, Distal FFR = 0.79 7. OM1: Not modeled due to small caliber vessel. 8. OM2: Proximal FFR = 0.78, Distal FFR = 0.74 9. OM3: Not modeled due to small caliber vessel. 10. RCA: Proximal FFR = 0.99, Mid FFR =0.90, Distal FFR = 0.88 Plaque Analysis: Calcified plaque 151 mm3 Non-calcified plaque 691 mm3 (low attenuation plaque 11 mm3). Total plaque volume (TPV) is 842 mm3, categorized as extensive, which is 61st percentile for age- and sex-matched controls. IMPRESSION: 1. CT FFR analysis illustrates significant stenosis either at the distal left main or ostial LAD/LCX bifurcation. The delta FFR is greater than 0.13 which is considered significant. 2. Mid to distal CT FFR values in the LAD and LCX are significant as well; however, this is likely due to flow degradation, clinical correlation required. 3. Total plaque volume (TPV) is 842 mm3, categorized as extensive, which is 61st percentile for age- and sex-matched controls. RECOMMENDATIONS: Invasive angiography is warranted if no contraindication to  further evaluate the distal left main as it bifurcates into the LAD / LCx. Guideline-directed medical therapy and aggressive risk factor modification for secondary prevention of coronary artery disease. Results conveyed telephonically to cardiology rounding team, physician extender. Electronically Signed   By: Olinda Bertrand   On: 07/18/2023 09:45   CT CORONARY MORPH W/CTA COR W/SCORE W/CA W/CM &/OR WO/CM Addendum Date: 07/17/2023 ADDENDUM REPORT: 07/17/2023 19:02 HISTORY: Chest pain/anginal equiv, ECGs or troponins abnormal EXAM: Cardiac/Coronary  CT  TECHNIQUE: The patient was scanned on a [employer]. PROTOCOL: A non-contrast, gated CT scan was obtained with axial slices of 3 mm through the heart for calcium scoring. Calcium scoring was performed using the Agatston method. A 120 kV prospective, gated, contrast cardiac scan was obtained. Gantry rotation speed was 250 msecs and collimation was 0.6 mm. Two sublingual nitroglycerin tablets (0.8 mg) were given. The 3 D data set was reconstructed in 5% intervals of the 35-75% of the R-R cycle. Diastolic phases were analyzed on a dedicated workstation using MPR, MIP, and VRT modes. The patient received 95 cc of contrast. FINDINGS: Image quality: Average. Artifact: Limited-blooming artifact Coronary artery calcification score: Left main: 11 Left anterior descending artery: 134 Left circumflex artery: 204 Right coronary artery: 542 Total coronary calcium score of 891, places the patient at the 79th percentile for age and sex matched control. Coronary arteries: Normal coronary origins.  Right dominance. Left Main Coronary Artery: Normal caliber vessel, originates from the left coronary cusp, bifurcates to form a left anterior descending artery (LAD) and a left circumflex artery (LCX). Mild stenosis (25-49%) due to mixed plaque at distal left main. Left Anterior Descending Artery: Normal caliber vessel, wraps the apex, gives off 3 diagonal branches. Minimal luminal  irregularities (<25%) due to mixed plaque at the ostial segment. Moderate stenosis (50-69%) due to calcified plaque at proximal LAD, minimal irregularities due to calcified plaque in mid LAD otherwise patent. First Diagonal branch: Small caliber vessel patent Second Diagonal branch: Normal caliber vessel, patent Third Diagonal branch: Small caliber, patent Left Circumflex Artery: Normal caliber vessel, non-dominant, travels within the atrioventricular groove, terminates as the third obtuse marginal branch. Moderate stenosis (50-69%) due to calcified plaque at the proximal to mid LCx and mid to distal segments are patent. First Obtuse Marginal branch: Small caliber, patent Second Obtuse Marginal branch: Mild stenosis (25-49%) due to calcified plaque at ostial to proximal segment, otherwise patent Third Obtuse Marginal branch: Patent Right Coronary Artery: Dominant vessel, originates from the right coronary cusp, terminates as a PDA and right posterolateral branch. Moderate stenosis (50-69%) due to calcified plaque at proximal RCA. Diffuse disease due to mixed plaque within the proximal to distal segments. PDA is patent. Moderate stenosis (50-69%) closer to 50% due to mixed plaque at the ostial PLB branch otherwise mid/distal segment is patent. Left Atrium: Grossly normal in size with no left atrial appendage filling defect. Left Ventricle: Grossly normal in size. There is no abnormal filling defect. Pulmonary arteries: Normal in size without proximal filling defect. Pulmonary veins: Normal pulmonary venous drainage. Aorta: Normal size, 33.5 mm at the mid ascending aorta (level of the PA bifurcation) measured double oblique. Aortic atherosclerosis. No dissection. Pericardium: Normal thickness with no significant effusion or calcium present. Aortic Valve:Trileaflet without significant calcification. Mitral Valve: Normal structure without significant calcification. Extra-cardiac findings: See attached radiology report  for non-cardiac structures. IMPRESSION: 1. Coronary calcium score of 891. This was 79th percentile for age-, sex, and race-matched controls. 2. Study submitted to [facility] but plaque analysis data is unavailable at the time of report completion. 3. Normal coronary origin with right dominance. 4. Aortic atherosclerosis. 5. CAD-RADS = 3 Moderate coronary artery disease. 6. Mild stenosis (25-49%) due to mixed plaque at distal left main. 7. Moderate stenosis (50-69%) due to calcified plaque at proximal LAD. 8. Moderate stenosis (50-69%) due to calcified plaque at the proximal to mid LCx. 9. Moderate stenosis (50-69%) due to calcified plaque at proximal RCA. 10. Moderate stenosis (50-69%) closer  to 50% due to mixed plaque at the ostial PLB branch otherwise distal segment is patent. 11. Study is sent for CT-FFR to further evaluate LAD, LCx, and RCA /PLB. Of note disease severity based on CCTA may be overestimated due to calcified plaque causing blooming artifact. CT-FFR findings will be performed and reported separately. Study has been submitted to [facility] but processing is pending. RECOMMENDATIONS: Consider symptom-guided anti-ischemic pharmacotherapy as well as risk factor modification per guideline directed care. Additional analysis with CT FFR will be submitted. Electronically Signed   By: Olinda Bertrand   On: 07/17/2023 19:02   Result Date: 07/17/2023 EXAM: OVER-READ INTERPRETATION  CT CHEST The following report is a limited chest CT over-read performed by radiologist Dr. Nicanor Barge of [facility] on 07/17/2023. This over-read does not include interpretation of cardiac or coronary anatomy or pathology. The coronary CTA interpretation by the cardiologist is attached. COMPARISON:  None Available. FINDINGS: No suspicious nodules, masses, or infiltrates are identified in the visualized portion of the lungs. No pleural fluid seen. The visualized portions of the mediastinum and hilar regions are  unremarkable. IMPRESSION: No significant non-cardiac abnormality. Electronically Signed: By: Marlyce Sine M.D. On: 07/17/2023 10:37        Scheduled Meds:  amLODipine  5 mg Oral Daily   aspirin EC  81 mg Oral Daily   carvedilol  6.25 mg Oral BID WC   DULoxetine   30 mg Oral Daily   enoxaparin (LOVENOX) injection  40 mg Subcutaneous Q24H   insulin aspart  0-15 Units Subcutaneous TID WC   insulin aspart  0-5 Units Subcutaneous QHS   pantoprazole   40 mg Oral Daily   rosuvastatin  20 mg Oral Daily   Continuous Infusions:  sodium chloride  1 mL/kg/hr (07/19/23 0600)   cefTRIAXone  (ROCEPHIN )  IV 1 g (07/19/23 0800)          Audria Leather, MD [facility] 07/19/2023, 8:22 AM

## 2023-07-20 DIAGNOSIS — R079 Chest pain, unspecified: Secondary | ICD-10-CM | POA: Diagnosis not present

## 2023-07-20 LAB — URINE CULTURE: Culture: >=100000 — AB

## 2023-07-20 LAB — CBC WITH DIFFERENTIAL/PLATELET
Abs Immature Granulocytes: 0.03 10*3/uL (ref 0.00–0.07)
Basophils Absolute: 0 10*3/uL (ref 0.0–0.1)
Basophils Relative: 0 %
Eosinophils Absolute: 0 10*3/uL (ref 0.0–0.5)
Eosinophils Relative: 0 %
HCT: 34.9 % — ABNORMAL LOW (ref 39.0–52.0)
Hemoglobin: 12 g/dL — ABNORMAL LOW (ref 13.0–17.0)
Immature Granulocytes: 1 %
Lymphocytes Relative: 17 %
Lymphs Abs: 0.8 10*3/uL (ref 0.7–4.0)
MCH: 29.1 pg (ref 26.0–34.0)
MCHC: 34.4 g/dL (ref 30.0–36.0)
MCV: 84.5 fL (ref 80.0–100.0)
Monocytes Absolute: 0.1 10*3/uL (ref 0.1–1.0)
Monocytes Relative: 2 %
Neutro Abs: 3.7 10*3/uL (ref 1.7–7.7)
Neutrophils Relative %: 80 %
Platelets: 101 10*3/uL — ABNORMAL LOW (ref 150–400)
RBC: 4.13 MIL/uL — ABNORMAL LOW (ref 4.22–5.81)
RDW: 13.4 % (ref 11.5–15.5)
WBC: 4.6 10*3/uL (ref 4.0–10.5)
nRBC: 0 % (ref 0.0–0.2)

## 2023-07-20 LAB — BASIC METABOLIC PANEL WITH GFR
Anion gap: 10 (ref 5–15)
BUN: 40 mg/dL — ABNORMAL HIGH (ref 8–23)
CO2: 18 mmol/L — ABNORMAL LOW (ref 22–32)
Calcium: 8.4 mg/dL — ABNORMAL LOW (ref 8.9–10.3)
Chloride: 107 mmol/L (ref 98–111)
Creatinine, Ser: 1.42 mg/dL — ABNORMAL HIGH (ref 0.61–1.24)
GFR, Estimated: 52 mL/min — ABNORMAL LOW (ref >60)
Glucose, Bld: 157 mg/dL — ABNORMAL HIGH (ref 70–99)
Potassium: 3.9 mmol/L (ref 3.5–5.1)
Sodium: 135 mmol/L (ref 135–145)

## 2023-07-20 LAB — MAGNESIUM: Magnesium: 2 mg/dL (ref 1.7–2.4)

## 2023-07-20 LAB — GLUCOSE, CAPILLARY
Glucose-Capillary: 147 mg/dL — ABNORMAL HIGH (ref 70–99)
Glucose-Capillary: 202 mg/dL — ABNORMAL HIGH (ref 70–99)
Glucose-Capillary: 147 mg/dL — ABNORMAL HIGH (ref 70–99)
Glucose-Capillary: 198 mg/dL — ABNORMAL HIGH (ref 70–99)

## 2023-07-20 MED ORDER — ROSUVASTATIN CALCIUM 20 MG PO TABS
40.0000 mg | ORAL_TABLET | Freq: Every day | ORAL | Status: DC
  Administered 2023-07-21 – 2023-08-03 (×13): 40 mg via ORAL
  Filled 2023-07-20 (×13): qty 2

## 2023-07-20 MED ORDER — POLYETHYLENE GLYCOL 3350 17 G PO PACK
17.0000 g | PACK | Freq: Every day | ORAL | Status: DC
  Administered 2023-07-20 – 2023-07-21 (×2): 17 g via ORAL
  Filled 2023-07-20 (×2): qty 1

## 2023-07-20 NOTE — Plan of Care (Signed)
  Problem: Activity: Goal: Ability to tolerate increased activity will improve Outcome: Progressing   Problem: Cardiac: Goal: Ability to achieve and maintain adequate cardiovascular perfusion will improve Outcome: Progressing   Problem: Coping: Goal: Ability to adjust to condition or change in health will improve Outcome: Progressing   Problem: Fluid Volume: Goal: Ability to maintain a balanced intake and output will improve Outcome: Progressing   Problem: Nutritional: Goal: Maintenance of adequate nutrition will improve Outcome: Progressing

## 2023-07-20 NOTE — Progress Notes (Signed)
 PROGRESS NOTE  Mark [name] BJY:[id number] DOB: [birth year] DOA: 07/16/2023 PCP: Benedetto Brady, MD   LOS: 1 day   Brief Narrative / Interim history: 73 y.o. male with medical history significant of paroxysmal A-fib not on anticoagulation, chronic kidney disease stage IIIa, bladder cancer status post urostomy, colon cancer in remission, GERD, hypertension, hyperlipidemia, prediabetes presented with chest pain.  On presentation, high-sensitivity troponins were 18 and 23. EKG was nonischemic. UA was suggestive of UTI.  He was started on IV antibiotics.  Cardiology was consulted.  Coronary CTA showed moderate three-vessel CAD with high calcium score. He underwent coronary cath on 6/3 showing Dist LM to Ost LAD lesion is 65% stenosed. 65% distal left main lesion with mild diffuse disease of the LAD and left circumflex.  Is being evaluated by CT surgery for CABG   Subjective / 24h Interval events: Doing well, no chest pain, no palpitations  Assesement and Plan: Principal problem Chest pain, CAD - Cardiology following.  Coronary CTA showed moderate three-vessel CAD and coronary calcium score of 891 with other abnormalities.  Continue aspirin.  2D echo showed EF of 55 to 60% with mild mitral valve regurgitation  - he underwent cardiac cath yesterday showing Dist LM to Ost LAD lesion is 65% stenosed. 65% distal left main lesion with mild diffuse disease of the LAD and left circumflex. -awaiting formal cardiothoracic evaluation for potential CABG   Active problems Hypertension - BP stable.  Continue amlodipine and Coreg as per cardiology.   Paroxysmal A-fib - Remote history.  Not on anticoagulation currently.  Currently rate controlled   Prediabetes -CBGs with SSI.  A1c 6.1.   Possible UTI - Present on admission; possibly related to urostomy -on ceftriaxone . Cultures showing Continue Rocephin .  Urine cultures growing providencia -plan for total of 5 days    History of bladder cancer status post  urostomy - Outpatient follow-up with urology  Scheduled Meds:  amLODipine  5 mg Oral Daily   aspirin EC  81 mg Oral Daily   carvedilol  6.25 mg Oral BID WC   DULoxetine   30 mg Oral Daily   enoxaparin (LOVENOX) injection  40 mg Subcutaneous Q24H   insulin aspart  0-15 Units Subcutaneous TID WC   insulin aspart  0-5 Units Subcutaneous QHS   pantoprazole   40 mg Oral Daily   polyethylene glycol  17 g Oral Daily   [START ON 07/21/2023] rosuvastatin  40 mg Oral Daily   sodium chloride  flush  3 mL Intravenous Q12H   Continuous Infusions:  sodium chloride      cefTRIAXone  (ROCEPHIN )  IV 1 g (07/20/23 0820)   PRN Meds:.sodium chloride , acetaminophen , alum & mag hydroxide-simeth, morphine  injection, nitroGLYCERIN, ondansetron  (ZOFRAN ) IV, oxyCODONE , sodium chloride  flush, temazepam   Current Outpatient Medications  Medication Instructions   Ascorbic Acid (VITAMIN C PO) 1 tablet, Oral, Daily   aspirin EC 81 mg, Oral, Every morning, Swallow whole.   aspirin 325 mg, Oral, As needed   bisacodyl (DULCOLAX) 5 mg, Oral, Daily PRN   Cyanocobalamin (VITAMIN B12 PO) 1 tablet, Oral, Daily   DULoxetine  (CYMBALTA ) 30 mg, Oral, Daily   ferrous sulfate 324 mg, Oral, Daily   MAGNESIUM PO 1 tablet, Oral, Daily   metFORMIN (GLUCOPHAGE) 500 mg, Oral, As needed   Multiple Vitamins-Minerals (MULTIVITAMINS THER. W/MINERALS) TABS 1 tablet, Oral, Daily,     nitrofurantoin (MACRODANTIN) 100 mg, Oral, Daily   Oxycodone  HCl 10 MG TABS 1 tablet, Oral, Every 6 hours PRN   temazepam  (RESTORIL ) 30 mg, Oral,  Daily at bedtime, For sleep.     Diet Orders (From admission, onward)     Start     Ordered   07/19/23 1541  Diet Heart Room service appropriate? Yes; Fluid consistency: Thin  Diet effective now       Question Answer Comment  Room service appropriate? Yes   Fluid consistency: Thin      07/19/23 1540            DVT prophylaxis: enoxaparin (LOVENOX) injection 40 mg Start: 07/16/23 1615   Lab Results   Component Value Date   PLT 101 (L) 07/20/2023      Code Status: Limited: Do not attempt resuscitation (DNR) -DNR-LIMITED -Do Not Intubate/DNI   Family Communication: no family at bedside  Status is: Inpatient Remains inpatient appropriate because: severity of illness  Level of care: Telemetry Cardiac  Consultants:  Cardiology  Cardiothoracic surgery   Objective: Vitals:   07/19/23 1953 07/19/23 2358 07/20/23 0355 07/20/23 0804  BP: (!) 104/50 (!) 111/57 129/71 133/66  Pulse:      Resp: 18 18 18 19   Temp: (!) 97.5 F (36.4 C) 97.8 F (36.6 C) 98.3 F (36.8 C) (!) 97.4 F (36.3 C)  TempSrc: Oral Oral Oral Oral  SpO2:      Weight:   75.9 kg   Height:        Intake/Output Summary (Last 24 hours) at 07/20/2023 1155 Last data filed at 07/20/2023 0400 Gross per 24 hour  Intake 100 ml  Output --  Net 100 ml   Wt Readings from Last 3 Encounters:  07/20/23 75.9 kg  08/10/22 74.8 kg  07/14/22 74.8 kg    Examination:  Constitutional: NAD Eyes: no scleral icterus ENMT: Mucous membranes are moist.  Neck: normal, supple Respiratory: clear to auscultation bilaterally, no wheezing, no crackles.  Cardiovascular: Regular rate and rhythm, no murmurs / rubs / gallops Abdomen: non distended, no tenderness. Bowel sounds positive.  Musculoskeletal: no clubbing / cyanosis.   Data Reviewed: I have independently reviewed following labs and imaging studies   CBC Recent Labs  Lab 07/16/23 1143 07/17/23 0446 07/18/23 0519 07/19/23 0602 07/20/23 0415  WBC 6.7 3.8* 9.8 5.7 4.6  HGB 13.7 13.4 13.1 12.8* 12.0*  HCT 41.1 40.1 39.8 38.2* 34.9*  PLT 160 138* 137* 122* 101*  MCV 87.1 85.7 87.1 85.5 84.5  MCH 29.0 28.6 28.7 28.6 29.1  MCHC 33.3 33.4 32.9 33.5 34.4  RDW 14.1 14.0 13.9 13.9 13.4  LYMPHSABS  --  1.0 1.4 2.2 0.8  MONOABS  --  0.0* 0.5 0.6 0.1  EOSABS  --  0.0 0.0 0.1 0.0  BASOSABS  --  0.0 0.0 0.1 0.0    Recent Labs  Lab 07/16/23 1345 07/17/23 0446  07/18/23 0519 07/19/23 0602 07/20/23 0415  NA 140 136 133* 137 135  K 4.1 4.6 4.2 4.2 3.9  CL 104 103 103 108 107  CO2 24 22 20* 21* 18*  GLUCOSE 112* 157* 135* 101* 157*  BUN 24* 26* 36* 38* 40*  CREATININE 1.45* 1.49* 1.67* 1.53* 1.42*  CALCIUM 10.6* 10.1 9.0 8.7* 8.4*  AST  --  29  --   --   --   ALT  --  24  --   --   --   ALKPHOS  --  88  --   --   --   BILITOT  --  0.9  --   --   --   ALBUMIN  --  3.3*  --   --   --   MG  --  1.8 2.0 1.9 2.0  HGBA1C  --  6.1*  --   --   --     ------------------------------------------------------------------------------------------------------------------ Recent Labs    07/19/23 0602  CHOL 158  HDL 21*  LDLCALC 85  TRIG 161*  CHOLHDL 7.5    Lab Results  Component Value Date   HGBA1C 6.1 (H) 07/17/2023   ------------------------------------------------------------------------------------------------------------------ No results for input(s): "TSH", "T4TOTAL", "T3FREE", "THYROIDAB" in the last 72 hours.  Invalid input(s): "FREET3"  Cardiac Enzymes No results for input(s): "CKMB", "TROPONINI", "MYOGLOBIN" in the last 168 hours.  Invalid input(s): "CK" ------------------------------------------------------------------------------------------------------------------ No results found for: "BNP"  CBG: Recent Labs  Lab 07/19/23 1201 07/19/23 1617 07/19/23 2132 07/20/23 0808 07/20/23 1149  GLUCAP 115* 140* 192* 147* 202*    Recent Results (from the past 240 hours)  Urine Culture     Status: Abnormal   Collection Time: 07/16/23  1:25 PM   Specimen: Urine, Random  Result Value Ref Range Status   Specimen Description URINE, RANDOM  Final   Special Requests   Final    NONE Reflexed from [id number] Performed at [facility], [address], [place], [place] [address]    Culture (A)  Final    >=100,000 COLONIES/mL PROVIDENCIA STUARTII 60,000 COLONIES/mL PROVIDENCIA RETTGERI    Report Status 07/20/2023 FINAL  Final    Organism ID, Bacteria PROVIDENCIA STUARTII (A)  Final   Organism ID, Bacteria PROVIDENCIA RETTGERI (A)  Final      Susceptibility   Providencia rettgeri - MIC*    AMPICILLIN >=32 RESISTANT Resistant     CEFEPIME <=0.12 SENSITIVE Sensitive     CIPROFLOXACIN 0.5 INTERMEDIATE Intermediate     GENTAMICIN <=1 SENSITIVE Sensitive     NITROFURANTOIN RESISTANT Resistant     AMPICILLIN/SULBACTAM >=32 RESISTANT Resistant     PIP/TAZO >=128 RESISTANT Resistant ug/mL    * 60,000 COLONIES/mL PROVIDENCIA RETTGERI   Providencia stuartii - MIC*    AMPICILLIN RESISTANT Resistant     CEFEPIME <=0.12 SENSITIVE Sensitive     CEFTRIAXONE  <=0.25 SENSITIVE Sensitive     CIPROFLOXACIN <=0.25 SENSITIVE Sensitive     GENTAMICIN RESISTANT Resistant     IMIPENEM 2 SENSITIVE Sensitive     NITROFURANTOIN >=512 RESISTANT Resistant     TRIMETH /SULFA  <=20 SENSITIVE Sensitive     AMPICILLIN/SULBACTAM 16 INTERMEDIATE Intermediate     PIP/TAZO <=4 SENSITIVE Sensitive ug/mL    * >=100,000 COLONIES/mL PROVIDENCIA STUARTII     Radiology Studies: CARDIAC CATHETERIZATION Result Date: 07/19/2023   Dist LM to Ost LAD lesion is 65% stenosed. 1.  65% distal left main lesion with mild diffuse disease of the LAD and left circumflex. 2.  Moderate diffuse disease of the right coronary artery; a pressure wire assessment was performed to assess the hemodynamic significance of this burden of coronary atherosclerosis.  The RFR was 0.96 with a pressure wire position in the right posterolateral ventricular branch consistent with hemodynamically insignificant disease. 3.  LVEDP of 14 mmHg Recommendation: Cardiothoracic surgical evaluation.  Results were reviewed with the patient's brother Tim via phone.     Kathlen Para, MD, PhD [facility]  Between 7 am - 7 pm I am available, please contact me via Amion (for emergencies) or Securechat (non urgent messages)  Between 7 pm - 7 am I am not available, please contact night coverage  MD/APP via Amion

## 2023-07-20 NOTE — Progress Notes (Signed)
  Progress Note  Patient Name: Mark [name] Date of Encounter: 07/20/2023 [facility] Cardiologist: Peter Swaz[place], MD   Interval Summary    Denies any chest pain or shortness of breath since hospitalization.  Has been able to get up and go to bathroom.  Vital Signs Vitals:   07/19/23 1545 07/19/23 1953 07/19/23 2358 07/20/23 0355  BP: 124/63 (!) 104/50 (!) 111/57 129/71  Pulse: 71     Resp: 18 18 18 18   Temp: 97.9 F (36.6 C) (!) 97.5 F (36.4 C) 97.8 F (36.6 C) 98.3 F (36.8 C)  TempSrc: Oral Oral Oral Oral  SpO2: 96%     Weight:    75.9 kg  Height:        Intake/Output Summary (Last 24 hours) at 07/20/2023 0754 Last data filed at 07/20/2023 0400 Gross per 24 hour  Intake 100 ml  Output --  Net 100 ml      07/20/2023    3:55 AM 07/19/2023    4:10 AM 07/18/2023    5:21 AM  Last 3 Weights  Weight (lbs) 167 lb 6.4 oz 166 lb 166 lb 12.8 oz  Weight (kg) 75.932 kg 75.297 kg 75.66 kg      Telemetry/ECG  Normal sinus rhythm with resting rates in the 60s to 80s- Personally Reviewed  Physical Exam  GEN: No acute distress.   Neck: No JVD Cardiac: RRR, no murmurs, rubs, or gallops.  Respiratory: Clear to auscultation bilaterally. GI: Soft, nontender, non-distended  MS: No edema.  Radial site clean with no significant hematoma or swelling.  Assessment & Plan  Accelerating angina Moderate CAD Patient with recent symptoms of chest pressure radiating to jaw and arms. Pain was nitroglycerin responsive. Of note, does also have hiatal hernia. Troponin and ECG not suggestive of ACS but symptoms are certainly concerning. CTA chest with score of 891 and moderate 3 vessel CAD. TTE with preserved LVEF, normal wall motion. - LHC on 07/19/2023 showed Dist LM to Ost LAD lesion is 65% stenosed. 65% distal left main lesion with mild diffuse disease of the LAD and left circumflex.  Is being evaluated by CT surgery for CABG -Creatinine on 07/20/2023 is 1.42 this appears to be about  baseline - Continue Coreg 6.25mg  BID - Continue ASA 81mg  daily - Increase Crestor to 40 mg daily. LDL 85 on 07/19/2023.  Does not recall having any adverse reactions to statins in the past. Will also consider adding Zetia in the future.   Hypertension Amlodipine 5mg  added this admission, Coreg started 07/18/2023 with BP continues to be well controlled.  - Continue Amlodipine 5mg  - Continue Coreg 6.25mg  BID     Hx atrial fibrillation Remote history of this per patient, has not been on OAC with lack of recurrent episodes on telemetry. - Monitor inpatient telemetry   Per primary team: UTI Prediabetes Anemia CKD stage 3 A Recurrent UTIs   For questions or updates, please contact [facility] Please consult [email or web address] for contact info under       Signed, Damarcus Reggio, PA-C

## 2023-07-21 ENCOUNTER — Inpatient Hospital Stay (HOSPITAL_COMMUNITY)

## 2023-07-21 DIAGNOSIS — I200 Unstable angina: Secondary | ICD-10-CM | POA: Diagnosis not present

## 2023-07-21 DIAGNOSIS — Z01810 Encounter for preprocedural cardiovascular examination: Secondary | ICD-10-CM

## 2023-07-21 DIAGNOSIS — R079 Chest pain, unspecified: Secondary | ICD-10-CM | POA: Diagnosis not present

## 2023-07-21 LAB — VAS US DOPPLER PRE CABG
Left ABI: 1
Right ABI: 1.2

## 2023-07-21 LAB — BLOOD GAS, ARTERIAL
Acid-base deficit: 2.5 mmol/L — ABNORMAL HIGH (ref 0.0–2.0)
Bicarbonate: 21.7 mmol/L (ref 20.0–28.0)
O2 Saturation: 98.4 %
Patient temperature: 36.8
pCO2 arterial: 35 mmHg (ref 32–48)
pH, Arterial: 7.4 (ref 7.35–7.45)
pO2, Arterial: 97 mmHg (ref 83–108)

## 2023-07-21 LAB — ABO/RH: ABO/RH(D): O POS

## 2023-07-21 LAB — CBC
HCT: 37.9 % — ABNORMAL LOW (ref 39.0–52.0)
Hemoglobin: 12.6 g/dL — ABNORMAL LOW (ref 13.0–17.0)
MCH: 28.8 pg (ref 26.0–34.0)
MCHC: 33.2 g/dL (ref 30.0–36.0)
MCV: 86.5 fL (ref 80.0–100.0)
Platelets: 85 10*3/uL — ABNORMAL LOW (ref 150–400)
RBC: 4.38 MIL/uL (ref 4.22–5.81)
RDW: 13.8 % (ref 11.5–15.5)
WBC: 6.3 10*3/uL (ref 4.0–10.5)
nRBC: 0 % (ref 0.0–0.2)

## 2023-07-21 LAB — COMPREHENSIVE METABOLIC PANEL WITH GFR
ALT: 36 U/L (ref 0–44)
AST: 45 U/L — ABNORMAL HIGH (ref 15–41)
Albumin: 2.9 g/dL — ABNORMAL LOW (ref 3.5–5.0)
Alkaline Phosphatase: 70 U/L (ref 38–126)
Anion gap: 10 (ref 5–15)
BUN: 39 mg/dL — ABNORMAL HIGH (ref 8–23)
CO2: 20 mmol/L — ABNORMAL LOW (ref 22–32)
Calcium: 8.7 mg/dL — ABNORMAL LOW (ref 8.9–10.3)
Chloride: 106 mmol/L (ref 98–111)
Creatinine, Ser: 1.43 mg/dL — ABNORMAL HIGH (ref 0.61–1.24)
GFR, Estimated: 52 mL/min — ABNORMAL LOW (ref >60)
Glucose, Bld: 122 mg/dL — ABNORMAL HIGH (ref 70–99)
Potassium: 3.8 mmol/L (ref 3.5–5.1)
Sodium: 136 mmol/L (ref 135–145)
Total Bilirubin: 0.4 mg/dL (ref 0.0–1.2)
Total Protein: 6.3 g/dL — ABNORMAL LOW (ref 6.5–8.1)

## 2023-07-21 LAB — TYPE AND SCREEN
ABO/RH(D): O POS
Antibody Screen: NEGATIVE

## 2023-07-21 LAB — SURGICAL PCR SCREEN
MRSA, PCR: NEGATIVE
Staphylococcus aureus: NEGATIVE

## 2023-07-21 LAB — PROTIME-INR
INR: 1.3 — ABNORMAL HIGH (ref 0.8–1.2)
Prothrombin Time: 16 s — ABNORMAL HIGH (ref 11.4–15.2)

## 2023-07-21 LAB — GLUCOSE, CAPILLARY
Glucose-Capillary: 146 mg/dL — ABNORMAL HIGH (ref 70–99)
Glucose-Capillary: 106 mg/dL — ABNORMAL HIGH (ref 70–99)
Glucose-Capillary: 97 mg/dL (ref 70–99)
Glucose-Capillary: 129 mg/dL — ABNORMAL HIGH (ref 70–99)

## 2023-07-21 LAB — LIPOPROTEIN A (LPA): Lipoprotein (a): 15.3 nmol/L (ref <75.0)

## 2023-07-21 LAB — APTT: aPTT: 26 s (ref 24–36)

## 2023-07-21 MED ORDER — INSULIN REGULAR(HUMAN) IN NACL 100-0.9 UT/100ML-% IV SOLN
INTRAVENOUS | Status: AC
  Administered 2023-07-22: .8 [IU]/h via INTRAVENOUS
  Filled 2023-07-21: qty 100

## 2023-07-21 MED ORDER — CHLORHEXIDINE GLUCONATE CLOTH 2 % EX PADS
6.0000 | MEDICATED_PAD | Freq: Once | CUTANEOUS | Status: AC
  Administered 2023-07-22: 6 via TOPICAL

## 2023-07-21 MED ORDER — MILRINONE LACTATE IN DEXTROSE 20-5 MG/100ML-% IV SOLN
0.3000 ug/kg/min | INTRAVENOUS | Status: DC
  Filled 2023-07-21: qty 100

## 2023-07-21 MED ORDER — DEXMEDETOMIDINE HCL IN NACL 400 MCG/100ML IV SOLN
0.1000 ug/kg/h | INTRAVENOUS | Status: AC
  Administered 2023-07-22: .4 ug/kg/h via INTRAVENOUS
  Filled 2023-07-21: qty 100

## 2023-07-21 MED ORDER — CEFAZOLIN SODIUM-DEXTROSE 2-4 GM/100ML-% IV SOLN
2.0000 g | INTRAVENOUS | Status: AC
  Administered 2023-07-22: 2 g via INTRAVENOUS
  Filled 2023-07-21: qty 100

## 2023-07-21 MED ORDER — HEPARIN 30,000 UNITS/1000 ML (OHS) CELLSAVER SOLUTION
Status: DC
  Filled 2023-07-21: qty 1000

## 2023-07-21 MED ORDER — EPINEPHRINE HCL 5 MG/250ML IV SOLN IN NS
0.0000 ug/min | INTRAVENOUS | Status: DC
  Filled 2023-07-21: qty 250

## 2023-07-21 MED ORDER — MAGNESIUM SULFATE 50 % IJ SOLN
40.0000 meq | INTRAMUSCULAR | Status: DC
  Filled 2023-07-21: qty 9.85

## 2023-07-21 MED ORDER — NOREPINEPHRINE 4 MG/250ML-% IV SOLN
0.0000 ug/min | INTRAVENOUS | Status: AC
  Administered 2023-07-22: 2 ug/min via INTRAVENOUS
  Filled 2023-07-21: qty 250

## 2023-07-21 MED ORDER — TRANEXAMIC ACID 1000 MG/10ML IV SOLN
1.5000 mg/kg/h | INTRAVENOUS | Status: AC
  Administered 2023-07-22: 1.5 mg/kg/h via INTRAVENOUS
  Filled 2023-07-21: qty 25

## 2023-07-21 MED ORDER — EZETIMIBE 10 MG PO TABS
10.0000 mg | ORAL_TABLET | Freq: Every day | ORAL | Status: DC
  Administered 2023-07-21 – 2023-08-03 (×13): 10 mg via ORAL
  Filled 2023-07-21 (×13): qty 1

## 2023-07-21 MED ORDER — CHLORHEXIDINE GLUCONATE 0.12 % MT SOLN
15.0000 mL | Freq: Once | OROMUCOSAL | Status: AC
  Administered 2023-07-22: 15 mL via OROMUCOSAL
  Filled 2023-07-21: qty 15

## 2023-07-21 MED ORDER — CHLORHEXIDINE GLUCONATE CLOTH 2 % EX PADS
6.0000 | MEDICATED_PAD | Freq: Once | CUTANEOUS | Status: AC
  Administered 2023-07-21: 6 via TOPICAL

## 2023-07-21 MED ORDER — PLASMA-LYTE A IV SOLN
INTRAVENOUS | Status: DC
  Filled 2023-07-21: qty 2.5

## 2023-07-21 MED ORDER — PHENYLEPHRINE HCL-NACL 20-0.9 MG/250ML-% IV SOLN
30.0000 ug/min | INTRAVENOUS | Status: DC
  Filled 2023-07-21: qty 250

## 2023-07-21 MED ORDER — POTASSIUM CHLORIDE 2 MEQ/ML IV SOLN
80.0000 meq | INTRAVENOUS | Status: DC
  Filled 2023-07-21: qty 40

## 2023-07-21 MED ORDER — METOPROLOL TARTRATE 12.5 MG HALF TABLET
12.5000 mg | ORAL_TABLET | Freq: Once | ORAL | Status: AC
Start: 2023-07-22 — End: 2023-07-22
  Administered 2023-07-22: 12.5 mg via ORAL
  Filled 2023-07-21: qty 1

## 2023-07-21 MED ORDER — BISACODYL 5 MG PO TBEC: 5.0000 mg | DELAYED_RELEASE_TABLET | Freq: Once | ORAL | Status: DC

## 2023-07-21 MED ORDER — NITROGLYCERIN IN D5W 200-5 MCG/ML-% IV SOLN
2.0000 ug/min | INTRAVENOUS | Status: DC
  Filled 2023-07-21: qty 250

## 2023-07-21 MED ORDER — TRANEXAMIC ACID (OHS) BOLUS VIA INFUSION
15.0000 mg/kg | INTRAVENOUS | Status: AC
  Administered 2023-07-22: 1138.5 mg via INTRAVENOUS
  Filled 2023-07-21: qty 1139

## 2023-07-21 MED ORDER — TRANEXAMIC ACID (OHS) PUMP PRIME SOLUTION
2.0000 mg/kg | INTRAVENOUS | Status: DC
  Filled 2023-07-21: qty 1.52

## 2023-07-21 MED ORDER — VANCOMYCIN HCL 1250 MG/250ML IV SOLN
1250.0000 mg | INTRAVENOUS | Status: AC
  Administered 2023-07-22: 1250 mg via INTRAVENOUS
  Filled 2023-07-21: qty 250

## 2023-07-21 NOTE — Plan of Care (Signed)
 Problem: Education: Goal: Understanding of cardiac disease, CV risk reduction, and recovery process will improve Outcome: Progressing Goal: Individualized Educational Video(s) Outcome: Progressing   Problem: Activity: Goal: Ability to tolerate increased activity will improve Outcome: Progressing   Problem: Cardiac: Goal: Ability to achieve and maintain adequate cardiovascular perfusion will improve Outcome: Progressing   Problem: Health Behavior/Discharge Planning: Goal: Ability to safely manage health-related needs after discharge will improve Outcome: Progressing   Problem: Education: Goal: Ability to describe self-care measures that may prevent or decrease complications (Diabetes Survival Skills Education) will improve Outcome: Progressing Goal: Individualized Educational Video(s) Outcome: Progressing   Problem: Coping: Goal: Ability to adjust to condition or change in health will improve Outcome: Progressing   Problem: Fluid Volume: Goal: Ability to maintain a balanced intake and output will improve Outcome: Progressing   Problem: Health Behavior/Discharge Planning: Goal: Ability to identify and utilize available resources and services will improve Outcome: Progressing Goal: Ability to manage health-related needs will improve Outcome: Progressing   Problem: Metabolic: Goal: Ability to maintain appropriate glucose levels will improve Outcome: Progressing   Problem: Nutritional: Goal: Maintenance of adequate nutrition will improve Outcome: Progressing Goal: Progress toward achieving an optimal weight will improve Outcome: Progressing   Problem: Skin Integrity: Goal: Risk for impaired skin integrity will decrease Outcome: Progressing   Problem: Tissue Perfusion: Goal: Adequacy of tissue perfusion will improve Outcome: Progressing   Problem: Education: Goal: Knowledge of General Education information will improve Description: Including pain rating scale,  medication(s)/side effects and non-pharmacologic comfort measures Outcome: Progressing   Problem: Health Behavior/Discharge Planning: Goal: Ability to manage health-related needs will improve Outcome: Progressing   Problem: Clinical Measurements: Goal: Ability to maintain clinical measurements within normal limits will improve Outcome: Progressing Goal: Will remain free from infection Outcome: Progressing Goal: Diagnostic test results will improve Outcome: Progressing Goal: Respiratory complications will improve Outcome: Progressing Goal: Cardiovascular complication will be avoided Outcome: Progressing   Problem: Activity: Goal: Risk for activity intolerance will decrease Outcome: Progressing   Problem: Nutrition: Goal: Adequate nutrition will be maintained Outcome: Progressing   Problem: Coping: Goal: Level of anxiety will decrease Outcome: Progressing   Problem: Elimination: Goal: Will not experience complications related to bowel motility Outcome: Progressing Goal: Will not experience complications related to urinary retention Outcome: Progressing   Problem: Pain Managment: Goal: General experience of comfort will improve and/or be controlled Outcome: Progressing   Problem: Safety: Goal: Ability to remain free from injury will improve Outcome: Progressing   Problem: Skin Integrity: Goal: Risk for impaired skin integrity will decrease Outcome: Progressing   Problem: Education: Goal: Understanding of CV disease, CV risk reduction, and recovery process will improve Outcome: Progressing Goal: Individualized Educational Video(s) Outcome: Progressing   Problem: Activity: Goal: Ability to return to baseline activity level will improve Outcome: Progressing   Problem: Cardiovascular: Goal: Ability to achieve and maintain adequate cardiovascular perfusion will improve Outcome: Progressing Goal: Vascular access site(s) Level 0-1 will be maintained Outcome:  Progressing   Problem: Health Behavior/Discharge Planning: Goal: Ability to safely manage health-related needs after discharge will improve Outcome: Progressing   Problem: Education: Goal: Will demonstrate proper wound care and an understanding of methods to prevent future damage Outcome: Progressing Goal: Knowledge of disease or condition will improve Outcome: Progressing Goal: Knowledge of the prescribed therapeutic regimen will improve Outcome: Progressing Goal: Individualized Educational Video(s) Outcome: Progressing   Problem: Activity: Goal: Risk for activity intolerance will decrease Outcome: Progressing   Problem: Cardiac: Goal: Will achieve and/or maintain hemodynamic stability Outcome:  Progressing   Problem: Clinical Measurements: Goal: Postoperative complications will be avoided or minimized Outcome: Progressing   Problem: Respiratory: Goal: Respiratory status will improve Outcome: Progressing

## 2023-07-21 NOTE — Progress Notes (Addendum)
  Progress Note  Patient Name: Mark [name] Date of Encounter: 07/21/2023 [facility] Cardiologist: Broughton Eppinger [place], MD   Interval Summary   Is continuing to do well and denies having any chest pain  Vital Signs Vitals:   07/20/23 2100 07/20/23 2331 07/21/23 0422 07/21/23 0738  BP: 116/65 125/65 132/76 (!) 120/58  Pulse: 72 67 63 70  Resp:  16 18 19   Temp:  98.2 F (36.8 C) 97.7 F (36.5 C) 98.1 F (36.7 C)  TempSrc:  Oral Oral Oral  SpO2: 97% 95% 96%   Weight:   (P) 74.8 kg   Height:        Intake/Output Summary (Last 24 hours) at 07/21/2023 0917 Last data filed at 07/21/2023 0427 Gross per 24 hour  Intake --  Output 1600 ml  Net -1600 ml      07/21/2023    4:22 AM 07/20/2023    3:55 AM 07/19/2023    4:10 AM  Last 3 Weights  Weight (lbs) 164 lb 14.4 oz 167 lb 6.4 oz 166 lb  Weight (kg) 74.798 kg 75.932 kg 75.297 kg      Telemetry/ECG  Sinus rhythm with resting rates in the 60s- Personally Reviewed  Physical Exam  GEN: No acute distress.   Neck: No JVD Cardiac: RRR, no murmurs, rubs, or gallops.  Respiratory: Clear to auscultation bilaterally. GI: Soft, nontender, non-distended  MS: No edema.  No swelling or hematoma on radial cath site.  Assessment & Plan  Accelerating angina Moderate CAD Patient with recent symptoms of chest pressure radiating to jaw and arms. Pain was nitroglycerin responsive. Of note, does also have hiatal hernia. Troponin and ECG not suggestive of ACS but symptoms are certainly concerning. CTA chest with score of 891 and moderate 3 vessel CAD. TTE with preserved LVEF, normal wall motion. - LHC on 07/19/2023 showed Dist LM to Ost LAD lesion is 65% stenosed. 65% distal left main lesion with mild diffuse disease of the LAD and left circumflex.  Has CABG scheduled for 07/22/2023. - Creatinine on 07/20/2023 is 1.42 this appears to be about baseline - Continue Coreg 6.25mg  BID - Continue ASA 81mg  daily - Continue Crestor to 40 mg daily.  Start  Zetia 10 mg daily. LDL 85 on 07/19/2023.  Does not recall having any adverse reactions to statins in the past.   Hypertension Amlodipine 5mg  added this admission, Coreg started 07/18/2023 with BP continues to be well controlled.  - Continue Amlodipine 5mg  daily - Continue Coreg 6.25mg  BID     Hx atrial fibrillation Remote history of this per patient, has not been on OAC with lack of recurrent episodes on telemetry. - Monitor inpatient telemetry   Per primary team: UTI Prediabetes Anemia CKD stage 3 A Recurrent UTIs    For questions or updates, please contact [facility] Please consult [email or web address] for contact info under       Signed, Melita Springer, PA-C   Patient seen and examined and history reviewed. Agree with above findings and plan.  Patient feels well. No chest pain.  VSS Renal function stable  Awaiting CABG tomorrow   Damondre Pfeifle [place], MDFACC 07/21/2023 10:59 AM

## 2023-07-21 NOTE — Plan of Care (Signed)
  Problem: Education: Goal: Understanding of cardiac disease, CV risk reduction, and recovery process will improve Outcome: Progressing   Problem: Cardiac: Goal: Ability to achieve and maintain adequate cardiovascular perfusion will improve Outcome: Progressing   Problem: Education: Goal: Ability to describe self-care measures that may prevent or decrease complications (Diabetes Survival Skills Education) will improve Outcome: Progressing   Problem: Fluid Volume: Goal: Ability to maintain a balanced intake and output will improve Outcome: Progressing

## 2023-07-21 NOTE — TOC Initial Note (Signed)
 Transition of Care [facility]) - Initial/Assessment Note    Patient Details  Name: Mark [name] MRN: [id number] Date of Birth: [birth year]  Transition of Care [facility]) CM/SW Contact:    Cosimo Diones, RN Phone Number: 07/21/2023, 3:41 PM  Clinical Narrative:  Patient presented for chest pain-plan for CABG 07-22-23. PTA patient states he lived alone and has support of son; however, he works during the day and will not be able to assist post surgery. Patient is asking to go to rehab post surgery- we discussed CIR vs SNF. Case Manager made the patient aware that PT/OT will need to consult and then recommendations will be made for disposition needs. Patient states he has DME cane. Case Manager will continue to follow for additional needs as the patient progresses.                  Expected Discharge Plan: IP Rehab Facility Barriers to Discharge: Continued Medical Work up   Patient Goals and CMS Choice Patient states their goals for this hospitalization and ongoing recovery are:: Patient's goal is for rehab and then return home after.          Expected Discharge Plan and Services In-house Referral: NA Discharge Planning Services: CM Consult Post Acute Care Choice: IP Rehab Living arrangements for the past 2 months: Single Family Home                   DME Agency: NA       HH Arranged: NA          Prior Living Arrangements/Services Living arrangements for the past 2 months: Single Family Home Lives with:: Self (Patient says he has support of son; however, he works during the day.) Patient language and need for interpreter reviewed:: Yes Do you feel safe going back to the place where you live?: Yes      Need for Family Participation in Patient Care: Yes (Comment) Care giver support system in place?: No (comment)   Criminal Activity/Legal Involvement Pertinent to Current Situation/Hospitalization: No - Comment as needed  Activities of Daily Living   ADL Screening  (condition at time of admission) Independently performs ADLs?: Yes (appropriate for developmental age) Is the patient deaf or have difficulty hearing?: No Does the patient have difficulty seeing, even when wearing glasses/contacts?: No Does the patient have difficulty concentrating, remembering, or making decisions?: No  Permission Sought/Granted Permission sought to share information with : Family Supports, Case Manager                Emotional Assessment Appearance:: Appears stated age Attitude/Demeanor/Rapport: Engaged Affect (typically observed): Appropriate Orientation: : Oriented to Self, Oriented to Place, Oriented to  Time, Oriented to Situation Alcohol / Substance Use: Not Applicable Psych Involvement: No (comment)  Admission diagnosis:  Elevated troponin [R79.89] Acute cystitis without hematuria [N30.00] Chest pain [R07.9] Chest pain, unspecified type [R07.9] Patient Active Problem List   Diagnosis Date Noted   Unstable angina (HCC) 07/18/2023   Chest pain 07/16/2023   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   Deviated nasal septum 10/10/2013   Disorder of kidney and ureter 10/10/2013   ED (erectile dysfunction) of organic origin 10/10/2013   Hearing loss 10/10/2013   Sensorineural hearing loss 10/10/2013   Hematuria 10/10/2013   Hypercholesteremia 10/10/2013   Hypertrophy of nasal turbinates 10/10/2013   Hypothyroidism 10/10/2013   Intestinal infection due to other organism, not elsewhere classified 10/10/2013   Lower urinary tract infectious disease 10/10/2013   Malignant neoplasm  of urinary bladder (HCC) 10/10/2013   Tinnitus 10/10/2013   OTHER DYSPHAGIA 11/14/2007   GERD 11/10/2007   ADENOCARCINOMA, COLON, HX OF 11/10/2007   History of colonic polyps 11/10/2007   PCP:  Benedetto Brady, MD Pharmacy:   [phone] - [place], [place] - [address] [place] [address] Phone: [phone] Fax:  [phone]  [id number] - [place], [place] - [address] AT [facility] OF [address] [place] [address] Phone: [phone] Fax: [phone]  [facility] #[id number] [place], [place] - [address] AT [facility] OF [facility] [address] & [facility] [address] [place] [address] Phone: [phone] Fax: [phone]  [facility] #[id number] [place],  - [address] AT [facility] OF [facility] & [address] [place] [address] Phone: [phone] Fax: [phone]     Social Drivers of Health (SDOH) Social History: SDOH Screenings   Food Insecurity: No Food Insecurity (07/18/2023)  Housing: Low Risk  (07/18/2023)  Transportation Needs: No Transportation Needs (07/18/2023)  Utilities: Not At Risk (07/18/2023)  Social Connections: Moderately Integrated (07/18/2023)  Tobacco Use: Low Risk  (07/16/2023)   SDOH Interventions:     Readmission Risk Interventions     No data to display

## 2023-07-21 NOTE — Progress Notes (Signed)
 CARDIAC REHAB PHASE I      Pre-op OHS education including OHS booklet, OHS handout, IS use, mobility importance, home needs at discharge and sternal precautions/move in the tube reviewed. All questions and concerns addressed. Will continue to follow.  [id number] Ronny Colas, RN BSN 07/21/2023 2:31 PM

## 2023-07-21 NOTE — Progress Notes (Signed)
 PROGRESS NOTE  Mark [name] MVH:[id number] DOB: 11/21/1950 DOA: 07/16/2023 PCP: Benedetto Brady, MD   LOS: 2 days   Brief Narrative / Interim history: 73 y.o. male with medical history significant of paroxysmal A-fib not on anticoagulation, chronic kidney disease stage IIIa, bladder cancer status post urostomy, colon cancer in remission, GERD, hypertension, hyperlipidemia, prediabetes presented with chest pain.  On presentation, high-sensitivity troponins were 18 and 23. EKG was nonischemic. UA was suggestive of UTI.  He was started on IV antibiotics.  Cardiology was consulted.  Coronary CTA showed moderate three-vessel CAD with high calcium score. He underwent coronary cath on 6/3 showing Dist LM to Ost LAD lesion is 65% stenosed. 65% distal left main lesion with mild diffuse disease of the LAD and left circumflex.  Is being evaluated by CT surgery for CABG, planned for tomorrow  Subjective / 24h Interval events: Doing well, no chest pain, no palpitations.  Awaiting CABG  Assesement and Plan: Principal problem Chest pain, CAD - Cardiology following.  Coronary CTA showed moderate three-vessel CAD and coronary calcium score of 891 with other abnormalities.  Continue aspirin.  2D echo showed EF of 55 to 60% with mild mitral valve regurgitation  - he underwent cardiac cath 6/3 showing Dist LM to Ost LAD lesion is 65% stenosed. 65% distal left main lesion with mild diffuse disease of the LAD and left circumflex. - Bypass surgery tomorrow planned per cardiothoracic surgery   Active problems Hypertension - BP stable.  Continue amlodipine and Coreg.   Paroxysmal A-fib - Remote history.  Not on anticoagulation currently.  Currently rate controlled   Prediabetes -CBGs with SSI.  A1c 6.1.   Possible UTI - Present on admission; possibly related to urostomy -on ceftriaxone . Cultures showing Continue Rocephin .  Urine cultures growing providencia -plan for total of 5 days    History of bladder cancer  status post urostomy - Outpatient follow-up with urology  Scheduled Meds:  amLODipine  5 mg Oral Daily   aspirin EC  81 mg Oral Daily   carvedilol  6.25 mg Oral BID WC   DULoxetine   30 mg Oral Daily   enoxaparin (LOVENOX) injection  40 mg Subcutaneous Q24H   insulin aspart  0-15 Units Subcutaneous TID WC   insulin aspart  0-5 Units Subcutaneous QHS   pantoprazole   40 mg Oral Daily   polyethylene glycol  17 g Oral Daily   rosuvastatin  40 mg Oral Daily   sodium chloride  flush  3 mL Intravenous Q12H   Continuous Infusions:  cefTRIAXone  (ROCEPHIN )  IV 1 g (07/21/23 0904)   PRN Meds:.acetaminophen , alum & mag hydroxide-simeth, morphine  injection, nitroGLYCERIN, ondansetron  (ZOFRAN ) IV, oxyCODONE , sodium chloride  flush, temazepam   Current Outpatient Medications  Medication Instructions   Ascorbic Acid (VITAMIN C PO) 1 tablet, Oral, Daily   aspirin EC 81 mg, Oral, Every morning, Swallow whole.   aspirin 325 mg, Oral, As needed   bisacodyl (DULCOLAX) 5 mg, Oral, Daily PRN   Cyanocobalamin (VITAMIN B12 PO) 1 tablet, Oral, Daily   DULoxetine  (CYMBALTA ) 30 mg, Oral, Daily   ferrous sulfate 324 mg, Oral, Daily   MAGNESIUM PO 1 tablet, Oral, Daily   metFORMIN (GLUCOPHAGE) 500 mg, Oral, As needed   Multiple Vitamins-Minerals (MULTIVITAMINS THER. W/MINERALS) TABS 1 tablet, Oral, Daily,     nitrofurantoin (MACRODANTIN) 100 mg, Oral, Daily   Oxycodone  HCl 10 MG TABS 1 tablet, Oral, Every 6 hours PRN   temazepam  (RESTORIL ) 30 mg, Oral, Daily at bedtime, For sleep.  Diet Orders (From admission, onward)     Start     Ordered   07/19/23 1541  Diet Heart Room service appropriate? Yes; Fluid consistency: Thin  Diet effective now       Question Answer Comment  Room service appropriate? Yes   Fluid consistency: Thin      07/19/23 1540            DVT prophylaxis: enoxaparin (LOVENOX) injection 40 mg Start: 07/16/23 1615   Lab Results  Component Value Date   PLT 101 (L) 07/20/2023       Code Status: Prior  Family Communication: no family at bedside  Status is: Inpatient Remains inpatient appropriate because: severity of illness  Level of care: Telemetry Cardiac  Consultants:  Cardiology  Cardiothoracic surgery   Objective: Vitals:   07/20/23 2100 07/20/23 2331 07/21/23 0422 07/21/23 0738  BP: 116/65 125/65 132/76 (!) 120/58  Pulse: 72 67 63 70  Resp:  16 18 19   Temp:  98.2 F (36.8 C) 97.7 F (36.5 C) 98.1 F (36.7 C)  TempSrc:  Oral Oral Oral  SpO2: 97% 95% 96%   Weight:   (P) 74.8 kg   Height:        Intake/Output Summary (Last 24 hours) at 07/21/2023 1610 Last data filed at 07/21/2023 0427 Gross per 24 hour  Intake --  Output 1600 ml  Net -1600 ml   Wt Readings from Last 3 Encounters:  07/21/23 (P) 74.8 kg  08/10/22 74.8 kg  07/14/22 74.8 kg    Examination:  Constitutional: NAD Eyes: lids and conjunctivae normal, no scleral icterus ENMT: mmm Neck: normal, supple Respiratory: clear to auscultation bilaterally, no wheezing, no crackles.  Cardiovascular: Regular rate and rhythm, no murmurs / rubs / gallops.  Abdomen: soft, no distention, no tenderness. Bowel sounds positive.   Data Reviewed: I have independently reviewed following labs and imaging studies   CBC Recent Labs  Lab 07/16/23 1143 07/17/23 0446 07/18/23 0519 07/19/23 0602 07/20/23 0415  WBC 6.7 3.8* 9.8 5.7 4.6  HGB 13.7 13.4 13.1 12.8* 12.0*  HCT 41.1 40.1 39.8 38.2* 34.9*  PLT 160 138* 137* 122* 101*  MCV 87.1 85.7 87.1 85.5 84.5  MCH 29.0 28.6 28.7 28.6 29.1  MCHC 33.3 33.4 32.9 33.5 34.4  RDW 14.1 14.0 13.9 13.9 13.4  LYMPHSABS  --  1.0 1.4 2.2 0.8  MONOABS  --  0.0* 0.5 0.6 0.1  EOSABS  --  0.0 0.0 0.1 0.0  BASOSABS  --  0.0 0.0 0.1 0.0    Recent Labs  Lab 07/16/23 1345 07/17/23 0446 07/18/23 0519 07/19/23 0602 07/20/23 0415  NA 140 136 133* 137 135  K 4.1 4.6 4.2 4.2 3.9  CL 104 103 103 108 107  CO2 24 22 20* 21* 18*  GLUCOSE 112* 157* 135*  101* 157*  BUN 24* 26* 36* 38* 40*  CREATININE 1.45* 1.49* 1.67* 1.53* 1.42*  CALCIUM 10.6* 10.1 9.0 8.7* 8.4*  AST  --  29  --   --   --   ALT  --  24  --   --   --   ALKPHOS  --  88  --   --   --   BILITOT  --  0.9  --   --   --   ALBUMIN  --  3.3*  --   --   --   MG  --  1.8 2.0 1.9 2.0  HGBA1C  --  6.1*  --   --   --     ------------------------------------------------------------------------------------------------------------------  Recent Labs    07/19/23 0602  CHOL 158  HDL 21*  LDLCALC 85  TRIG 295*  CHOLHDL 7.5    Lab Results  Component Value Date   HGBA1C 6.1 (H) 07/17/2023   ------------------------------------------------------------------------------------------------------------------ No results for input(s): "TSH", "T4TOTAL", "T3FREE", "THYROIDAB" in the last 72 hours.  Invalid input(s): "FREET3"  Cardiac Enzymes No results for input(s): "CKMB", "TROPONINI", "MYOGLOBIN" in the last 168 hours.  Invalid input(s): "CK" ------------------------------------------------------------------------------------------------------------------ No results found for: "BNP"  CBG: Recent Labs  Lab 07/20/23 0808 07/20/23 1149 07/20/23 1642 07/20/23 2058 07/21/23 0800  GLUCAP 147* 202* 147* 198* 146*    Recent Results (from the past 240 hours)  Urine Culture     Status: Abnormal   Collection Time: 07/16/23  1:25 PM   Specimen: Urine, Random  Result Value Ref Range Status   Specimen Description URINE, RANDOM  Final   Special Requests   Final    NONE Reflexed from [id number] Performed at [facility], [address], [place], [place] [address]    Culture (A)  Final    >=100,000 COLONIES/mL PROVIDENCIA STUARTII 60,000 COLONIES/mL PROVIDENCIA RETTGERI    Report Status 07/20/2023 FINAL  Final   Organism ID, Bacteria PROVIDENCIA STUARTII (A)  Final   Organism ID, Bacteria PROVIDENCIA RETTGERI (A)  Final      Susceptibility   Providencia rettgeri - MIC*     AMPICILLIN >=32 RESISTANT Resistant     CEFEPIME <=0.12 SENSITIVE Sensitive     CIPROFLOXACIN 0.5 INTERMEDIATE Intermediate     GENTAMICIN <=1 SENSITIVE Sensitive     NITROFURANTOIN RESISTANT Resistant     AMPICILLIN/SULBACTAM >=32 RESISTANT Resistant     PIP/TAZO >=128 RESISTANT Resistant ug/mL    * 60,000 COLONIES/mL PROVIDENCIA RETTGERI   Providencia stuartii - MIC*    AMPICILLIN RESISTANT Resistant     CEFEPIME <=0.12 SENSITIVE Sensitive     CEFTRIAXONE  <=0.25 SENSITIVE Sensitive     CIPROFLOXACIN <=0.25 SENSITIVE Sensitive     GENTAMICIN RESISTANT Resistant     IMIPENEM 2 SENSITIVE Sensitive     NITROFURANTOIN >=512 RESISTANT Resistant     TRIMETH /SULFA  <=20 SENSITIVE Sensitive     AMPICILLIN/SULBACTAM 16 INTERMEDIATE Intermediate     PIP/TAZO <=4 SENSITIVE Sensitive ug/mL    * >=100,000 COLONIES/mL PROVIDENCIA STUARTII     Radiology Studies: No results found.    Kathlen Para, MD, PhD [facility]  Between 7 am - 7 pm I am available, please contact me via Amion (for emergencies) or Securechat (non urgent messages)  Between 7 pm - 7 am I am not available, please contact night coverage MD/APP via Amion

## 2023-07-21 NOTE — Progress Notes (Signed)
ABG obtained and sent to lab. Lab called and notified.

## 2023-07-22 ENCOUNTER — Other Ambulatory Visit: Payer: Self-pay

## 2023-07-22 ENCOUNTER — Inpatient Hospital Stay (HOSPITAL_COMMUNITY): Payer: Self-pay | Admitting: Certified Registered Nurse Anesthetist

## 2023-07-22 ENCOUNTER — Inpatient Hospital Stay (HOSPITAL_COMMUNITY)

## 2023-07-22 ENCOUNTER — Encounter (HOSPITAL_COMMUNITY): Payer: Self-pay | Admitting: Internal Medicine

## 2023-07-22 ENCOUNTER — Inpatient Hospital Stay (HOSPITAL_COMMUNITY)
Admission: EM | Disposition: A | Discharge status: Home Health | Payer: Self-pay | Source: Home / Self Care | Attending: Thoracic Surgery (Cardiothoracic Vascular Surgery)

## 2023-07-22 DIAGNOSIS — R079 Chest pain, unspecified: Secondary | ICD-10-CM | POA: Diagnosis not present

## 2023-07-22 DIAGNOSIS — I10 Essential (primary) hypertension: Secondary | ICD-10-CM

## 2023-07-22 DIAGNOSIS — E039 Hypothyroidism, unspecified: Secondary | ICD-10-CM

## 2023-07-22 DIAGNOSIS — Z951 Presence of aortocoronary bypass graft: Secondary | ICD-10-CM

## 2023-07-22 DIAGNOSIS — I2510 Atherosclerotic heart disease of native coronary artery without angina pectoris: Secondary | ICD-10-CM

## 2023-07-22 DIAGNOSIS — I214 Non-ST elevation (NSTEMI) myocardial infarction: Secondary | ICD-10-CM

## 2023-07-22 DIAGNOSIS — E7800 Pure hypercholesterolemia, unspecified: Secondary | ICD-10-CM

## 2023-07-22 HISTORY — PX: CORONARY ARTERY BYPASS GRAFT: SHX141

## 2023-07-22 HISTORY — PX: INTRAOPERATIVE TRANSESOPHAGEAL ECHOCARDIOGRAM: SHX5062

## 2023-07-22 LAB — POCT I-STAT 7, (LYTES, BLD GAS, ICA,H+H)
Acid-base deficit: 3 mmol/L — ABNORMAL HIGH (ref 0.0–2.0)
Acid-base deficit: 1 mmol/L (ref 0.0–2.0)
Acid-base deficit: 3 mmol/L — ABNORMAL HIGH (ref 0.0–2.0)
Acid-base deficit: 3 mmol/L — ABNORMAL HIGH (ref 0.0–2.0)
Acid-base deficit: 4 mmol/L — ABNORMAL HIGH (ref 0.0–2.0)
Bicarbonate: 21.7 mmol/L (ref 20.0–28.0)
Bicarbonate: 23 mmol/L (ref 20.0–28.0)
Bicarbonate: 22.8 mmol/L (ref 20.0–28.0)
Bicarbonate: 22.3 mmol/L (ref 20.0–28.0)
Bicarbonate: 21.6 mmol/L (ref 20.0–28.0)
Calcium, Ion: 1.01 mmol/L — ABNORMAL LOW (ref 1.15–1.40)
Calcium, Ion: 1.01 mmol/L — ABNORMAL LOW (ref 1.15–1.40)
Calcium, Ion: 1.02 mmol/L — ABNORMAL LOW (ref 1.15–1.40)
Calcium, Ion: 1.31 mmol/L (ref 1.15–1.40)
Calcium, Ion: 1.2 mmol/L (ref 1.15–1.40)
HCT: 23 % — ABNORMAL LOW (ref 39.0–52.0)
HCT: 24 % — ABNORMAL LOW (ref 39.0–52.0)
HCT: 28 % — ABNORMAL LOW (ref 39.0–52.0)
HCT: 25 % — ABNORMAL LOW (ref 39.0–52.0)
HCT: 26 % — ABNORMAL LOW (ref 39.0–52.0)
Hemoglobin: 7.8 g/dL — ABNORMAL LOW (ref 13.0–17.0)
Hemoglobin: 8.2 g/dL — ABNORMAL LOW (ref 13.0–17.0)
Hemoglobin: 9.5 g/dL — ABNORMAL LOW (ref 13.0–17.0)
Hemoglobin: 8.5 g/dL — ABNORMAL LOW (ref 13.0–17.0)
Hemoglobin: 8.8 g/dL — ABNORMAL LOW (ref 13.0–17.0)
O2 Saturation: 100 %
O2 Saturation: 100 %
O2 Saturation: 99 %
O2 Saturation: 98 %
O2 Saturation: 96 %
Patient temperature: 35.1
Patient temperature: 36.4
Patient temperature: 36.8
Potassium: 5.9 mmol/L — ABNORMAL HIGH (ref 3.5–5.1)
Potassium: 5.2 mmol/L — ABNORMAL HIGH (ref 3.5–5.1)
Potassium: 4.7 mmol/L (ref 3.5–5.1)
Potassium: 4.2 mmol/L (ref 3.5–5.1)
Potassium: 4.4 mmol/L (ref 3.5–5.1)
Sodium: 137 mmol/L (ref 135–145)
Sodium: 138 mmol/L (ref 135–145)
Sodium: 139 mmol/L (ref 135–145)
Sodium: 140 mmol/L (ref 135–145)
Sodium: 140 mmol/L (ref 135–145)
TCO2: 23 mmol/L (ref 22–32)
TCO2: 24 mmol/L (ref 22–32)
TCO2: 24 mmol/L (ref 22–32)
TCO2: 24 mmol/L (ref 22–32)
TCO2: 23 mmol/L (ref 22–32)
pCO2 arterial: 34.2 mmHg (ref 32–48)
pCO2 arterial: 34.2 mmHg (ref 32–48)
pCO2 arterial: 38.4 mmHg (ref 32–48)
pCO2 arterial: 40.3 mmHg (ref 32–48)
pCO2 arterial: 40.1 mmHg (ref 32–48)
pH, Arterial: 7.412 (ref 7.35–7.45)
pH, Arterial: 7.435 (ref 7.35–7.45)
pH, Arterial: 7.372 (ref 7.35–7.45)
pH, Arterial: 7.348 — ABNORMAL LOW (ref 7.35–7.45)
pH, Arterial: 7.338 — ABNORMAL LOW (ref 7.35–7.45)
pO2, Arterial: 335 mmHg — ABNORMAL HIGH (ref 83–108)
pO2, Arterial: 260 mmHg — ABNORMAL HIGH (ref 83–108)
pO2, Arterial: 154 mmHg — ABNORMAL HIGH (ref 83–108)
pO2, Arterial: 105 mmHg (ref 83–108)
pO2, Arterial: 88 mmHg (ref 83–108)

## 2023-07-22 LAB — BASIC METABOLIC PANEL WITH GFR
Anion gap: 9 (ref 5–15)
Anion gap: 10 (ref 5–15)
BUN: 37 mg/dL — ABNORMAL HIGH (ref 8–23)
BUN: 27 mg/dL — ABNORMAL HIGH (ref 8–23)
CO2: 23 mmol/L (ref 22–32)
CO2: 21 mmol/L — ABNORMAL LOW (ref 22–32)
Calcium: 9 mg/dL (ref 8.9–10.3)
Calcium: 8.4 mg/dL — ABNORMAL LOW (ref 8.9–10.3)
Chloride: 105 mmol/L (ref 98–111)
Chloride: 108 mmol/L (ref 98–111)
Creatinine, Ser: 1.35 mg/dL — ABNORMAL HIGH (ref 0.61–1.24)
Creatinine, Ser: 1.41 mg/dL — ABNORMAL HIGH (ref 0.61–1.24)
GFR, Estimated: 55 mL/min — ABNORMAL LOW (ref >60)
GFR, Estimated: 53 mL/min — ABNORMAL LOW (ref >60)
Glucose, Bld: 96 mg/dL (ref 70–99)
Glucose, Bld: 129 mg/dL — ABNORMAL HIGH (ref 70–99)
Potassium: 4.4 mmol/L (ref 3.5–5.1)
Potassium: 4.5 mmol/L (ref 3.5–5.1)
Sodium: 137 mmol/L (ref 135–145)
Sodium: 139 mmol/L (ref 135–145)

## 2023-07-22 LAB — CBC
HCT: 40.2 % (ref 39.0–52.0)
HCT: 28.3 % — ABNORMAL LOW (ref 39.0–52.0)
HCT: 27.5 % — ABNORMAL LOW (ref 39.0–52.0)
Hemoglobin: 13.6 g/dL (ref 13.0–17.0)
Hemoglobin: 9.4 g/dL — ABNORMAL LOW (ref 13.0–17.0)
Hemoglobin: 9.3 g/dL — ABNORMAL LOW (ref 13.0–17.0)
MCH: 29.1 pg (ref 26.0–34.0)
MCH: 28.8 pg (ref 26.0–34.0)
MCH: 29.2 pg (ref 26.0–34.0)
MCHC: 33.8 g/dL (ref 30.0–36.0)
MCHC: 33.2 g/dL (ref 30.0–36.0)
MCHC: 33.8 g/dL (ref 30.0–36.0)
MCV: 85.9 fL (ref 80.0–100.0)
MCV: 86.8 fL (ref 80.0–100.0)
MCV: 86.5 fL (ref 80.0–100.0)
Platelets: 92 10*3/uL — ABNORMAL LOW (ref 150–400)
Platelets: 74 10*3/uL — ABNORMAL LOW (ref 150–400)
Platelets: 88 10*3/uL — ABNORMAL LOW (ref 150–400)
RBC: 4.68 MIL/uL (ref 4.22–5.81)
RBC: 3.26 MIL/uL — ABNORMAL LOW (ref 4.22–5.81)
RBC: 3.18 MIL/uL — ABNORMAL LOW (ref 4.22–5.81)
RDW: 14 % (ref 11.5–15.5)
RDW: 14.1 % (ref 11.5–15.5)
RDW: 13.8 % (ref 11.5–15.5)
WBC: 7.2 10*3/uL (ref 4.0–10.5)
WBC: 12.1 10*3/uL — ABNORMAL HIGH (ref 4.0–10.5)
WBC: 14 10*3/uL — ABNORMAL HIGH (ref 4.0–10.5)
nRBC: 0 % (ref 0.0–0.2)
nRBC: 0 % (ref 0.0–0.2)
nRBC: 0 % (ref 0.0–0.2)

## 2023-07-22 LAB — HEMOGLOBIN AND HEMATOCRIT, BLOOD
HCT: 25.3 % — ABNORMAL LOW (ref 39.0–52.0)
Hemoglobin: 8.6 g/dL — ABNORMAL LOW (ref 13.0–17.0)

## 2023-07-22 LAB — POCT I-STAT, CHEM 8
BUN: 34 mg/dL — ABNORMAL HIGH (ref 8–23)
BUN: 33 mg/dL — ABNORMAL HIGH (ref 8–23)
BUN: 31 mg/dL — ABNORMAL HIGH (ref 8–23)
BUN: 31 mg/dL — ABNORMAL HIGH (ref 8–23)
Calcium, Ion: 1.22 mmol/L (ref 1.15–1.40)
Calcium, Ion: 1.2 mmol/L (ref 1.15–1.40)
Calcium, Ion: 1.06 mmol/L — ABNORMAL LOW (ref 1.15–1.40)
Calcium, Ion: 1 mmol/L — ABNORMAL LOW (ref 1.15–1.40)
Chloride: 105 mmol/L (ref 98–111)
Chloride: 104 mmol/L (ref 98–111)
Chloride: 103 mmol/L (ref 98–111)
Chloride: 102 mmol/L (ref 98–111)
Creatinine, Ser: 1.2 mg/dL (ref 0.61–1.24)
Creatinine, Ser: 1.2 mg/dL (ref 0.61–1.24)
Creatinine, Ser: 1.1 mg/dL (ref 0.61–1.24)
Creatinine, Ser: 1 mg/dL (ref 0.61–1.24)
Glucose, Bld: 117 mg/dL — ABNORMAL HIGH (ref 70–99)
Glucose, Bld: 127 mg/dL — ABNORMAL HIGH (ref 70–99)
Glucose, Bld: 119 mg/dL — ABNORMAL HIGH (ref 70–99)
Glucose, Bld: 113 mg/dL — ABNORMAL HIGH (ref 70–99)
HCT: 39 % (ref 39.0–52.0)
HCT: 35 % — ABNORMAL LOW (ref 39.0–52.0)
HCT: 26 % — ABNORMAL LOW (ref 39.0–52.0)
HCT: 27 % — ABNORMAL LOW (ref 39.0–52.0)
Hemoglobin: 13.3 g/dL (ref 13.0–17.0)
Hemoglobin: 11.9 g/dL — ABNORMAL LOW (ref 13.0–17.0)
Hemoglobin: 8.8 g/dL — ABNORMAL LOW (ref 13.0–17.0)
Hemoglobin: 9.2 g/dL — ABNORMAL LOW (ref 13.0–17.0)
Potassium: 3.9 mmol/L (ref 3.5–5.1)
Potassium: 4.1 mmol/L (ref 3.5–5.1)
Potassium: 5.8 mmol/L — ABNORMAL HIGH (ref 3.5–5.1)
Potassium: 5.3 mmol/L — ABNORMAL HIGH (ref 3.5–5.1)
Sodium: 139 mmol/L (ref 135–145)
Sodium: 138 mmol/L (ref 135–145)
Sodium: 137 mmol/L (ref 135–145)
Sodium: 137 mmol/L (ref 135–145)
TCO2: 24 mmol/L (ref 22–32)
TCO2: 22 mmol/L (ref 22–32)
TCO2: 24 mmol/L (ref 22–32)
TCO2: 23 mmol/L (ref 22–32)

## 2023-07-22 LAB — PROTIME-INR
INR: 1.8 — ABNORMAL HIGH (ref 0.8–1.2)
Prothrombin Time: 21.2 s — ABNORMAL HIGH (ref 11.4–15.2)

## 2023-07-22 LAB — GLUCOSE, CAPILLARY
Glucose-Capillary: 108 mg/dL — ABNORMAL HIGH (ref 70–99)
Glucose-Capillary: 124 mg/dL — ABNORMAL HIGH (ref 70–99)
Glucose-Capillary: 132 mg/dL — ABNORMAL HIGH (ref 70–99)
Glucose-Capillary: 125 mg/dL — ABNORMAL HIGH (ref 70–99)
Glucose-Capillary: 119 mg/dL — ABNORMAL HIGH (ref 70–99)
Glucose-Capillary: 136 mg/dL — ABNORMAL HIGH (ref 70–99)

## 2023-07-22 LAB — POCT I-STAT EG7
Acid-base deficit: 2 mmol/L (ref 0.0–2.0)
Bicarbonate: 22.7 mmol/L (ref 20.0–28.0)
Calcium, Ion: 1.04 mmol/L — ABNORMAL LOW (ref 1.15–1.40)
HCT: 24 % — ABNORMAL LOW (ref 39.0–52.0)
Hemoglobin: 8.2 g/dL — ABNORMAL LOW (ref 13.0–17.0)
O2 Saturation: 74 %
Potassium: 5.5 mmol/L — ABNORMAL HIGH (ref 3.5–5.1)
Sodium: 138 mmol/L (ref 135–145)
TCO2: 24 mmol/L (ref 22–32)
pCO2, Ven: 36.9 mmHg — ABNORMAL LOW (ref 44–60)
pH, Ven: 7.398 (ref 7.25–7.43)
pO2, Ven: 39 mmHg (ref 32–45)

## 2023-07-22 LAB — ECHO INTRAOPERATIVE TEE
Height: 65 in
Weight: 2624 [oz_av]

## 2023-07-22 LAB — APTT: aPTT: 37 s — ABNORMAL HIGH (ref 24–36)

## 2023-07-22 LAB — PLATELET COUNT: Platelets: 87 10*3/uL — ABNORMAL LOW (ref 150–400)

## 2023-07-22 LAB — MAGNESIUM: Magnesium: 3.2 mg/dL — ABNORMAL HIGH (ref 1.7–2.4)

## 2023-07-22 MED ORDER — SODIUM CHLORIDE 0.9% FLUSH: 10.0000 mL | INTRAVENOUS | Status: DC | PRN

## 2023-07-22 MED ORDER — DEXTROSE 50 % IV SOLN: 0.0000 mL | INTRAVENOUS | Status: DC | PRN

## 2023-07-22 MED ORDER — ACETAMINOPHEN 500 MG PO TABS
1000.0000 mg | ORAL_TABLET | Freq: Four times a day (QID) | ORAL | Status: AC
  Administered 2023-07-22 – 2023-07-27 (×18): 1000 mg via ORAL
  Filled 2023-07-22 (×21): qty 2

## 2023-07-22 MED ORDER — PANTOPRAZOLE SODIUM 40 MG PO TBEC
40.0000 mg | DELAYED_RELEASE_TABLET | Freq: Every day | ORAL | Status: DC
  Administered 2023-07-24 – 2023-08-03 (×11): 40 mg via ORAL
  Filled 2023-07-22 (×11): qty 1

## 2023-07-22 MED ORDER — ASPIRIN 81 MG PO CHEW
324.0000 mg | CHEWABLE_TABLET | Freq: Once | ORAL | Status: AC
  Administered 2023-07-22: 324 mg via ORAL
  Filled 2023-07-22: qty 4

## 2023-07-22 MED ORDER — VANCOMYCIN HCL IN DEXTROSE 1-5 GM/200ML-% IV SOLN
1000.0000 mg | Freq: Once | INTRAVENOUS | Status: AC
  Administered 2023-07-22: 1000 mg via INTRAVENOUS
  Filled 2023-07-22 (×2): qty 200

## 2023-07-22 MED ORDER — HEMOSTATIC AGENTS (NO CHARGE) OPTIME
TOPICAL | Status: DC | PRN
  Administered 2023-07-22: 1 via TOPICAL

## 2023-07-22 MED ORDER — METOPROLOL TARTRATE 5 MG/5ML IV SOLN
2.5000 mg | INTRAVENOUS | Status: DC | PRN
  Administered 2023-07-24: 3 mg via INTRAVENOUS
  Filled 2023-07-22: qty 5

## 2023-07-22 MED ORDER — CHLORHEXIDINE GLUCONATE CLOTH 2 % EX PADS
6.0000 | MEDICATED_PAD | Freq: Every day | CUTANEOUS | Status: DC
Start: 2023-07-22 — End: 2023-07-26
  Administered 2023-07-22 – 2023-07-26 (×5): 6 via TOPICAL

## 2023-07-22 MED ORDER — ALBUMIN HUMAN 5 % IV SOLN
250.0000 mL | INTRAVENOUS | Status: AC | PRN
  Administered 2023-07-22 (×5): 12.5 g via INTRAVENOUS
  Filled 2023-07-22 (×2): qty 250

## 2023-07-22 MED ORDER — BISACODYL 5 MG PO TBEC
10.0000 mg | DELAYED_RELEASE_TABLET | Freq: Every day | ORAL | Status: DC
  Administered 2023-07-23 – 2023-08-02 (×7): 10 mg via ORAL
  Filled 2023-07-22 (×9): qty 2

## 2023-07-22 MED ORDER — SODIUM CHLORIDE (PF) 0.9 % IJ SOLN: OROMUCOSAL | Status: DC | PRN

## 2023-07-22 MED ORDER — LIDOCAINE HCL (CARDIAC) PF 100 MG/5ML IV SOSY
PREFILLED_SYRINGE | INTRAVENOUS | Status: DC | PRN
Start: 2023-07-22 — End: 2023-07-22
  Administered 2023-07-22: 100 mg via INTRATRACHEAL

## 2023-07-22 MED ORDER — INSULIN REGULAR(HUMAN) IN NACL 100-0.9 UT/100ML-% IV SOLN: INTRAVENOUS | Status: DC

## 2023-07-22 MED ORDER — LACTATED RINGERS IV SOLN: INTRAVENOUS | Status: AC

## 2023-07-22 MED ORDER — PANTOPRAZOLE SODIUM 40 MG IV SOLR
40.0000 mg | Freq: Every day | INTRAVENOUS | Status: AC
  Administered 2023-07-22 – 2023-07-23 (×2): 40 mg via INTRAVENOUS
  Filled 2023-07-22 (×2): qty 10

## 2023-07-22 MED ORDER — NOREPINEPHRINE 4 MG/250ML-% IV SOLN
0.0000 ug/min | INTRAVENOUS | Status: AC
  Administered 2023-07-24 – 2023-07-25 (×2): 2 ug/min via INTRAVENOUS
  Filled 2023-07-22 (×2): qty 250

## 2023-07-22 MED ORDER — SODIUM CHLORIDE 0.9% FLUSH
10.0000 mL | Freq: Two times a day (BID) | INTRAVENOUS | Status: DC
  Administered 2023-07-22: 40 mL
  Administered 2023-07-23 – 2023-07-26 (×7): 10 mL

## 2023-07-22 MED ORDER — ORAL CARE MOUTH RINSE: 15.0000 mL | OROMUCOSAL | Status: DC | PRN

## 2023-07-22 MED ORDER — HEPARIN SODIUM (PORCINE) 1000 UNIT/ML IJ SOLN
INTRAMUSCULAR | Status: AC
  Filled 2023-07-22: qty 1

## 2023-07-22 MED ORDER — PROPOFOL 10 MG/ML IV BOLUS
INTRAVENOUS | Status: AC
  Filled 2023-07-22: qty 20

## 2023-07-22 MED ORDER — 0.9 % SODIUM CHLORIDE (POUR BTL) OPTIME
TOPICAL | Status: DC | PRN
  Administered 2023-07-22: 5000 mL

## 2023-07-22 MED ORDER — MIDAZOLAM HCL 2 MG/2ML IJ SOLN
2.0000 mg | INTRAMUSCULAR | Status: DC | PRN
  Filled 2023-07-22: qty 2

## 2023-07-22 MED ORDER — SODIUM CHLORIDE 0.9 % IV SOLN: 250.0000 mL | INTRAVENOUS | Status: AC

## 2023-07-22 MED ORDER — ACETAMINOPHEN 160 MG/5ML PO SOLN
650.0000 mg | Freq: Once | ORAL | Status: AC
  Administered 2023-07-22: 650 mg
  Filled 2023-07-22 (×2): qty 20.3

## 2023-07-22 MED ORDER — MIDAZOLAM HCL 2 MG/2ML IJ SOLN
INTRAMUSCULAR | Status: AC
  Filled 2023-07-22: qty 2

## 2023-07-22 MED ORDER — FENTANYL CITRATE (PF) 250 MCG/5ML IJ SOLN
INTRAMUSCULAR | Status: DC | PRN
  Administered 2023-07-22: 100 ug via INTRAVENOUS
  Administered 2023-07-22 (×3): 50 ug via INTRAVENOUS
  Administered 2023-07-22: 100 ug via INTRAVENOUS

## 2023-07-22 MED ORDER — NOREPINEPHRINE BITARTRATE 1 MG/ML IV SOLN
INTRAVENOUS | Status: DC | PRN
  Administered 2023-07-22: .5 mL via INTRAVENOUS

## 2023-07-22 MED ORDER — NITROGLYCERIN IN D5W 200-5 MCG/ML-% IV SOLN: 0.0000 ug/min | INTRAVENOUS | Status: DC

## 2023-07-22 MED ORDER — FENTANYL CITRATE (PF) 250 MCG/5ML IJ SOLN
INTRAMUSCULAR | Status: AC
  Filled 2023-07-22: qty 5

## 2023-07-22 MED ORDER — POTASSIUM CHLORIDE 10 MEQ/50ML IV SOLN: 10.0000 meq | INTRAVENOUS | Status: DC

## 2023-07-22 MED ORDER — BISACODYL 10 MG RE SUPP: 10.0000 mg | Freq: Every day | RECTAL | Status: DC

## 2023-07-22 MED ORDER — PHENYLEPHRINE 80 MCG/ML (10ML) SYRINGE FOR IV PUSH (FOR BLOOD PRESSURE SUPPORT)
PREFILLED_SYRINGE | INTRAVENOUS | Status: DC | PRN
  Administered 2023-07-22: 80 ug via INTRAVENOUS
  Administered 2023-07-22: 40 ug via INTRAVENOUS

## 2023-07-22 MED ORDER — METOCLOPRAMIDE HCL 5 MG/ML IJ SOLN
10.0000 mg | Freq: Four times a day (QID) | INTRAMUSCULAR | Status: AC
  Administered 2023-07-22 – 2023-07-23 (×6): 10 mg via INTRAVENOUS
  Filled 2023-07-22 (×6): qty 2

## 2023-07-22 MED ORDER — ASPIRIN 81 MG PO CHEW
324.0000 mg | CHEWABLE_TABLET | Freq: Every day | ORAL | Status: DC
  Filled 2023-07-22: qty 4

## 2023-07-22 MED ORDER — ALBUMIN HUMAN 5 % IV SOLN: INTRAVENOUS | Status: DC | PRN

## 2023-07-22 MED ORDER — CHLORHEXIDINE GLUCONATE 0.12 % MT SOLN
15.0000 mL | OROMUCOSAL | Status: AC
  Administered 2023-07-22: 15 mL via OROMUCOSAL
  Filled 2023-07-22: qty 15

## 2023-07-22 MED ORDER — TRAMADOL HCL 50 MG PO TABS
50.0000 mg | ORAL_TABLET | ORAL | Status: DC | PRN
  Administered 2023-07-23 – 2023-07-24 (×2): 100 mg via ORAL
  Administered 2023-07-24: 50 mg via ORAL
  Administered 2023-07-24 – 2023-07-25 (×2): 100 mg via ORAL
  Filled 2023-07-22 (×6): qty 2

## 2023-07-22 MED ORDER — ASPIRIN 325 MG PO TBEC
325.0000 mg | DELAYED_RELEASE_TABLET | Freq: Every day | ORAL | Status: DC
  Administered 2023-07-23 – 2023-07-27 (×5): 325 mg via ORAL
  Filled 2023-07-22 (×5): qty 1

## 2023-07-22 MED ORDER — PROTAMINE SULFATE 10 MG/ML IV SOLN
INTRAVENOUS | Status: DC | PRN
  Administered 2023-07-22: 240 mg via INTRAVENOUS
  Administered 2023-07-22: 50 mg via INTRAVENOUS

## 2023-07-22 MED ORDER — SODIUM CHLORIDE 0.9% FLUSH: 3.0000 mL | Freq: Two times a day (BID) | INTRAVENOUS | Status: DC

## 2023-07-22 MED ORDER — CEFAZOLIN SODIUM-DEXTROSE 2-4 GM/100ML-% IV SOLN
2.0000 g | Freq: Three times a day (TID) | INTRAVENOUS | Status: AC
  Administered 2023-07-22 – 2023-07-24 (×6): 2 g via INTRAVENOUS
  Filled 2023-07-22 (×7): qty 100

## 2023-07-22 MED ORDER — SODIUM CHLORIDE 0.9 % IV SOLN: INTRAVENOUS | Status: AC

## 2023-07-22 MED ORDER — SODIUM CHLORIDE 0.45 % IV SOLN: INTRAVENOUS | Status: AC | PRN

## 2023-07-22 MED ORDER — MIDAZOLAM HCL (PF) 5 MG/ML IJ SOLN
INTRAMUSCULAR | Status: DC | PRN
  Administered 2023-07-22 (×2): 1 mg via INTRAVENOUS

## 2023-07-22 MED ORDER — SUGAMMADEX SODIUM 200 MG/2ML IV SOLN
INTRAVENOUS | Status: DC | PRN
  Administered 2023-07-22: 148.8 mg via INTRAVENOUS

## 2023-07-22 MED ORDER — ROCURONIUM BROMIDE 10 MG/ML (PF) SYRINGE
PREFILLED_SYRINGE | INTRAVENOUS | Status: DC | PRN
  Administered 2023-07-22 (×2): 100 mg via INTRAVENOUS

## 2023-07-22 MED ORDER — PROPOFOL 10 MG/ML IV BOLUS
INTRAVENOUS | Status: DC | PRN
  Administered 2023-07-22: 20 mg via INTRAVENOUS
  Administered 2023-07-22: 100 mg via INTRAVENOUS

## 2023-07-22 MED ORDER — METOPROLOL TARTRATE 12.5 MG HALF TABLET
12.5000 mg | ORAL_TABLET | Freq: Two times a day (BID) | ORAL | Status: DC
  Filled 2023-07-22 (×3): qty 1

## 2023-07-22 MED ORDER — PROPOFOL 500 MG/50ML IV EMUL
INTRAVENOUS | Status: DC | PRN
  Administered 2023-07-22: 50 ug/kg/min via INTRAVENOUS

## 2023-07-22 MED ORDER — LACTATED RINGERS IV SOLN: INTRAVENOUS | Status: DC | PRN

## 2023-07-22 MED ORDER — CHLORHEXIDINE GLUCONATE 0.12 % MT SOLN: 15.0000 mL | Freq: Once | OROMUCOSAL | Status: DC

## 2023-07-22 MED ORDER — SODIUM CHLORIDE 0.9% FLUSH: 3.0000 mL | INTRAVENOUS | Status: DC | PRN

## 2023-07-22 MED ORDER — METHADONE HCL IV SYRINGE 10 MG/ML FOR CABG
0.2000 mg/kg | Freq: Once | INTRAMUSCULAR | Status: AC
  Administered 2023-07-22: 12 mg via INTRAVENOUS
  Filled 2023-07-22: qty 1.2

## 2023-07-22 MED ORDER — DEXMEDETOMIDINE HCL IN NACL 400 MCG/100ML IV SOLN
0.0000 ug/kg/h | INTRAVENOUS | Status: DC
  Administered 2023-07-22: 0.2 ug/kg/h via INTRAVENOUS
  Filled 2023-07-22: qty 100

## 2023-07-22 MED ORDER — PHENYLEPHRINE HCL-NACL 20-0.9 MG/250ML-% IV SOLN: 0.0000 ug/min | INTRAVENOUS | Status: DC

## 2023-07-22 MED ORDER — DOCUSATE SODIUM 100 MG PO CAPS
200.0000 mg | ORAL_CAPSULE | Freq: Every day | ORAL | Status: DC
  Administered 2023-07-23 – 2023-08-03 (×9): 200 mg via ORAL
  Filled 2023-07-22 (×9): qty 2

## 2023-07-22 MED ORDER — LACTATED RINGERS IV SOLN: INTRAVENOUS | Status: DC

## 2023-07-22 MED ORDER — METOPROLOL TARTRATE 25 MG/10 ML ORAL SUSPENSION: 12.5000 mg | Freq: Two times a day (BID) | ORAL | Status: DC

## 2023-07-22 MED ORDER — MAGNESIUM SULFATE 4 GM/100ML IV SOLN
4.0000 g | Freq: Once | INTRAVENOUS | Status: AC
  Administered 2023-07-22: 4 g via INTRAVENOUS
  Filled 2023-07-22 (×2): qty 100

## 2023-07-22 MED ORDER — NITROGLYCERIN 0.2 MG/ML ON CALL CATH LAB
INTRAVENOUS | Status: DC | PRN
  Administered 2023-07-22: 20 ug via INTRAVENOUS
  Administered 2023-07-22: 10 ug via INTRAVENOUS
  Administered 2023-07-22 (×2): 20 ug via INTRAVENOUS

## 2023-07-22 MED ORDER — CALCIUM CHLORIDE 10 % IV SOLN
1.0000 g | Freq: Once | INTRAVENOUS | Status: AC
  Administered 2023-07-22: 1 g via INTRAVENOUS

## 2023-07-22 MED ORDER — ORAL CARE MOUTH RINSE: 15.0000 mL | Freq: Once | OROMUCOSAL | Status: DC

## 2023-07-22 MED ORDER — PLASMA-LYTE A IV SOLN
INTRAVENOUS | Status: DC | PRN
  Administered 2023-07-22: 500 mL via INTRAVASCULAR

## 2023-07-22 MED ORDER — ACETAMINOPHEN 160 MG/5ML PO SOLN
1000.0000 mg | Freq: Four times a day (QID) | ORAL | Status: AC
  Filled 2023-07-22: qty 40.6

## 2023-07-22 MED ORDER — HEPARIN SODIUM (PORCINE) 1000 UNIT/ML IJ SOLN
INTRAMUSCULAR | Status: DC | PRN
  Administered 2023-07-22: 2000 [IU] via INTRAVENOUS
  Administered 2023-07-22: 22000 [IU] via INTRAVENOUS

## 2023-07-22 MED ORDER — OXYCODONE HCL 5 MG PO TABS
5.0000 mg | ORAL_TABLET | ORAL | Status: DC | PRN
  Administered 2023-07-22 – 2023-07-25 (×14): 10 mg via ORAL
  Administered 2023-07-25 – 2023-07-26 (×3): 5 mg via ORAL
  Administered 2023-07-26: 10 mg via ORAL
  Administered 2023-07-27 (×4): 5 mg via ORAL
  Administered 2023-07-27: 10 mg via ORAL
  Administered 2023-07-28: 5 mg via ORAL
  Administered 2023-07-28 (×2): 10 mg via ORAL
  Administered 2023-07-28 (×2): 5 mg via ORAL
  Administered 2023-07-29 – 2023-07-30 (×2): 10 mg via ORAL
  Administered 2023-07-30: 5 mg via ORAL
  Administered 2023-07-30 – 2023-08-02 (×14): 10 mg via ORAL
  Administered 2023-08-03: 5 mg via ORAL
  Filled 2023-07-22 (×2): qty 1
  Filled 2023-07-22 (×15): qty 2
  Filled 2023-07-22: qty 1
  Filled 2023-07-22 (×8): qty 2
  Filled 2023-07-22: qty 1
  Filled 2023-07-22 (×7): qty 2
  Filled 2023-07-22: qty 1
  Filled 2023-07-22 (×3): qty 2
  Filled 2023-07-22: qty 1
  Filled 2023-07-22 (×2): qty 2
  Filled 2023-07-22 (×2): qty 1
  Filled 2023-07-22 (×6): qty 2

## 2023-07-22 MED ORDER — ONDANSETRON HCL 4 MG/2ML IJ SOLN
4.0000 mg | Freq: Four times a day (QID) | INTRAMUSCULAR | Status: DC | PRN
  Administered 2023-07-25 – 2023-07-31 (×3): 4 mg via INTRAVENOUS
  Filled 2023-07-22 (×3): qty 2

## 2023-07-22 MED ORDER — MORPHINE SULFATE (PF) 2 MG/ML IV SOLN
1.0000 mg | INTRAVENOUS | Status: DC | PRN
  Administered 2023-07-22 (×2): 4 mg via INTRAVENOUS
  Administered 2023-07-22: 2 mg via INTRAVENOUS
  Administered 2023-07-23 (×2): 4 mg via INTRAVENOUS
  Filled 2023-07-22: qty 2
  Filled 2023-07-22: qty 1
  Filled 2023-07-22: qty 2
  Filled 2023-07-22: qty 1
  Filled 2023-07-22 (×2): qty 2

## 2023-07-22 MED FILL — Heparin Sodium (Porcine) Inj 1000 Unit/ML: INTRAMUSCULAR | Qty: 10 | Status: AC

## 2023-07-22 NOTE — Anesthesia Procedure Notes (Signed)
 Arterial Line Insertion Start/End6/07/2023 10:30 AM, 07/22/2023 10:35 AM Performed by: Robert Chimes, CRNA, CRNA  Patient location: Pre-op. Preanesthetic checklist: patient identified, IV checked, site marked, risks and benefits discussed, surgical consent, monitors and equipment checked, pre-op evaluation, timeout performed and anesthesia consent Lidocaine  1% used for infiltration Left, radial was placed Catheter size: 20 G Hand hygiene performed  and maximum sterile barriers used   Attempts: 1 Procedure performed using ultrasound guided technique. Ultrasound Notes:anatomy identified, needle tip was noted to be adjacent to the nerve/plexus identified and no ultrasound evidence of intravascular and/or intraneural injection Following insertion, dressing applied and Biopatch. Post procedure assessment: normal and unchanged  Patient tolerated the procedure well with no immediate complications.

## 2023-07-22 NOTE — Progress Notes (Signed)
 PROGRESS NOTE  Mark [name] ZOX:[id number] DOB: [birth year] DOA: 07/16/2023 PCP: Benedetto Brady, MD   LOS: 3 days   Brief Narrative / Interim history: 73 y.o. male with medical history significant of paroxysmal A-fib not on anticoagulation, chronic kidney disease stage IIIa, bladder cancer status post urostomy, colon cancer in remission, GERD, hypertension, hyperlipidemia, prediabetes presented with chest pain.  On presentation, high-sensitivity troponins were 18 and 23. EKG was nonischemic. UA was suggestive of UTI.  He was started on IV antibiotics.  Cardiology was consulted.  Coronary CTA showed moderate three-vessel CAD with high calcium score. He underwent coronary cath on 6/3 showing Dist LM to Ost LAD lesion is 65% stenosed. 65% distal left main lesion with mild diffuse disease of the LAD and left circumflex.  Is being evaluated by CT surgery for CABG, planned for tomorrow  Subjective / 24h Interval events: Doing well, no chest pain, no palpitations.  Awaiting CABG this morning  Assesement and Plan: Principal problem Chest pain, CAD - Cardiology following.  Coronary CTA showed moderate three-vessel CAD and coronary calcium score of 891 with other abnormalities.  Continue aspirin.  2D echo showed EF of 55 to 60% with mild mitral valve regurgitation  - he underwent cardiac cath 6/3 showing Dist LM to Ost LAD lesion is 65% stenosed. 65% distal left main lesion with mild diffuse disease of the LAD and left circumflex. - Bypass surgery this morning per cardiothoracic surgery   Active problems Hypertension - BP stable.  Continue amlodipine and Coreg.  Paroxysmal A-fib - Remote history.  Not on anticoagulation currently.  Currently rate controlled  Prediabetes -CBGs with SSI.  A1c 6.1.  Possible UTI - Present on admission; possibly related to urostomy -on ceftriaxone . Cultures showing Continue Rocephin .  Urine cultures growing providencia, completed treatment course while  hospitalized  History of bladder cancer status post urostomy - Outpatient follow-up with urology  Scheduled Meds:  amLODipine  5 mg Oral Daily   aspirin EC  81 mg Oral Daily   bisacodyl  5 mg Oral Once   carvedilol  6.25 mg Oral BID WC   DULoxetine   30 mg Oral Daily   enoxaparin (LOVENOX) injection  40 mg Subcutaneous Q24H   epinephrine   0-10 mcg/min Intravenous To OR   ezetimibe  10 mg Oral Daily   heparin sodium (porcine) 2,500 Units, papaverine 30 mg in electrolyte-A (PLASMALYTE-A PH 7.4) 500 mL irrigation   Irrigation To OR   insulin aspart  0-15 Units Subcutaneous TID WC   insulin aspart  0-5 Units Subcutaneous QHS   insulin   Intravenous To OR   magnesium sulfate  40 mEq Other To OR   pantoprazole   40 mg Oral Daily   phenylephrine   30-200 mcg/min Intravenous To OR   polyethylene glycol  17 g Oral Daily   potassium chloride  80 mEq Other To OR   rosuvastatin  40 mg Oral Daily   sodium chloride  flush  3 mL Intravenous Q12H   tranexamic acid  15 mg/kg Intravenous To OR   tranexamic acid  2 mg/kg Intracatheter To OR   Continuous Infusions:   ceFAZolin (ANCEF) IV      ceFAZolin (ANCEF) IV     dexmedetomidine     heparin 30,000 units/NS 1000 mL solution for CELLSAVER     milrinone     nitroGLYCERIN     norepinephrine     tranexamic acid (CYKLOKAPRON) 2,500 mg in sodium chloride  0.9 % 250 mL (10 mg/mL) infusion  vancomycin      PRN Meds:.acetaminophen , alum & mag hydroxide-simeth, morphine  injection, nitroGLYCERIN, ondansetron  (ZOFRAN ) IV, oxyCODONE , sodium chloride  flush, temazepam   Current Outpatient Medications  Medication Instructions   Ascorbic Acid (VITAMIN C PO) 1 tablet, Oral, Daily   aspirin EC 81 mg, Oral, Every morning, Swallow whole.   aspirin 325 mg, Oral, As needed   bisacodyl (DULCOLAX) 5 mg, Oral, Daily PRN   Cyanocobalamin (VITAMIN B12 PO) 1 tablet, Oral, Daily   DULoxetine  (CYMBALTA ) 30 mg, Oral, Daily   ferrous sulfate 324 mg, Oral, Daily    MAGNESIUM PO 1 tablet, Oral, Daily   metFORMIN (GLUCOPHAGE) 500 mg, Oral, As needed   Multiple Vitamins-Minerals (MULTIVITAMINS THER. W/MINERALS) TABS 1 tablet, Oral, Daily,     nitrofurantoin (MACRODANTIN) 100 mg, Oral, Daily   Oxycodone  HCl 10 MG TABS 1 tablet, Oral, Every 6 hours PRN   temazepam  (RESTORIL ) 30 mg, Oral, Daily at bedtime, For sleep.     Diet Orders (From admission, onward)     Start     Ordered   07/19/23 1541  Diet Heart Room service appropriate? Yes; Fluid consistency: Thin  Diet effective now       Question Answer Comment  Room service appropriate? Yes   Fluid consistency: Thin      07/19/23 1540            DVT prophylaxis: enoxaparin (LOVENOX) injection 40 mg Start: 07/16/23 1615   Lab Results  Component Value Date   PLT 92 (L) 07/22/2023      Code Status: Prior  Family Communication: no family at bedside  Status is: Inpatient Remains inpatient appropriate because: severity of illness  Level of care: Telemetry Cardiac  Consultants:  Cardiology  Cardiothoracic surgery   Objective: Vitals:   07/21/23 2043 07/21/23 2351 07/22/23 0519 07/22/23 0736  BP: 113/71 116/62 124/66 110/70  Pulse: 68 62 62 64  Resp: 18 18 18 18   Temp: (!) 97.5 F (36.4 C) (!) 97.5 F (36.4 C) 97.6 F (36.4 C) 98.2 F (36.8 C)  TempSrc: Oral Oral Oral Oral  SpO2: 96% 94% 97% 99%  Weight:   74.4 kg   Height:        Intake/Output Summary (Last 24 hours) at 07/22/2023 0905 Last data filed at 07/22/2023 1610 Gross per 24 hour  Intake 600 ml  Output 2750 ml  Net -2150 ml   Wt Readings from Last 3 Encounters:  07/22/23 74.4 kg  08/10/22 74.8 kg  07/14/22 74.8 kg    Examination:  Constitutional: NAD   Data Reviewed: I have independently reviewed following labs and imaging studies   CBC Recent Labs  Lab 07/17/23 0446 07/18/23 0519 07/19/23 0602 07/20/23 0415 07/21/23 1027 07/22/23 0513  WBC 3.8* 9.8 5.7 4.6 6.3 7.2  HGB 13.4 13.1 12.8* 12.0*  12.6* 13.6  HCT 40.1 39.8 38.2* 34.9* 37.9* 40.2  PLT 138* 137* 122* 101* 85* 92*  MCV 85.7 87.1 85.5 84.5 86.5 85.9  MCH 28.6 28.7 28.6 29.1 28.8 29.1  MCHC 33.4 32.9 33.5 34.4 33.2 33.8  RDW 14.0 13.9 13.9 13.4 13.8 14.0  LYMPHSABS 1.0 1.4 2.2 0.8  --   --   MONOABS 0.0* 0.5 0.6 0.1  --   --   EOSABS 0.0 0.0 0.1 0.0  --   --   BASOSABS 0.0 0.0 0.1 0.0  --   --     Recent Labs  Lab 07/17/23 0446 07/18/23 0519 07/19/23 0602 07/20/23 0415 07/21/23 1027 07/21/23 1444 07/22/23 9604  NA 136 133* 137 135 136  --  137  K 4.6 4.2 4.2 3.9 3.8  --  4.4  CL 103 103 108 107 106  --  105  CO2 22 20* 21* 18* 20*  --  23  GLUCOSE 157* 135* 101* 157* 122*  --  96  BUN 26* 36* 38* 40* 39*  --  37*  CREATININE 1.49* 1.67* 1.53* 1.42* 1.43*  --  1.35*  CALCIUM 10.1 9.0 8.7* 8.4* 8.7*  --  9.0  AST 29  --   --   --  45*  --   --   ALT 24  --   --   --  36  --   --   ALKPHOS 88  --   --   --  70  --   --   BILITOT 0.9  --   --   --  0.4  --   --   ALBUMIN 3.3*  --   --   --  2.9*  --   --   MG 1.8 2.0 1.9 2.0  --   --   --   INR  --   --   --   --   --  1.3*  --   HGBA1C 6.1*  --   --   --   --   --   --     ------------------------------------------------------------------------------------------------------------------ No results for input(s): "CHOL", "HDL", "LDLCALC", "TRIG", "CHOLHDL", "LDLDIRECT" in the last 72 hours.   Lab Results  Component Value Date   HGBA1C 6.1 (H) 07/17/2023   ------------------------------------------------------------------------------------------------------------------ No results for input(s): "TSH", "T4TOTAL", "T3FREE", "THYROIDAB" in the last 72 hours.  Invalid input(s): "FREET3"  Cardiac Enzymes No results for input(s): "CKMB", "TROPONINI", "MYOGLOBIN" in the last 168 hours.  Invalid input(s): "CK" ------------------------------------------------------------------------------------------------------------------ No results found for:  "BNP"  CBG: Recent Labs  Lab 07/21/23 0800 07/21/23 1207 07/21/23 1729 07/21/23 2110 07/22/23 0740  GLUCAP 146* 106* 97 129* 108*    Recent Results (from the past 240 hours)  Urine Culture     Status: Abnormal   Collection Time: 07/16/23  1:25 PM   Specimen: Urine, Random  Result Value Ref Range Status   Specimen Description URINE, RANDOM  Final   Special Requests   Final    NONE Reflexed from [id number] Performed at [facility], [address], [place], [place] [address]    Culture (A)  Final    >=100,000 COLONIES/mL PROVIDENCIA STUARTII 60,000 COLONIES/mL PROVIDENCIA RETTGERI    Report Status 07/20/2023 FINAL  Final   Organism ID, Bacteria PROVIDENCIA STUARTII (A)  Final   Organism ID, Bacteria PROVIDENCIA RETTGERI (A)  Final      Susceptibility   Providencia rettgeri - MIC*    AMPICILLIN >=32 RESISTANT Resistant     CEFEPIME <=0.12 SENSITIVE Sensitive     CIPROFLOXACIN 0.5 INTERMEDIATE Intermediate     GENTAMICIN <=1 SENSITIVE Sensitive     NITROFURANTOIN RESISTANT Resistant     AMPICILLIN/SULBACTAM >=32 RESISTANT Resistant     PIP/TAZO >=128 RESISTANT Resistant ug/mL    * 60,000 COLONIES/mL PROVIDENCIA RETTGERI   Providencia stuartii - MIC*    AMPICILLIN RESISTANT Resistant     CEFEPIME <=0.12 SENSITIVE Sensitive     CEFTRIAXONE  <=0.25 SENSITIVE Sensitive     CIPROFLOXACIN <=0.25 SENSITIVE Sensitive     GENTAMICIN RESISTANT Resistant     IMIPENEM 2 SENSITIVE Sensitive     NITROFURANTOIN >=512 RESISTANT Resistant     TRIMETH /SULFA  <=  20 SENSITIVE Sensitive     AMPICILLIN/SULBACTAM 16 INTERMEDIATE Intermediate     PIP/TAZO <=4 SENSITIVE Sensitive ug/mL    * >=100,000 COLONIES/mL PROVIDENCIA STUARTII  Surgical pcr screen     Status: None   Collection Time: 07/21/23  4:54 PM   Specimen: Nasal Mucosa; Nasal Swab  Result Value Ref Range Status   MRSA, PCR NEGATIVE NEGATIVE Final   Staphylococcus aureus NEGATIVE NEGATIVE Final    Comment: (NOTE) The Xpert SA  Assay (FDA approved for NASAL specimens in patients 76 years of age and older), is one component of a comprehensive surveillance program. It is not intended to diagnose infection nor to guide or monitor treatment. Performed at [facility], [address], [place], [place] [address]      Radiology Studies: VAS US  DOPPLER PRE CABG Result Date: 07/21/2023 PREOPERATIVE VASCULAR EVALUATION Patient Name:  NANCY MANUELE  Date of Exam:   07/21/2023 Medical Rec #: [id number]        Accession #:    [id number] Date of Birth: 1950-02-27        Patient Gender: M Patient Age:   45 years Exam Location:  [facility] Procedure:      VAS US  DOPPLER PRE CABG Referring Phys: Landon Pinion HENDRICKSON --------------------------------------------------------------------------------  Indications:  Pre-CABG. Risk Factors: Hypertension, hyperlipidemia. Performing Technologist: Franky Ivanoff Sturdivant-Jones RDMS, RVT  Examination Guidelines: A complete evaluation includes B-mode imaging, spectral Doppler, color Doppler, and power Doppler as needed of all accessible portions of each vessel. Bilateral testing is considered an integral part of a complete examination. Limited examinations for reoccurring indications may be performed as noted.  Right Carotid Findings: +----------+--------+--------+--------+--------+------------------+           PSV cm/sEDV cm/sStenosisDescribeComments           +----------+--------+--------+--------+--------+------------------+ CCA Prox  98      11                                         +----------+--------+--------+--------+--------+------------------+ CCA Distal70      13                                         +----------+--------+--------+--------+--------+------------------+ ICA Prox  57      18                      intimal thickening +----------+--------+--------+--------+--------+------------------+ ICA Distal68      23                                          +----------+--------+--------+--------+--------+------------------+ ECA       110     12                                         +----------+--------+--------+--------+--------+------------------+ +----------+--------+-------+--------+------------+           PSV cm/sEDV cmsDescribeArm Pressure +----------+--------+-------+--------+------------+ [id number]            Stenotic             +----------+--------+-------+--------+------------+ +---------+--------+--+--------+-+------------------+ VertebralPSV cm/s21EDV cm/s7abnormal antegrade +---------+--------+--+--------+-+------------------+ Left Carotid Findings: +----------+--------+--------+--------+--------+------------------+  PSV cm/sEDV cm/sStenosisDescribeComments           +----------+--------+--------+--------+--------+------------------+ CCA Prox  69      11                                         +----------+--------+--------+--------+--------+------------------+ CCA Distal63      14                      intimal thickening +----------+--------+--------+--------+--------+------------------+ ICA Prox  58      16      1-39%           intimal thickening +----------+--------+--------+--------+--------+------------------+ ICA Distal73      22                                         +----------+--------+--------+--------+--------+------------------+ ECA       142     18                                         +----------+--------+--------+--------+--------+------------------+ +----------+--------+--------+----------------+------------+ SubclavianPSV cm/sEDV cm/sDescribe        Arm Pressure +----------+--------+--------+----------------+------------+           170             Multiphasic, WNL             +----------+--------+--------+----------------+------------+ +---------+--------+--+--------+-+---------+ VertebralPSV cm/s32EDV cm/s9Antegrade  +---------+--------+--+--------+-+---------+  ABI Findings: +------------------+-----+---------+ Rt Pressure (mmHg)IndexWaveform  +------------------+-----+---------+ 127                    triphasic +------------------+-----+---------+ 151               1.19 triphasic +------------------+-----+---------+ 154               1.21 triphasic +------------------+-----+---------+ +------------------+-----+---------+ Lt Pressure (mmHg)IndexWaveform  +------------------+-----+---------+ 126                    triphasic +------------------+-----+---------+ 135               1.06 triphasic +------------------+-----+---------+ 139               1.09 triphasic +------------------+-----+---------+ +-------+---------------+ ABI/TBIToday's ABI/TBI +-------+---------------+ Right  1.2             +-------+---------------+ Left   1.0             +-------+---------------+  Right Doppler Findings: +--------+--------+---------+ Site    PressureDoppler   +--------+--------+---------+ [id number]     triphasic +--------+--------+---------+ Radial          triphasic +--------+--------+---------+ Ulnar           triphasic +--------+--------+---------+  Left Doppler Findings: +--------+--------+---------+ Site    PressureDoppler   +--------+--------+---------+ [id number]     triphasic +--------+--------+---------+ Radial          triphasic +--------+--------+---------+ Ulnar           triphasic +--------+--------+---------+   Summary: Right Carotid: The extracranial vessels were near-normal with only minimal wall                thickening or plaque. Left Carotid: Velocities in the left ICA are consistent with a 1-39% stenosis. Vertebrals:  Bilateral vertebral arteries demonstrate antegrade flow. Subclavians: Right  subclavian artery was stenotic. Normal flow hemodynamics were              seen in the left subclavian artery. Bilateral Extremity: Doppler waveforms remain  within normal limits with compression bilaterally for the radial arteries. Doppler waveforms remain within normal limits with compression bilaterally for the ulnar arteries.  Electronically signed by Runell Countryman on 07/21/2023 at 4:43:34 PM.    Final       Kathlen Para, MD, PhD [facility]  Between 7 am - 7 pm I am available, please contact me via Amion (for emergencies) or Securechat (non urgent messages)  Between 7 pm - 7 am I am not available, please contact night coverage MD/APP via Amion

## 2023-07-22 NOTE — Anesthesia Procedure Notes (Signed)
 Procedure Name: Intubation Date/Time: 07/22/2023 11:35 AM  Performed by: Robert Chimes, CRNAPre-anesthesia Checklist: Patient identified, Emergency Drugs available, Suction available and Patient being monitored Patient Re-evaluated:Patient Re-evaluated prior to induction Oxygen Delivery Method: Circle system utilized Preoxygenation: Pre-oxygenation with 100% oxygen Induction Type: IV induction Ventilation: Mask ventilation without difficulty Laryngoscope Size: Mac and 3 Grade View: Grade II Tube type: Oral Tube size: 8.0 mm Number of attempts: 1 Airway Equipment and Method: Stylet and Oral airway Placement Confirmation: ETT inserted through vocal cords under direct vision, positive ETCO2 and breath sounds checked- equal and bilateral Secured at: 23 cm Tube secured with: Tape Dental Injury: Teeth and Oropharynx as per pre-operative assessment

## 2023-07-22 NOTE — Anesthesia Postprocedure Evaluation (Signed)
 Anesthesia Post Note  Patient: Mark [name]  Procedure(s) Performed: CORONARY ARTERY BYPASS GRAFTING X 2, USING LEFT INTERNAL MAMMARY ARTERY AND RIGHT ENDOSCOPIC HARVESTED GREATER SAPHENOUS VEIN (Chest) ECHOCARDIOGRAM, TRANSESOPHAGEAL, INTRAOPERATIVE     Patient location during evaluation: SICU Anesthesia Type: General Level of consciousness: sedated Pain management: pain level controlled Vital Signs Assessment: post-procedure vital signs reviewed and stable Respiratory status: patient remains intubated per anesthesia plan Cardiovascular status: stable Postop Assessment: no apparent nausea or vomiting Anesthetic complications: no   No notable events documented.  Last Vitals:  Vitals:   07/22/23 1700 07/22/23 1701  BP: 109/66   Pulse: 70 69  Resp: 16 18  Temp: (!) 35.6 C (!) 35.7 C  SpO2: 100% 100%    Last Pain:  Vitals:   07/22/23 1541  TempSrc: Core  PainSc:                  Mark [name]

## 2023-07-22 NOTE — Anesthesia Preprocedure Evaluation (Addendum)
 Anesthesia Evaluation  Patient identified by MRN, date of birth, ID band Patient awake    Reviewed: Allergy & Precautions, H&P , NPO status , Patient's Chart, lab work & pertinent test results  History of Anesthesia Complications (+) PONV and history of anesthetic complications  Airway Mallampati: II  TM Distance: >3 FB Neck ROM: Full    Dental no notable dental hx.    Pulmonary neg pulmonary ROS   Pulmonary exam normal breath sounds clear to auscultation       Cardiovascular hypertension, + angina  Normal cardiovascular exam+ dysrhythmias Atrial Fibrillation  Rhythm:Regular Rate:Normal  TTE IMPRESSIONS     1. Left ventricular ejection fraction, by estimation, is 55 to 60%. Left  ventricular ejection fraction by 2D MOD biplane is 55.2 %. The left  ventricle has normal function. The left ventricle has no regional wall  motion abnormalities. Left ventricular  diastolic parameters were normal.   2. Peak RV-RA gradient 17 mmHg. Right ventricular systolic function is  normal. The right ventricular size is normal.   3. Left atrial size was mildly dilated.   4. The mitral valve is normal in structure. Mild mitral valve  regurgitation. No evidence of mitral stenosis.   5. The aortic valve is tricuspid. There is mild calcification of the  aortic valve. Aortic valve regurgitation is trivial. No aortic stenosis is  present.   6. IVC not visualized.      Neuro/Psych negative neurological ROS  negative psych ROS   GI/Hepatic Neg liver ROS, hiatal hernia, PUD,GERD  ,,  Endo/Other  Hypothyroidism    Renal/GU Renal InsufficiencyRenal disease   Malignant neoplasm of urinary bladder    Musculoskeletal negative musculoskeletal ROS (+)    Abdominal   Peds negative pediatric ROS (+)  Hematology negative hematology ROS (+)   Anesthesia Other Findings   Reproductive/Obstetrics negative OB ROS                               Anesthesia Physical Anesthesia Plan  ASA: 4  Anesthesia Plan: General   Post-op Pain Management:    Induction: Intravenous  PONV Risk Score and Plan: 3 and Treatment may vary due to age or medical condition  Airway Management Planned: Oral ETT  Additional Equipment: Arterial line, CVP, PA Cath, TEE, 3D TEE and Ultrasound Guidance Line Placement  Intra-op Plan:   Post-operative Plan: Extubation in OR  Informed Consent: I have reviewed the patients History and Physical, chart, labs and discussed the procedure including the risks, benefits and alternatives for the proposed anesthesia with the patient or authorized representative who has indicated his/her understanding and acceptance.     Dental advisory given  Plan Discussed with: CRNA  Anesthesia Plan Comments:          Anesthesia Quick Evaluation

## 2023-07-22 NOTE — Procedures (Signed)
 Extubation Procedure Note  Patient Details:   Name: Mark [name] DOB: April 16, 1950 MRN: [id number]   Airway Documentation:    Vent end date: 07/22/23 Vent end time: 1833   Evaluation  O2 sats: stable throughout Complications: No apparent complications Patient did tolerate procedure well. Bilateral Breath Sounds: Clear, Diminished   Yes Pt was successfully extubated with no apparent complications. NIF -21cmH2o obtained and on VC. Pt is currently on 3L [place] and is tolerating well. Pt is able to protect airway and able to speak at this time.   Lorina Roosevelt 07/22/2023, 6:34 PM

## 2023-07-22 NOTE — Interval H&P Note (Signed)
 History and Physical Interval Note:  07/22/2023 7:50 AM  Mark [name]  has presented today for surgery, with the diagnosis of CORONARY ARTERY DISEASE.  The various methods of treatment have been discussed with the patient and family. After consideration of risks, benefits and other options for treatment, the patient has consented to  Procedure(s): CORONARY ARTERY BYPASS GRAFTING (CABG) (N/A) ECHOCARDIOGRAM, TRANSESOPHAGEAL, INTRAOPERATIVE (N/A) as a surgical intervention.  The patient's history has been reviewed, patient examined, no change in status, stable for surgery.  I have reviewed the patient's chart and labs.  Questions were answered to the patient's satisfaction.     [name]

## 2023-07-22 NOTE — Hospital Course (Addendum)
 History of Present Illness:     This is a 73 year old male with a past medical history of remote, 2000) paroxysmal atrial fibrillation (not on anticoagulation), chronic kidney disease (stage IIIa), bladder cancer (status post urostomy), colon cancer, GERD, hypertension, hyperlipidemia, and pre diabetes who presented to the ED via EMS with complaints of chest pain with radiation to the jaw. He also stated he had shortness of breath and diaphoresis. He denied abdominal pain, nausea, or vomiting. EKG was apparently not suggestive of ischemia. Initial Troponin I (high sensitivity) was 18 and max 23. Chest x ray showed no active cardiopulmonary disease. CT coronary calcium  score done 07/17/2023 was 891 (for specifics please see study result in EPIC). He then underwent an echocardiogram 07/18/2023 that showed LVEF 55-60%, trivial AI, mild MR.  Cardiac catheterization done today showed distal LM to ostial LAD with a 65% stenosis and minimal luminal irregularities of the LAD and left Circumflex, and moderate, diffuse disease of the RCA. Also, urinalysis showed large leukocytes  and > 50 WBC and he is on Ceftriaxone  for UTI (he has a history of chronic UTIs). Urine culture is pending at this time. At the time of my examination, patient denied chest pain or shortness of breath. His son and daughter in law are at the bedside. He does take Oxy for chronic LE neuropathy. His son did mention after receiving Benadryl  earlier he seemed  a little out of it but this has since resolved.  Hospital Course: The patient was admitted to [facility] and cardiothoracic surgery was consulted. Dr. Luna Salinas reviewed the patient's diagnostic studies and determined he would benefit form surgical intervention. He reviewed the patient's treatment options as well as the risks and benefits of surgery with the patient. Mr. Burston was agreeable to proceed with surgery. The patient remained stable while inpatient and was brought to the  operating room on 07/22/23. He underwent CABG x 2 utilizing LIMA to LAD and SVG to OM. He tolerated the procedure well and was transferred to the SICU in stable condition. The patient was extubated the evening of surgery.  He required support with Levophed  which was weaned as hemodynamics allowed.  He developed Atrial Fibrillation and was treated with IV Amiodarone .  He converted to NSR and was transitioned to oral regimen.  His chest tubes were removed without difficulty on 6/8.  He had post operative thrombocytopenia and Lovenox  was not started. This improved with time. He was started on Midodrine  6/9 to help wean Levophed  to hypotension.  His pacing wires were removed without difficulty.  He converted to normal sinus rhythm and IV Amiodarone  was transitioned to PO Amiodarone . Home Metformin  was resumed for elevated blood glucose. He was felt stable for transfer to the progressive unit on POD4. He was started on Plavix  for history of unstable angina/possible NSTEMI. Regarding pain control, patient on Oxycodone  HCL prior to surgery. He has been receiving pain medication from a doctor on a regular basis prior to admission. He will be given a prescription for the Oxycodone  HCL to take while at SNF or CIR but he should not be given another prescription after this as he may obtain from the doctor who routinely prescribes the Oxy HCL. Chest tube sutures were removed on 06/13. He is felt stable for discharge today.

## 2023-07-22 NOTE — Brief Op Note (Signed)
 07/16/2023 - 07/22/2023  3:41 PM  PATIENT:  Chrystal Crape  73 y.o. male  PRE-OPERATIVE DIAGNOSIS:  CORONARY ARTERY DISEASE  POST-OPERATIVE DIAGNOSIS:  CORONARY ARTERY DISEASE  PROCEDURE:  Procedure(s): CORONARY ARTERY BYPASS GRAFTING X 2, USING LEFT INTERNAL MAMMARY ARTERY AND RIGHT ENDOSCOPIC HARVESTED GREATER SAPHENOUS VEIN (N/A) ECHOCARDIOGRAM, TRANSESOPHAGEAL, INTRAOPERATIVE (N/A) Vein harvest time: Vein prep time: -LIMA to LAD -SVG to OM  SURGEON:  Surgeons and Role:    * [name], MD - Primary  PHYSICIAN ASSISTANT: Debroah Fanning PA-C  ASSISTANTS: Amado Bacon RNFA   ANESTHESIA:   general  EBL:  715 mL   BLOOD ADMINISTERED:1 pheresis  PLTS  DRAINS: Mediastinal drains   LOCAL MEDICATIONS USED:  NONE  SPECIMEN:  No Specimen  DISPOSITION OF SPECIMEN:  N/A  COUNTS:  YES  TOURNIQUET:  * No tourniquets in log *  DICTATION: .Dragon Dictation  PLAN OF CARE: Admit to inpatient   PATIENT DISPOSITION:  ICU - intubated and hemodynamically stable.   Delay start of Pharmacological VTE agent (>24hrs) due to surgical blood loss or risk of bleeding: yes

## 2023-07-22 NOTE — Transfer of Care (Signed)
 Immediate Anesthesia Transfer of Care Note  Patient: Mark [name]  Procedure(s) Performed: CORONARY ARTERY BYPASS GRAFTING X 2, USING LEFT INTERNAL MAMMARY ARTERY AND RIGHT ENDOSCOPIC HARVESTED GREATER SAPHENOUS VEIN (Chest) ECHOCARDIOGRAM, TRANSESOPHAGEAL, INTRAOPERATIVE  Patient Location: SICU  Anesthesia Type:General  Level of Consciousness: Patient remains intubated per anesthesia plan  Airway & Oxygen Therapy: Patient remains intubated per anesthesia plan  Post-op Assessment: Report given to RN and Post -op Vital signs reviewed and stable  Post vital signs: Reviewed and stable  Last Vitals:  Vitals Value Taken Time  BP 120/76   Temp 35 C 07/22/23 1548  Pulse 80 07/22/23 1548  Resp 17 07/22/23 1548  SpO2 100 % 07/22/23 1548  Vitals shown include unfiled device data.  Last Pain:  Vitals:   07/22/23 0930  TempSrc: Oral  PainSc: 2       Patients Stated Pain Goal: 0 (07/21/23 2359)  Complications: No notable events documented.

## 2023-07-23 ENCOUNTER — Inpatient Hospital Stay (HOSPITAL_COMMUNITY)

## 2023-07-23 LAB — BPAM PLATELET PHERESIS
Blood Product Expiration Date: 202506072359
ISSUE DATE / TIME: 202506061458
Unit Type and Rh: 7300

## 2023-07-23 LAB — CBC
HCT: 27.2 % — ABNORMAL LOW (ref 39.0–52.0)
HCT: 27.5 % — ABNORMAL LOW (ref 39.0–52.0)
Hemoglobin: 9.2 g/dL — ABNORMAL LOW (ref 13.0–17.0)
Hemoglobin: 9.3 g/dL — ABNORMAL LOW (ref 13.0–17.0)
MCH: 29.1 pg (ref 26.0–34.0)
MCH: 29.6 pg (ref 26.0–34.0)
MCHC: 33.8 g/dL (ref 30.0–36.0)
MCHC: 33.8 g/dL (ref 30.0–36.0)
MCV: 86.1 fL (ref 80.0–100.0)
MCV: 87.6 fL (ref 80.0–100.0)
Platelets: 81 10*3/uL — ABNORMAL LOW (ref 150–400)
Platelets: 57 10*3/uL — ABNORMAL LOW (ref 150–400)
RBC: 3.16 MIL/uL — ABNORMAL LOW (ref 4.22–5.81)
RBC: 3.14 MIL/uL — ABNORMAL LOW (ref 4.22–5.81)
RDW: 14 % (ref 11.5–15.5)
RDW: 14.3 % (ref 11.5–15.5)
WBC: 13.9 10*3/uL — ABNORMAL HIGH (ref 4.0–10.5)
WBC: 12 10*3/uL — ABNORMAL HIGH (ref 4.0–10.5)
nRBC: 0 % (ref 0.0–0.2)
nRBC: 0 % (ref 0.0–0.2)

## 2023-07-23 LAB — PREPARE PLATELET PHERESIS: Unit division: 0

## 2023-07-23 LAB — GLUCOSE, CAPILLARY
Glucose-Capillary: 142 mg/dL — ABNORMAL HIGH (ref 70–99)
Glucose-Capillary: 122 mg/dL — ABNORMAL HIGH (ref 70–99)
Glucose-Capillary: 135 mg/dL — ABNORMAL HIGH (ref 70–99)
Glucose-Capillary: 136 mg/dL — ABNORMAL HIGH (ref 70–99)
Glucose-Capillary: 132 mg/dL — ABNORMAL HIGH (ref 70–99)
Glucose-Capillary: 119 mg/dL — ABNORMAL HIGH (ref 70–99)
Glucose-Capillary: 134 mg/dL — ABNORMAL HIGH (ref 70–99)
Glucose-Capillary: 177 mg/dL — ABNORMAL HIGH (ref 70–99)
Glucose-Capillary: 173 mg/dL — ABNORMAL HIGH (ref 70–99)
Glucose-Capillary: 136 mg/dL — ABNORMAL HIGH (ref 70–99)

## 2023-07-23 LAB — BASIC METABOLIC PANEL WITH GFR
Anion gap: 9 (ref 5–15)
Anion gap: 6 (ref 5–15)
BUN: 22 mg/dL (ref 8–23)
BUN: 22 mg/dL (ref 8–23)
CO2: 21 mmol/L — ABNORMAL LOW (ref 22–32)
CO2: 23 mmol/L (ref 22–32)
Calcium: 7.7 mg/dL — ABNORMAL LOW (ref 8.9–10.3)
Calcium: 8.2 mg/dL — ABNORMAL LOW (ref 8.9–10.3)
Chloride: 104 mmol/L (ref 98–111)
Chloride: 106 mmol/L (ref 98–111)
Creatinine, Ser: 1.29 mg/dL — ABNORMAL HIGH (ref 0.61–1.24)
Creatinine, Ser: 1.3 mg/dL — ABNORMAL HIGH (ref 0.61–1.24)
GFR, Estimated: 59 mL/min — ABNORMAL LOW (ref >60)
GFR, Estimated: 58 mL/min — ABNORMAL LOW (ref >60)
Glucose, Bld: 127 mg/dL — ABNORMAL HIGH (ref 70–99)
Glucose, Bld: 167 mg/dL — ABNORMAL HIGH (ref 70–99)
Potassium: 4.1 mmol/L (ref 3.5–5.1)
Potassium: 4 mmol/L (ref 3.5–5.1)
Sodium: 134 mmol/L — ABNORMAL LOW (ref 135–145)
Sodium: 135 mmol/L (ref 135–145)

## 2023-07-23 LAB — MAGNESIUM
Magnesium: 2.7 mg/dL — ABNORMAL HIGH (ref 1.7–2.4)
Magnesium: 2.5 mg/dL — ABNORMAL HIGH (ref 1.7–2.4)

## 2023-07-23 MED ORDER — METHOCARBAMOL 500 MG PO TABS
500.0000 mg | ORAL_TABLET | Freq: Three times a day (TID) | ORAL | Status: AC
  Administered 2023-07-23 – 2023-07-27 (×15): 500 mg via ORAL
  Filled 2023-07-23 (×15): qty 1

## 2023-07-23 MED ORDER — ENOXAPARIN SODIUM 30 MG/0.3ML IJ SOSY
30.0000 mg | PREFILLED_SYRINGE | Freq: Every day | INTRAMUSCULAR | Status: DC
  Administered 2023-07-23: 30 mg via SUBCUTANEOUS
  Filled 2023-07-23: qty 0.3

## 2023-07-23 MED ORDER — INSULIN ASPART 100 UNIT/ML IJ SOLN
0.0000 [IU] | INTRAMUSCULAR | Status: DC
  Administered 2023-07-23: 2 [IU] via SUBCUTANEOUS
  Administered 2023-07-23 (×2): 4 [IU] via SUBCUTANEOUS
  Administered 2023-07-23 – 2023-07-25 (×8): 2 [IU] via SUBCUTANEOUS

## 2023-07-23 NOTE — Progress Notes (Signed)
      [address]       [place] [address]             [phone]                 1 Day Post-Op Procedure(s) (LRB): CORONARY ARTERY BYPASS GRAFTING X 2, USING LEFT INTERNAL MAMMARY ARTERY AND RIGHT ENDOSCOPIC HARVESTED GREATER SAPHENOUS VEIN (N/A) ECHOCARDIOGRAM, TRANSESOPHAGEAL, INTRAOPERATIVE (N/A)   Events: No events _______________________________________________________________ Vitals: BP 105/88   Pulse 85   Temp 99.3 F (37.4 C)   Resp (!) 25   Ht 5\' 5"  (1.651 m)   Wt 77.2 kg   SpO2 98%   BMI 28.31 kg/m  Filed Weights   07/21/23 0422 07/22/23 0519 07/23/23 0610  Weight: 74.8 kg 74.4 kg 77.2 kg     - Neuro: alert NAD  - Cardiovascular: sinus  Drips: levo 1.   PAP: (8-29)/(1-15) 16/2 CVP:  [3 mmHg] 3 mmHg CO:  [3.9 L/min-6.2 L/min] 5.4 L/min CI:  [2.1 L/min/m2-3.42 L/min/m2] 2.99 L/min/m2  - Pulm: EWOB  ABG    Component Value Date/Time   PHART 7.338 (L) 07/22/2023 1926   PCO2ART 40.1 07/22/2023 1926   PO2ART 88 07/22/2023 1926   HCO3 21.6 07/22/2023 1926   TCO2 23 07/22/2023 1926   ACIDBASEDEF 4.0 (H) 07/22/2023 1926   O2SAT 96 07/22/2023 1926    - Abd: ND - Extremity: warm  .Intake/Output      06/06 0701 06/07 0700 06/07 0701 06/08 0700   P.O. 0    I.V. (mL/kg) 2876.9 (37.3)    Blood 749    IV Piggyback 2949.7    Total Intake(mL/kg) 6575.6 (85.2)    Urine (mL/kg/hr) 3445 (1.9)    Blood 715    Chest Tube 350    Total Output 4510    Net +2065.6            _______________________________________________________________ Labs:    Latest Ref Rng & Units 07/23/2023    5:11 AM 07/22/2023    9:06 PM 07/22/2023    7:26 PM  CBC  WBC 4.0 - 10.5 K/uL 13.9  14.0    Hemoglobin 13.0 - 17.0 g/dL 9.2  9.3  8.8   Hematocrit 39.0 - 52.0 % 27.2  27.5  26.0   Platelets 150 - 400 K/uL 81  88        Latest Ref Rng & Units 07/23/2023    5:11 AM 07/22/2023    9:06 PM 07/22/2023    7:26 PM  CMP  Glucose 70 - 99 mg/dL 696  295    BUN 8  - 23 mg/dL 22  27    Creatinine 2.84 - 1.24 mg/dL 1.32  4.40    Sodium 102 - 145 mmol/L 134  139  140   Potassium 3.5 - 5.1 mmol/L 4.1  4.5  4.4   Chloride 98 - 111 mmol/L 104  108    CO2 22 - 32 mmol/L 21  21    Calcium  8.9 - 10.3 mg/dL 7.7  8.4      CXR: PV congestion  _______________________________________________________________  Assessment and Plan: POD 1 s/p CABG  Neuro: adjusting pain meds CV: will remove swan and A-line.  Wean levo.  On A/S.  BB after off levo Pulm: IS/ambulaton Renal: creat trending down.  Lasix after off levo GI: on diet Heme: stable ID: [email or web address] Endo: SSI Dispo: continue ICU care   Hilarie Lovely 07/23/2023 8:53 AM

## 2023-07-23 NOTE — Op Note (Signed)
 NAME: Mark [name], Mark E. MEDICAL RECORD NO: [id number] ACCOUNT NO: [id number] DATE OF BIRTH: [birth year] FACILITY: [facility] LOCATION: [facility] PHYSICIAN: Milon Aloe. Luna Salinas, MD  Operative Report   DATE OF PROCEDURE: 07/22/2023   PREOPERATIVE DIAGNOSIS: Left main coronary artery disease.  POSTOPERATIVE DIAGNOSIS: Left main coronary artery disease.  PROCEDURE:  Median sternotomy, extracorporeal circulation, Coronary artery bypass grafting x2  Left internal mammary artery to the LAD,  Saphenous vein graft to obtuse marginal 1 Endoscopic vein harvest right thigh.  SURGEON: Milon Aloe. Luna Salinas, MD.  ASSISTANT: Debroah Fanning, PA.  Experienced assistance is necessary for this case [name] to surgical complexity. Debroah Fanning independently harvested the saphenous vein endoscopically and closed the leg incision. She then assisted with exposure, retraction of delicate tissue, suture management, and suctioning during the anastomoses.   ANESTHESIA: General.  FINDINGS: Transesophageal echocardiography showed ejection fraction of 55-60%, mild AI and trivial MR, unchanged post bypass.  Good quality targets, good quality conduits.  CLINICAL NOTE: Mark [name] is a 73 year old who presented with chest pain. Troponins were very minimally elevated. He underwent cardiac catheterization where he was found to have a 65% left main stenosis. There was plaque in the right coronary artery, but was not hemodynamically significant. He was advised to undergo coronary artery bypass grafting. The indications, risks, benefits, and alternatives were discussed in detail with the patient. He understood and accepted the risks and agreed to proceed.  OPERATIVE NOTE: Mark [name] was brought to the preoperative holding area on 07/22/2023. There, the anesthesia service under the direction of Dr. Elby Green placed a Swan-Ganz catheter and an arterial blood pressure monitoring line. He was taken to the operating room, anesthetized and  intubated. A Foley catheter was placed. Intravenous antibiotics were administered. Dr. Elby Green performed transesophageal echocardiography. Please see his separately dictated note for full details of that procedure. The patient had an ileostomy bag in place which was connected to a urinary drainage bag. The chest, abdomen, and legs were prepped and draped in the usual sterile fashion.  A timeout was performed. An incision was made in the medial aspect of the right leg at the level of the knee. The greater saphenous vein was harvested from the right thigh endoscopically. The saphenous vein was a good quality vessel. Simultaneously, a median sternotomy was performed and the left internal mammary artery was harvested under direct vision. The patient's chest wall was relatively stiff, but the mammary and saphenous vein were both excellent quality vessels. 2000 units of heparin  was administered during the vessel harvest. The remainder of the full heparin  dose was given prior to opening the pericardium.  After harvesting the conduits, the sternal retractor was placed and gradually opened. The pericardium was opened. The ascending aorta was inspected. There was no evidence of atherosclerotic disease. After confirming adequate anticoagulation with ACT measurement, the aorta was cannulated via concentric 2-0 Ethibond pledgetted pursestring sutures. A dual-stage venous cannula was placed via a pursestring suture in the right atrial appendage. Cardiopulmonary bypass was initiated. Flows were maintained per protocol. The patient was cooled to 34 degrees Celsius. The coronary arteries were inspected and anastomotic sites were chosen. The conduits were inspected and cut to length. A foam pad was placed in the pericardium to insulate the heart. Temperature probe was placed in the myocardial septum and a cardioplegia cannula was placed in the ascending aorta.  The aorta was cross-clamped. The left ventricle was emptied via the  aortic root vent, cardiac arrest then was achieved with a combination of cold antegrade blood cardioplegia  and topical ice saline. 1 liter of cardioplegia was administered. There was a rapid diastolic arrest that was subsequently to 9 degrees Celsius.  A reversed saphenous vein graft was placed end-to side to obtuse marginal 1. It was a 1.5-mm good quality target vessel. The vein was of good quality. It was anastomosed end-to-side with a running 7-0 Prolene suture. At the completion of the anastomosis, the probe passed easily proximally and distally. Cardioplegia was administered and there was good flow and good hemostasis.  Next, the left internal mammary artery was brought through the window in the pericardium. The distal end was beveled. It was anastomosed end-to-side to the distal LAD. The LAD and mammary were both 2-mm good quality vessels. An end-to-side anastomosis was performed with a running 8-0 Prolene suture. At the completion of the anastomosis, the bulldog clamp was briefly removed to inspect for hemostasis. Rapid septal rewarming was noted. The bulldog clamp was replaced and the mammary pedicle was tacked to the epicardial surface of the heart with 6-0 Prolene sutures.  The vein graft was cut to length. The cardioplegia cannula was removed from the ascending aorta. The proximal vein graft anastomosis was performed to a 4.5-mm punch aortotomy with a running 6-0 Prolene suture. At the completion of the anastomosis, the patient was placed in the Trendelenburg position. Lidocaine  was administered. The aortic root was de-aired and the aortic cross-clamp was removed. The cross-clamp time was 43 minutes. The patient required a single defibrillation with 10 joules and then was in sinus rhythm thereafter.  While rewarming was completed, all proximal and distal anastomoses were inspected for hemostasis. Epicardial pacing wires were placed on the right ventricle and right atrium. When the patient had  rewarmed to a core temperature of 37 degrees Celsius, he was weaned from cardiopulmonary bypass on the first attempt. He was paced and on no inotropic support at the time of separation from bypass. The total bypass time was 62 minutes. Initial cardiac index was greater than 2 L/min/m2. Post bypass transesophageal echocardiography was unchanged from the pre-bypass study.  A test dose of protamine  was administered and was well tolerated. The atrial and aortic cannulae were removed. The remainder of the protamine  was administered without incident. The chest was irrigated with warm saline. Hemostasis was achieved. The pericardium was closed over the ascending aorta and base of the heart with interrupted 3-0 silk suture. It came together easily without tension. The left pleural and mediastinal chest tubes were placed through separate subcostal incisions and secured with #1 silk sutures. The sternum was closed with a combination of single and double heavy-gauge stainless steel wires. The pectoralis fascia and subcutaneous tissue and skin were closed in standard fashion. Cardiac output after chest closure was 3.5  L/min with an index of 1.9 L/min/m2. All sponge, needle, and instrument counts were correct at the end of the procedure. The patient was transported from the operating room to the surgical intensive care unit intubated and in fair condition.    Amey.Baba D: 07/22/2023 3:39:31 pm T: 07/23/2023 2:02:00 am  JOB: [id number]/ [id number]

## 2023-07-24 ENCOUNTER — Inpatient Hospital Stay (HOSPITAL_COMMUNITY)

## 2023-07-24 LAB — CBC
HCT: 26.7 % — ABNORMAL LOW (ref 39.0–52.0)
Hemoglobin: 8.8 g/dL — ABNORMAL LOW (ref 13.0–17.0)
MCH: 29.1 pg (ref 26.0–34.0)
MCHC: 33 g/dL (ref 30.0–36.0)
MCV: 88.4 fL (ref 80.0–100.0)
Platelets: 59 10*3/uL — ABNORMAL LOW (ref 150–400)
RBC: 3.02 MIL/uL — ABNORMAL LOW (ref 4.22–5.81)
RDW: 14.5 % (ref 11.5–15.5)
WBC: 11.3 10*3/uL — ABNORMAL HIGH (ref 4.0–10.5)
nRBC: 0 % (ref 0.0–0.2)

## 2023-07-24 LAB — BASIC METABOLIC PANEL WITH GFR
Anion gap: 7 (ref 5–15)
BUN: 22 mg/dL (ref 8–23)
CO2: 24 mmol/L (ref 22–32)
Calcium: 8 mg/dL — ABNORMAL LOW (ref 8.9–10.3)
Chloride: 105 mmol/L (ref 98–111)
Creatinine, Ser: 1.28 mg/dL — ABNORMAL HIGH (ref 0.61–1.24)
GFR, Estimated: 59 mL/min — ABNORMAL LOW (ref >60)
Glucose, Bld: 145 mg/dL — ABNORMAL HIGH (ref 70–99)
Potassium: 3.9 mmol/L (ref 3.5–5.1)
Sodium: 136 mmol/L (ref 135–145)

## 2023-07-24 LAB — GLUCOSE, CAPILLARY
Glucose-Capillary: 129 mg/dL — ABNORMAL HIGH (ref 70–99)
Glucose-Capillary: 132 mg/dL — ABNORMAL HIGH (ref 70–99)
Glucose-Capillary: 142 mg/dL — ABNORMAL HIGH (ref 70–99)
Glucose-Capillary: 145 mg/dL — ABNORMAL HIGH (ref 70–99)
Glucose-Capillary: 139 mg/dL — ABNORMAL HIGH (ref 70–99)
Glucose-Capillary: 148 mg/dL — ABNORMAL HIGH (ref 70–99)

## 2023-07-24 MED ORDER — POTASSIUM CHLORIDE CRYS ER 20 MEQ PO TBCR
40.0000 meq | EXTENDED_RELEASE_TABLET | Freq: Once | ORAL | Status: AC
  Administered 2023-07-24: 40 meq via ORAL
  Filled 2023-07-24: qty 2

## 2023-07-24 MED ORDER — AMIODARONE IV BOLUS ONLY 150 MG/100ML: 150.0000 mg | Freq: Once | INTRAVENOUS | Status: DC

## 2023-07-24 MED ORDER — AMIODARONE HCL IN DEXTROSE 360-4.14 MG/200ML-% IV SOLN: 30.0000 mg/h | INTRAVENOUS | Status: DC

## 2023-07-24 MED ORDER — AMIODARONE LOAD VIA INFUSION
150.0000 mg | Freq: Once | INTRAVENOUS | Status: DC
  Filled 2023-07-24: qty 83.34

## 2023-07-24 MED ORDER — MAGNESIUM SULFATE 2 GM/50ML IV SOLN
2.0000 g | Freq: Once | INTRAVENOUS | Status: AC
  Administered 2023-07-24: 2 g via INTRAVENOUS
  Filled 2023-07-24: qty 50

## 2023-07-24 MED ORDER — AMIODARONE LOAD VIA INFUSION
150.0000 mg | Freq: Once | INTRAVENOUS | Status: AC
  Administered 2023-07-24: 150 mg via INTRAVENOUS
  Filled 2023-07-24: qty 83.34

## 2023-07-24 MED ORDER — AMIODARONE HCL IN DEXTROSE 360-4.14 MG/200ML-% IV SOLN
60.0000 mg/h | INTRAVENOUS | Status: AC
  Administered 2023-07-24 – 2023-07-25 (×4): 60 mg/h via INTRAVENOUS
  Filled 2023-07-24 (×4): qty 200

## 2023-07-24 MED ORDER — AMIODARONE HCL IN DEXTROSE 360-4.14 MG/200ML-% IV SOLN
60.0000 mg/h | INTRAVENOUS | Status: DC
  Administered 2023-07-24 (×2): 60 mg/h via INTRAVENOUS
  Filled 2023-07-24 (×2): qty 200

## 2023-07-24 NOTE — Progress Notes (Signed)
 EVENING ROUNDS NOTE :     [address]       [place] [address]             [phone]                 2 Days Post-Op Procedure(s) (LRB): CORONARY ARTERY BYPASS GRAFTING X 2, USING LEFT INTERNAL MAMMARY ARTERY AND RIGHT ENDOSCOPIC HARVESTED GREATER SAPHENOUS VEIN (N/A) ECHOCARDIOGRAM, TRANSESOPHAGEAL, INTRAOPERATIVE (N/A)   Total Length of Stay:  LOS: 5 days  Events:   Rate controlled Stable day    BP (!) 103/51   Pulse 70   Temp 97.7 F (36.5 C) (Oral)   Resp 19   Ht 5\' 5"  (1.651 m)   Wt 76.7 kg   SpO2 100%   BMI 28.12 kg/m          amiodarone 60 mg/hr (07/24/23 1800)   norepinephrine  (LEVOPHED ) Adult infusion 2 mcg/min (07/24/23 1800)    I/O last 3 completed shifts: In: 1416.5 [I.V.:966.6; IV Piggyback:449.9] Out: 2525 [Urine:1015; Other:750; Chest Tube:760]      Latest Ref Rng & Units 07/24/2023    4:18 AM 07/23/2023    4:05 PM 07/23/2023    5:11 AM  CBC  WBC 4.0 - 10.5 K/uL 11.3  12.0  13.9   Hemoglobin 13.0 - 17.0 g/dL 8.8  9.3  9.2   Hematocrit 39.0 - 52.0 % 26.7  27.5  27.2   Platelets 150 - 400 K/uL 59  57  81        Latest Ref Rng & Units 07/24/2023    4:18 AM 07/23/2023    4:05 PM 07/23/2023    5:11 AM  BMP  Glucose 70 - 99 mg/dL 696  295  284   BUN 8 - 23 mg/dL 22  22  22    Creatinine 0.61 - 1.24 mg/dL 1.32  4.40  1.02   Sodium 135 - 145 mmol/L 136  135  134   Potassium 3.5 - 5.1 mmol/L 3.9  4.0  4.1   Chloride 98 - 111 mmol/L 105  106  104   CO2 22 - 32 mmol/L 24  23  21    Calcium  8.9 - 10.3 mg/dL 8.0  8.2  7.7     ABG    Component Value Date/Time   PHART 7.338 (L) 07/22/2023 1926   PCO2ART 40.1 07/22/2023 1926   PO2ART 88 07/22/2023 1926   HCO3 21.6 07/22/2023 1926   TCO2 23 07/22/2023 1926   ACIDBASEDEF 4.0 (H) 07/22/2023 1926   O2SAT 96 07/22/2023 1926       Starleen Eastern, MD 07/24/2023 8:09 PM

## 2023-07-24 NOTE — Progress Notes (Addendum)
      [address]       [place] [address]             [phone]                 2 Days Post-Op Procedure(s) (LRB): CORONARY ARTERY BYPASS GRAFTING X 2, USING LEFT INTERNAL MAMMARY ARTERY AND RIGHT ENDOSCOPIC HARVESTED GREATER SAPHENOUS VEIN (N/A) ECHOCARDIOGRAM, TRANSESOPHAGEAL, INTRAOPERATIVE (N/A)   Events: Afib overnight _______________________________________________________________ Vitals: BP 108/87   Pulse (!) 158   Temp 98 F (36.7 C) (Oral)   Resp (!) 23   Ht 5\' 5"  (1.651 m)   Wt 76.7 kg   SpO2 (!) 83%   BMI 28.12 kg/m  Filed Weights   07/22/23 0519 07/23/23 0610 07/24/23 0630  Weight: 74.4 kg 77.2 kg 76.7 kg     - Neuro: alert NAD  - Cardiovascular: sinus  Drips: levo 1.  Amio 60    - Pulm: EWOB  ABG    Component Value Date/Time   PHART 7.338 (L) 07/22/2023 1926   PCO2ART 40.1 07/22/2023 1926   PO2ART 88 07/22/2023 1926   HCO3 21.6 07/22/2023 1926   TCO2 23 07/22/2023 1926   ACIDBASEDEF 4.0 (H) 07/22/2023 1926   O2SAT 96 07/22/2023 1926    - Abd: ND - Extremity: warm  .Intake/Output      06/07 0701 06/08 0700 06/08 0701 06/09 0700   P.O.     I.V. (mL/kg) 503.3 (6.6) 50.8 (0.7)   Blood     IV Piggyback 300    Total Intake(mL/kg) 803.2 (10.5) 50.8 (0.7)   Urine (mL/kg/hr) 840 (0.5) 75 (0.3)   Other 750    Blood     Chest Tube 390 20   Total Output 1980 95   Net -1176.8 -44.2           _______________________________________________________________ Labs:    Latest Ref Rng & Units 07/24/2023    4:18 AM 07/23/2023    4:05 PM 07/23/2023    5:11 AM  CBC  WBC 4.0 - 10.5 K/uL 11.3  12.0  13.9   Hemoglobin 13.0 - 17.0 g/dL 8.8  9.3  9.2   Hematocrit 39.0 - 52.0 % 26.7  27.5  27.2   Platelets 150 - 400 K/uL 59  57  81       Latest Ref Rng & Units 07/24/2023    4:18 AM 07/23/2023    4:05 PM 07/23/2023    5:11 AM  CMP  Glucose 70 - 99 mg/dL 130  865  784   BUN 8 - 23 mg/dL 22  22  22    Creatinine 0.61 - 1.24 mg/dL  6.96  2.95  2.84   Sodium 135 - 145 mmol/L 136  135  134   Potassium 3.5 - 5.1 mmol/L 3.9  4.0  4.1   Chloride 98 - 111 mmol/L 105  106  104   CO2 22 - 32 mmol/L 24  23  21    Calcium  8.9 - 10.3 mg/dL 8.0  8.2  7.7     CXR: PV congestion  _______________________________________________________________  Assessment and Plan: POD 2 s/p CABG  Neuro: pain controlled XL:[id number] amio bolus Pulm: IS/ambulaton.  Will remove chest tubes Renal: creat trending down.   GI: on diet Heme: thrombocytopenia.  Holding lovenox  ID: [email or web address] Endo: SSI Dispo: continue ICU care   Hilarie Lovely 07/24/2023 9:56 AM

## 2023-07-25 ENCOUNTER — Encounter (HOSPITAL_COMMUNITY): Payer: Self-pay | Admitting: Thoracic Surgery (Cardiothoracic Vascular Surgery)

## 2023-07-25 LAB — GLUCOSE, CAPILLARY
Glucose-Capillary: 137 mg/dL — ABNORMAL HIGH (ref 70–99)
Glucose-Capillary: 218 mg/dL — ABNORMAL HIGH (ref 70–99)
Glucose-Capillary: 151 mg/dL — ABNORMAL HIGH (ref 70–99)
Glucose-Capillary: 192 mg/dL — ABNORMAL HIGH (ref 70–99)
Glucose-Capillary: 156 mg/dL — ABNORMAL HIGH (ref 70–99)

## 2023-07-25 LAB — CBC
HCT: 26.4 % — ABNORMAL LOW (ref 39.0–52.0)
Hemoglobin: 8.7 g/dL — ABNORMAL LOW (ref 13.0–17.0)
MCH: 28.9 pg (ref 26.0–34.0)
MCHC: 33 g/dL (ref 30.0–36.0)
MCV: 87.7 fL (ref 80.0–100.0)
Platelets: 74 10*3/uL — ABNORMAL LOW (ref 150–400)
RBC: 3.01 MIL/uL — ABNORMAL LOW (ref 4.22–5.81)
RDW: 14.4 % (ref 11.5–15.5)
WBC: 12.2 10*3/uL — ABNORMAL HIGH (ref 4.0–10.5)
nRBC: 0 % (ref 0.0–0.2)

## 2023-07-25 LAB — MAGNESIUM: Magnesium: 3.2 mg/dL — ABNORMAL HIGH (ref 1.7–2.4)

## 2023-07-25 LAB — BASIC METABOLIC PANEL WITH GFR
Anion gap: 6 (ref 5–15)
BUN: 17 mg/dL (ref 8–23)
CO2: 24 mmol/L (ref 22–32)
Calcium: 8.1 mg/dL — ABNORMAL LOW (ref 8.9–10.3)
Chloride: 104 mmol/L (ref 98–111)
Creatinine, Ser: 1.23 mg/dL (ref 0.61–1.24)
GFR, Estimated: >60 mL/min (ref >60)
Glucose, Bld: 138 mg/dL — ABNORMAL HIGH (ref 70–99)
Potassium: 3.9 mmol/L (ref 3.5–5.1)
Sodium: 134 mmol/L — ABNORMAL LOW (ref 135–145)

## 2023-07-25 MED ORDER — METFORMIN HCL 500 MG PO TABS
500.0000 mg | ORAL_TABLET | Freq: Every day | ORAL | Status: DC
  Administered 2023-07-25 – 2023-08-03 (×9): 500 mg via ORAL
  Filled 2023-07-25 (×10): qty 1

## 2023-07-25 MED ORDER — MIDODRINE HCL 5 MG PO TABS
5.0000 mg | ORAL_TABLET | Freq: Three times a day (TID) | ORAL | Status: DC
  Administered 2023-07-25 (×3): 5 mg via ORAL
  Filled 2023-07-25 (×3): qty 1

## 2023-07-25 MED ORDER — AMIODARONE HCL 200 MG PO TABS
400.0000 mg | ORAL_TABLET | Freq: Two times a day (BID) | ORAL | Status: DC
  Administered 2023-07-25 – 2023-07-31 (×14): 400 mg via ORAL
  Filled 2023-07-25 (×14): qty 2

## 2023-07-25 MED ORDER — INSULIN ASPART 100 UNIT/ML IJ SOLN
0.0000 [IU] | Freq: Three times a day (TID) | INTRAMUSCULAR | Status: DC
  Administered 2023-07-25 (×2): 3 [IU] via SUBCUTANEOUS
  Administered 2023-07-26: 2 [IU] via SUBCUTANEOUS

## 2023-07-25 MED ORDER — MIDODRINE HCL 5 MG PO TABS
10.0000 mg | ORAL_TABLET | Freq: Three times a day (TID) | ORAL | Status: DC
  Administered 2023-07-26 – 2023-07-30 (×15): 10 mg via ORAL
  Filled 2023-07-25 (×15): qty 2

## 2023-07-25 MED FILL — Potassium Chloride Inj 2 mEq/ML: INTRAVENOUS | Qty: 40 | Status: AC

## 2023-07-25 MED FILL — Heparin Sodium (Porcine) Inj 1000 Unit/ML: Qty: 1000 | Status: AC

## 2023-07-25 MED FILL — Magnesium Sulfate Inj 50%: INTRAMUSCULAR | Qty: 10 | Status: AC

## 2023-07-25 NOTE — Progress Notes (Incomplete)
 Epicardial wires removed at 0820,

## 2023-07-25 NOTE — TOC Progression Note (Signed)
 Transition of Care [facility]) - Progression Note    Patient Details  Name: Mark [name] MRN: [id number] Date of Birth: September 18, 1950  [facility] CM/SW Contact  Graves-Bigelow, Jari Merles, RN Phone Number: 07/25/2023, 3:18 PM  Clinical Narrative:  Patient is POD-3 CABG. Case Manager spoke with staff RN regarding disposition needs-staff RN states daughter had concerns. Case Manager asked for PT/OT orders to be placed; will await recommendations. Case Manager will continue to follow for transition of care needs as the patient progresses.   Expected Discharge Plan: IP Rehab Facility Barriers to Discharge: Continued Medical Work up  Expected Discharge Plan and Services In-house Referral: NA Discharge Planning Services: CM Consult Post Acute Care Choice: IP Rehab Living arrangements for the past 2 months: Single Family Home                   DME Agency: NA       HH Arranged: NA           Social Determinants of Health (SDOH) Interventions SDOH Screenings   Food Insecurity: No Food Insecurity (07/18/2023)  Housing: Low Risk  (07/18/2023)  Transportation Needs: No Transportation Needs (07/18/2023)  Utilities: Not At Risk (07/18/2023)  Social Connections: Moderately Integrated (07/18/2023)  Tobacco Use: Low Risk  (07/22/2023)    Readmission Risk Interventions     No data to display

## 2023-07-25 NOTE — Progress Notes (Signed)
      [address]       [place] [address]             [phone]      Resting comfortably in bed  BP (!) 88/47   Pulse 61   Temp (!) 97.5 F (36.4 C) (Axillary)   Resp 19   Ht 5\' 5"  (1.651 m)   Wt 77.3 kg   SpO2 99%   BMI 28.37 kg/m  BP 112/65- has remained relatively low despite midodrine   Intake/Output Summary (Last 24 hours) at 07/25/2023 1647 Last data filed at 07/25/2023 1500 Gross per 24 hour  Intake 996.03 ml  Output 1800 ml  Net -803.97 ml   Will increase midodrine to 10 mg TID  Landon Pinion C. Luna Salinas, MD [facility] [phone]

## 2023-07-25 NOTE — Discharge Instructions (Signed)

## 2023-07-25 NOTE — Discharge Summary (Addendum)
 [address]       [place] [address]             [phone]    Physician Discharge Summary  Patient ID: Mark [name] MRN: [id number] DOB/AGE: 19-Dec-1950 73 y.o.  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   Deviated nasal septum 10/10/2013   Disorder of kidney and ureter 10/10/2013   ED (erectile dysfunction) of organic origin 10/10/2013   Hearing loss 10/10/2013   Sensorineural hearing loss 10/10/2013   Hematuria 10/10/2013   Hypercholesteremia 10/10/2013   Hypertrophy of nasal turbinates 10/10/2013   Hypothyroidism 10/10/2013   Intestinal infection due to other organism, not elsewhere classified 10/10/2013   Lower urinary tract infectious disease 10/10/2013   Malignant neoplasm of urinary bladder (HCC) 10/10/2013   Tinnitus 10/10/2013   OTHER DYSPHAGIA 11/14/2007   GERD 11/10/2007   ADENOCARCINOMA, COLON, HX OF 11/10/2007   History of colonic polyps 11/10/2007   Discharge Diagnoses:  Patient Active Problem List   Diagnosis Date Noted   Non-ST elevation (NSTEMI) myocardial infarction (HCC) 07/22/2023   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   Unstable angina (HCC) 07/18/2023   Chest pain 07/16/2023   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   Deviated nasal septum 10/10/2013   Disorder of kidney and ureter 10/10/2013   ED (erectile dysfunction) of organic origin 10/10/2013   Hearing loss 10/10/2013   Sensorineural hearing loss 10/10/2013   Hematuria 10/10/2013   Hypercholesteremia 10/10/2013   Hypertrophy of nasal turbinates 10/10/2013   Hypothyroidism 10/10/2013   Intestinal infection due to other organism, not elsewhere classified 10/10/2013   Lower urinary tract  infectious disease 10/10/2013   Malignant neoplasm of urinary bladder (HCC) 10/10/2013   Tinnitus 10/10/2013   OTHER DYSPHAGIA 11/14/2007   GERD 11/10/2007   ADENOCARCINOMA, COLON, HX OF 11/10/2007   History of colonic polyps 11/10/2007   Discharged Condition: stable  History of Present Illness:     This is a 73 year old male with a past medical history of remote, 2000) paroxysmal atrial fibrillation (not on anticoagulation), chronic kidney disease (stage IIIa), bladder cancer (status post urostomy), colon cancer, GERD, hypertension, hyperlipidemia, and pre diabetes who presented to the ED via EMS with complaints of chest pain with radiation to the jaw. He also stated he had shortness of breath and diaphoresis. He denied abdominal pain, nausea, or vomiting. EKG was apparently not suggestive of ischemia. Initial Troponin I (high sensitivity) was 18 and max 23. Chest x ray showed no active cardiopulmonary disease. CT coronary calcium  score done 07/17/2023 was 891 (for specifics please see study result in EPIC). He then underwent an echocardiogram 07/18/2023 that showed LVEF 55-60%, trivial AI, mild MR.  Cardiac catheterization done today showed distal LM to ostial LAD with a 65% stenosis and minimal luminal irregularities of the LAD and left Circumflex, and moderate, diffuse disease of the RCA. Also, urinalysis showed large leukocytes  and > 50 WBC and he is on Ceftriaxone  for UTI (he has a history of chronic UTIs). Urine culture is pending at this time. At the time of my examination, patient denied chest pain or shortness of breath. His son and daughter in law are at the bedside. 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 His son did mention after receiving Benadryl  earlier he seemed  a little out of it but this has since resolved.  Hospital Course: The patient was admitted to [facility] and cardiothoracic surgery was consulted. Dr. Kerrin reviewed the patient's diagnostic studies and  determined he would benefit form surgical intervention. He reviewed the patient's treatment options as well as the risks and benefits of surgery with the patient. [name] was agreeable to proceed with surgery. The patient remained stable while inpatient and was brought to the operating room on 07/22/23. He underwent CABG x 2 utilizing LIMA to LAD and SVG to OM. He tolerated the procedure well and was transferred to the SICU in stable condition. The patient was extubated the evening of surgery.  He required support with Levophed  which was weaned as hemodynamics allowed.  He developed Atrial Fibrillation and was treated with IV Amiodarone .  He converted to NSR and was transitioned to oral regimen.  His chest tubes were removed without difficulty on 6/8.  He had post operative thrombocytopenia and Lovenox  was not started. This improved with time. He was started on Midodrine  6/9 to help wean Levophed  to hypotension.  His pacing wires were removed without difficulty.  He converted to normal sinus rhythm and IV Amiodarone  was transitioned to PO Amiodarone . Home Metformin  was resumed for elevated blood glucose. He was felt stable for transfer to the progressive unit on POD4. He was started on Plavix  for history of unstable angina/possible NSTEMI. Regarding pain control, patient on Oxycodone  HCL prior to surgery. He has been receiving pain medication from a doctor on a regular basis prior to admission. He will be given a prescription for the Oxycodone  HCL to take while at SNF or CIR but he should not be given another prescription after this as he may obtain from the doctor who routinely prescribes the Oxy HCL. Chest tube sutures were removed on 06/13.  The patient's blood pressure improved and his Midodrine  was decreased to 5 mg TID.  His insurance denied authorization for CIR.  Due to this SNF placement was arranged, and the patient was agreeable to this.  He developed dysuria which is not uncommon for him due to  chronic UTI.  He requested his home Macrodantin  be resumed.  He continues to maintain NSR.  His surgical incisions are healing without evidence of infection.  He is ambulating well on room air. Insurance denied CIR placement so plan was for SNF at discharge but insurance denied SNF as well. Plan was for discharge to son's home with home PT/OT.  He is felt stable for discharge today.   Consults: None  Significant Diagnostic Studies: angiography:     Dist LM to Ost LAD lesion is 65% stenosed.   1.  65% distal left main lesion with mild diffuse disease of the LAD and left circumflex. 2.  Moderate diffuse disease of the right coronary artery; a pressure wire assessment was performed to assess the hemodynamic significance of this burden of coronary atherosclerosis.  The RFR was 0.96 with a pressure wire position in the right posterolateral ventricular branch consistent with hemodynamically insignificant disease. 3.  LVEDP of 14 mmHg   Recommendation: Cardiothoracic surgical evaluation.  Results were reviewed with the patient's brother Mark [name] via phone.   Treatments: surgery:   NAME: Devoto, Jance E. MEDICAL RECORD NO: [id number] ACCOUNT NO: [id number] DATE OF BIRTH: [birth year] FACILITY: [facility] LOCATION: [facility] PHYSICIAN: Elspeth BROCKS. Kerrin, MD   Operative Report    DATE OF PROCEDURE: 07/22/2023  PREOPERATIVE DIAGNOSIS: Left main coronary artery disease.   POSTOPERATIVE DIAGNOSIS: Left main coronary artery disease.   PROCEDURE: Median sternotomy, extracorporeal circulation, coronary artery bypass grafting x2 (left internal mammary artery to the LAD, saphenous vein graft to obtuse marginal 1), endoscopic vein harvest right thigh.   SURGEON: Elspeth BROCKS. Kerrin, MD.   ASSISTANT: Con Standard, PA.   Discharge Exam: Blood pressure (!) 88/59, pulse 81, temperature 97.8 F (36.6 C), temperature source Oral, resp. rate 18, height 5' 5 (1.651 m), weight 73 kg, SpO2  95%. Cardiovascular: RRR Pulmonary: Clear to auscultation bilaterally Abdomen: Soft, non tender, bowel sounds present. Extremities: No lower extremity edema. Wounds: Clean and dry.  No erythema or signs of infection.   Discharge Medications:  The patient has been discharged on:   1.Beta Blocker:  Yes [   ]                              No   [  x ]                              If No, reason: Labile BP, on midodrine   2.Ace Inhibitor/ARB: Yes [   ]                                     No  [ X   ]                                     If No, reason: Labile BP;on Midodrine  for hypotension  3.Statin:   Yes [ X  ]                  No  [   ]                  If No, reason:  4.Ecasa:  Yes  [  X ]                  No   [   ]                  If No, reason:  Patient had ACS upon admission: Yes  Plavix /P2Y12 inhibitor: Yes [  x ]                                      No  [   ]     Discharge Instructions     AMB Referral to [facility]   Complete by: As directed    Post-ACS, urgent   Reason For Referral: Lipids   Amb Referral to Cardiac Rehabilitation   Complete by: As directed    Diagnosis: CABG   CABG X ___: 2   After initial evaluation and assessments completed: Virtual Based Care may be provided alone or in conjunction with Phase 2 Cardiac Rehab based on patient barriers.: Yes   Intensive Cardiac Rehabilitation (ICR) [facility] location only OR Traditional Cardiac Rehabilitation (TCR) *If criteria for ICR are not met will enroll in TCR ([facility] only): Yes      Allergies as of 08/03/2023  Reactions   Penicillins Hives, Other (See Comments)   rash   Atorvastatin    Other reaction(s): muscle aches   Contrast Media  [iodinated Contrast Media]         Medication List     TAKE these medications    acetaminophen  325 MG tablet Commonly known as: TYLENOL  Take 2 tablets (650 mg total) by mouth every 6 (six) hours as needed for mild pain (pain score 1-3) or moderate pain  (pain score 4-6).   amiodarone  200 MG tablet Commonly known as: PACERONE  Take 1 tablet two times daily for 5 days then take 1 tablet daily thereafter   aspirin  EC 81 MG tablet Take 81 mg by mouth in the morning. Swallow whole. What changed: Another medication with the same name was removed. Continue taking this medication, and follow the directions you see here.   bisacodyl  5 MG EC tablet Commonly known as: DULCOLAX Take 5 mg by mouth daily as needed.   clopidogrel  75 MG tablet Commonly known as: PLAVIX  Take 1 tablet (75 mg total) by mouth daily.   DULoxetine  30 MG capsule Commonly known as: CYMBALTA  Take 30 mg by mouth daily.   ezetimibe  10 MG tablet Commonly known as: ZETIA  Take 1 tablet (10 mg total) by mouth daily.   ferrous sulfate 324 MG Tbec Take 324 mg by mouth daily.   MAGNESIUM  PO Take 1 tablet by mouth daily.   metFORMIN  500 MG tablet Commonly known as: GLUCOPHAGE  Take 500 mg by mouth as needed.   midodrine  5 MG tablet Commonly known as: PROAMATINE  Take 1 tablet (5 mg total) by mouth 3 (three) times daily with meals.   multivitamins ther. w/minerals Tabs tablet Take 1 tablet by mouth daily.   nitrofurantoin  100 MG capsule Commonly known as: MACRODANTIN  Take 100 mg by mouth daily.   Oxycodone  HCl 10 MG Tabs Take 1 tablet (10 mg total) by mouth every 6 (six) hours as needed.   rosuvastatin  40 MG tablet Commonly known as: CRESTOR  Take 1 tablet (40 mg total) by mouth daily.   temazepam  30 MG capsule Commonly known as: RESTORIL  Take 30 mg by mouth at bedtime. For sleep.   VITAMIN B12 PO Take 1 tablet by mouth daily.   VITAMIN C PO Take 1 tablet by mouth daily.               Durable Medical Equipment  (From admission, onward)           Start     Ordered   08/02/23 1408  For home use only DME 4 wheeled rolling walker with seat  Once       Question:  Patient needs a walker to treat with the following condition  Answer:  Physical  deconditioning   08/02/23 1408              Follow-up Information     Kerrin Elspeth BROCKS, MD Follow up on 08/30/2023.   Specialty: Cardiothoracic Surgery Why: Follow up appointment is at 9:45AM, please get a chest xray at 8:45AM on the second floor of our building Contact information: [address] [place] [address] [phone]         Rana Lum CROME, NP Follow up on 08/09/2023.   Specialty: Cardiology Why: Cardiology appointment is at 2:45PM Contact information: [address] [place] [address] [phone]         [facility] Follow up.   Why: Rolling walker with seat- to be delivered to the room.  Contact information: [address] [place] [address] [phone]         [facility] Follow up.   Specialty: Home Health Services Why: Physical and Occupational Therapy-office to call with visit times. Contact information: [address] [place] [address] [phone]                 Signed:  Con GORMAN Bend, PA-C  08/03/2023, 8:21 AM

## 2023-07-25 NOTE — Progress Notes (Signed)
 Paged attending MD d/t pts BP dropping to 91/48 (62) mmHg at 15:15 after norepinephrine  was recently turned off.   Orders are to page MD if MAP falls below .   Advised to not resume norepinephrine  and continue monitoring.

## 2023-07-25 NOTE — Discharge Summary (Incomplete Revision)
 [address]       [place] [address]             [phone]    Physician Discharge Summary  Patient ID: Mark [name] MRN: [id number] DOB/AGE: Aug 02, 1950 73 y.o.  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   Deviated nasal septum 10/10/2013   Disorder of kidney and ureter 10/10/2013   ED (erectile dysfunction) of organic origin 10/10/2013   Hearing loss 10/10/2013   Sensorineural hearing loss 10/10/2013   Hematuria 10/10/2013   Hypercholesteremia 10/10/2013   Hypertrophy of nasal turbinates 10/10/2013   Hypothyroidism 10/10/2013   Intestinal infection due to other organism, not elsewhere classified 10/10/2013   Lower urinary tract infectious disease 10/10/2013   Malignant neoplasm of urinary bladder (HCC) 10/10/2013   Tinnitus 10/10/2013   OTHER DYSPHAGIA 11/14/2007   GERD 11/10/2007   ADENOCARCINOMA, COLON, HX OF 11/10/2007   History of colonic polyps 11/10/2007   Discharge Diagnoses:  Patient Active Problem List   Diagnosis Date Noted   Non-ST elevation (NSTEMI) myocardial infarction (HCC) 07/22/2023   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   Unstable angina (HCC) 07/18/2023   Chest pain 07/16/2023   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   Deviated nasal septum 10/10/2013   Disorder of kidney and ureter 10/10/2013   ED (erectile dysfunction) of organic origin 10/10/2013   Hearing loss 10/10/2013   Sensorineural hearing loss 10/10/2013   Hematuria 10/10/2013   Hypercholesteremia 10/10/2013   Hypertrophy of nasal turbinates 10/10/2013   Hypothyroidism 10/10/2013   Intestinal infection due to other organism, not elsewhere classified 10/10/2013   Lower urinary tract  infectious disease 10/10/2013   Malignant neoplasm of urinary bladder (HCC) 10/10/2013   Tinnitus 10/10/2013   OTHER DYSPHAGIA 11/14/2007   GERD 11/10/2007   ADENOCARCINOMA, COLON, HX OF 11/10/2007   History of colonic polyps 11/10/2007   Discharged Condition: stable  History of Present Illness:     This is a 73 year old male with a past medical history of remote, 2000) paroxysmal atrial fibrillation (not on anticoagulation), chronic kidney disease (stage IIIa), bladder cancer (status post urostomy), colon cancer, GERD, hypertension, hyperlipidemia, and pre diabetes who presented to the ED via EMS with complaints of chest pain with radiation to the jaw. He also stated he had shortness of breath and diaphoresis. He denied abdominal pain, nausea, or vomiting. EKG was apparently not suggestive of ischemia. Initial Troponin I (high sensitivity) was 18 and max 23. Chest x ray showed no active cardiopulmonary disease. CT coronary calcium  score done 07/17/2023 was 891 (for specifics please see study result in EPIC). He then underwent an echocardiogram 07/18/2023 that showed LVEF 55-60%, trivial AI, mild MR.  Cardiac catheterization done today showed distal LM to ostial LAD with a 65% stenosis and minimal luminal irregularities of the LAD and left Circumflex, and moderate, diffuse disease of the RCA. Also, urinalysis showed large leukocytes  and > 50 WBC and he is on Ceftriaxone  for UTI (he has a history of chronic UTIs). Urine culture is pending at this time. At the time of my examination, patient denied chest pain or shortness of breath. His son and daughter in law are at the bedside. 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 His son did mention after receiving Benadryl  earlier he seemed  a little out of it but this has since resolved.  Hospital Course: The patient was admitted to [facility] and cardiothoracic surgery was consulted. Dr. Luna Salinas reviewed the patient's diagnostic studies and  determined he would benefit form surgical intervention. He reviewed the patient's treatment options as well as the risks and benefits of surgery with the patient. [name] was agreeable to proceed with surgery. The patient remained stable while inpatient and was brought to the operating room on 07/22/23. He underwent CABG x 2 utilizing LIMA to LAD and SVG to OM. He tolerated the procedure well and was transferred to the SICU in stable condition. The patient was extubated the evening of surgery.  He required support with Levophed  which was weaned as hemodynamics allowed.  He developed Atrial Fibrillation and was treated with IV Amiodarone .  He converted to NSR and was transitioned to oral regimen.  His chest tubes were removed without difficulty on 6/8.  He had post operative thrombocytopenia and Lovenox  was not started. This improved with time. He was started on Midodrine  6/9 to help wean Levophed  to hypotension.  His pacing wires were removed without difficulty.  He converted to normal sinus rhythm and IV Amiodarone  was transitioned to PO Amiodarone . Home Metformin  was resumed for elevated blood glucose. He was felt stable for transfer to the progressive unit on POD4. He was started on Plavix  for history of unstable angina/possible NSTEMI. Regarding pain control, patient on Oxycodone  HCL prior to surgery. He has been receiving pain medication from a doctor on a regular basis prior to admission. He will be given a prescription for the Oxycodone  HCL to take while at SNF or CIR but he should not be given another prescription after this as he may obtain from the doctor who routinely prescribes the Oxy HCL. Chest tube sutures were removed on 06/13.  The patient's blood pressure improved and his Midodrine  was decreased to 5 mg TID.  His insurance denied authorization for CIR.  Due to this SNF placement was arranged, and the patient was agreeable to this.  He developed dysuria which is not uncommon for him due to  chronic UTI.  He requested his home Macrodantin be resumed.  He continues to maintain NSR.  His surgical incisions are healing without evidence of infection.  He is felt stable for discharge today.   Consults: None  Significant Diagnostic Studies: angiography:     Dist LM to Ost LAD lesion is 65% stenosed.   1.  65% distal left main lesion with mild diffuse disease of the LAD and left circumflex. 2.  Moderate diffuse disease of the right coronary artery; a pressure wire assessment was performed to assess the hemodynamic significance of this burden of coronary atherosclerosis.  The RFR was 0.96 with a pressure wire position in the right posterolateral ventricular branch consistent with hemodynamically insignificant disease. 3.  LVEDP of 14 mmHg   Recommendation: Cardiothoracic surgical evaluation.  Results were reviewed with the patient's brother Tim via phone.   Treatments: surgery:   NAME: Aronoff, Antwaine E. MEDICAL RECORD NO: [id number] ACCOUNT NO: [id number] DATE OF BIRTH: 1951-01-01 FACILITY: [facility] LOCATION: [facility] PHYSICIAN: Milon Aloe. Luna Salinas, MD   Operative Report    DATE OF PROCEDURE: 07/22/2023     PREOPERATIVE DIAGNOSIS: Left main coronary artery disease.   POSTOPERATIVE DIAGNOSIS: Left main coronary artery disease.   PROCEDURE: Median sternotomy, extracorporeal circulation, coronary artery bypass grafting x2 (left internal mammary  artery to the LAD, saphenous vein graft to obtuse marginal 1), endoscopic vein harvest right thigh.   SURGEON: Milon Aloe. Luna Salinas, MD.   ASSISTANT: Valaria Garland, PA.   Discharge Exam: Blood pressure 100/65, pulse 74, temperature 98.5 F (36.9 C), temperature source Oral, resp. rate 15, height 5' 5 (1.651 m), weight 73.1 kg, SpO2 96%. Cardiovascular: RRR Pulmonary: Clear to auscultation bilaterally Abdomen: Soft, non tender, bowel sounds present. Extremities: No lower extremity edema. Wounds: Clean and dry.  No erythema or signs  of infection.   Discharge Medications:  The patient has been discharged on:   1.Beta Blocker:  Yes [   ]                              No   [  x ]                              If No, reason: Labile BP  2.Ace Inhibitor/ARB: Yes [   ]                                     No  [ X   ]                                     If No, reason: Labile BP;on Midodrine  for hypotension  3.Statin:   Yes [ X  ]                  No  [   ]                  If No, reason:  4.Ecasa:  Yes  [  X ]                  No   [   ]                  If No, reason:  Patient had ACS upon admission: Yes  Plavix /P2Y12 inhibitor: Yes [  x ]                                      No  [   ]     Discharge Instructions     AMB Referral to [facility]   Complete by: As directed    Post-ACS, urgent   Reason For Referral: Lipids   Amb Referral to Cardiac Rehabilitation   Complete by: As directed    Diagnosis: CABG   CABG X ___: 2   After initial evaluation and assessments completed: Virtual Based Care may be provided alone or in conjunction with Phase 2 Cardiac Rehab based on patient barriers.: Yes   Intensive Cardiac Rehabilitation (ICR) [facility] location only OR Traditional Cardiac Rehabilitation (TCR) *If criteria for ICR are not met will enroll in TCR [facility] only): Yes      Allergies as of 08/01/2023       Reactions   Penicillins Hives, Other (See Comments)   rash   Atorvastatin    Other reaction(s): muscle aches   Contrast Media  [iodinated Contrast Media]  Medication List     TAKE these medications    acetaminophen  325 MG tablet Commonly known as: TYLENOL  Take 2 tablets (650 mg total) by mouth every 6 (six) hours as needed for mild pain (pain score 1-3) or moderate pain (pain score 4-6).   amiodarone  200 MG tablet Commonly known as: PACERONE  Take 1 tablet twice per day for 7 days then take 1 tablet daily thereafter   aspirin  EC 81 MG tablet Take 81 mg by mouth in the morning.  Swallow whole. What changed: Another medication with the same name was removed. Continue taking this medication, and follow the directions you see here.   bisacodyl  5 MG EC tablet Commonly known as: DULCOLAX Take 5 mg by mouth daily as needed.   clopidogrel  75 MG tablet Commonly known as: PLAVIX  Take 1 tablet (75 mg total) by mouth daily.   DULoxetine  30 MG capsule Commonly known as: CYMBALTA  Take 30 mg by mouth daily.   ezetimibe  10 MG tablet Commonly known as: ZETIA  Take 1 tablet (10 mg total) by mouth daily.   ferrous sulfate 324 MG Tbec Take 324 mg by mouth daily.   MAGNESIUM  PO Take 1 tablet by mouth daily.   metFORMIN  500 MG tablet Commonly known as: GLUCOPHAGE  Take 500 mg by mouth as needed.   midodrine  5 MG tablet Commonly known as: PROAMATINE  Take 1 tablet (5 mg total) by mouth 3 (three) times daily with meals.   multivitamins ther. w/minerals Tabs tablet Take 1 tablet by mouth daily.   nitrofurantoin 100 MG capsule Commonly known as: MACRODANTIN Take 100 mg by mouth daily.   Oxycodone  HCl 10 MG Tabs Take 1 tablet (10 mg total) by mouth every 6 (six) hours as needed.   rosuvastatin  40 MG tablet Commonly known as: CRESTOR  Take 1 tablet (40 mg total) by mouth daily.   temazepam  30 MG capsule Commonly known as: RESTORIL  Take 30 mg by mouth at bedtime. For sleep.   VITAMIN B12 PO Take 1 tablet by mouth daily.   VITAMIN C PO Take 1 tablet by mouth daily.          Follow-up Information     [name], MD Follow up on 08/30/2023.   Specialty: Cardiothoracic Surgery Why: Follow up appointment is at 9:45AM, please get a chest xray at 8:45AM on the second floor of our building Contact information: [address] [place] [address] [phone]         Ava Boatman, NP Follow up on 08/09/2023.   Specialty: Cardiology Why: Cardiology appointment is at 2:45PM Contact information: [address] [place]  [address] [phone]                 Signed:  Angela Barban, PA-C  08/01/2023, 8:12 AM

## 2023-07-25 NOTE — Progress Notes (Signed)
 3 Days Post-Op Procedure(s) (LRB): CORONARY ARTERY BYPASS GRAFTING X 2, USING LEFT INTERNAL MAMMARY ARTERY AND RIGHT ENDOSCOPIC HARVESTED GREATER SAPHENOUS VEIN (N/A) ECHOCARDIOGRAM, TRANSESOPHAGEAL, INTRAOPERATIVE (N/A) Subjective: No complaints this AM' Pain well controlled, denies nausea  Objective: Vital signs in last 24 hours: Temp:  [97.6 F (36.4 C)-98.1 F (36.7 C)] 97.8 F (36.6 C) (06/09 0317) Pulse Rate:  [63-138] 66 (06/09 0705) Cardiac Rhythm: Normal sinus rhythm (06/09 0000) Resp:  [13-27] 16 (06/09 0705) BP: (74-131)/(37-95) 85/55 (06/09 0700) SpO2:  [69 %-100 %] 100 % (06/09 0705) Weight:  [77.3 kg] 77.3 kg (06/09 0500)  Hemodynamic parameters for last 24 hours:    Intake/Output from previous day: 06/08 0701 - 06/09 0700 In: 1211.3 [I.V.:1061.3; IV Piggyback:150] Out: 1045 [Urine:675; Chest Tube:370] Intake/Output this shift: No intake/output data recorded.  General appearance: alert, cooperative, and no distress Neurologic: intact Heart: regular rate and rhythm Lungs: diminished breath sounds bibasilar  Lab Results: Recent Labs    07/24/23 0418 07/25/23 0330  WBC 11.3* 12.2*  HGB 8.8* 8.7*  HCT 26.7* 26.4*  PLT 59* 74*   BMET:  Recent Labs    07/24/23 0418 07/25/23 0330  NA 136 134*  K 3.9 3.9  CL 105 104  CO2 24 24  GLUCOSE 145* 138*  BUN 22 17  CREATININE 1.28* 1.23  CALCIUM  8.0* 8.1*    PT/INR:  Recent Labs    07/22/23 1541  LABPROT 21.2*  INR 1.8*   ABG    Component Value Date/Time   PHART 7.338 (L) 07/22/2023 1926   HCO3 21.6 07/22/2023 1926   TCO2 23 07/22/2023 1926   ACIDBASEDEF 4.0 (H) 07/22/2023 1926   O2SAT 96 07/22/2023 1926   CBG (last 3)  Recent Labs    07/24/23 1913 07/24/23 2302 07/25/23 0317  GLUCAP 139* 148* 137*    Assessment/Plan: S/P Procedure(s) (LRB): CORONARY ARTERY BYPASS GRAFTING X 2, USING LEFT INTERNAL MAMMARY ARTERY AND RIGHT ENDOSCOPIC HARVESTED GREATER SAPHENOUS VEIN  (N/A) ECHOCARDIOGRAM, TRANSESOPHAGEAL, INTRAOPERATIVE (N/A) POD # 3 NEURO- intact CV- in Sr on IV amiodarone  Will convert amiodarone to PO  Still on 1 mcg/min norepi- start midodrine  Beta blocker, Zetia , ASA  Dc pacing wires RESP- IS RENAL- creatinine normal, lytes Ok ENDO- CBG mildly elevated  Resume metformin  Change SSI to AC and HS GI- tolerating diet, appetite fair Anemia- Hgb 8.7, stable Thrombocytopenia - PLT up to 74K- monitor SCD + ambulation for DVT prophylaxis Possible txf to 4E later if BP ok    LOS: 6 days    Mark [name] 07/25/2023

## 2023-07-26 ENCOUNTER — Inpatient Hospital Stay (HOSPITAL_COMMUNITY)

## 2023-07-26 LAB — CBC
HCT: 27.7 % — ABNORMAL LOW (ref 39.0–52.0)
Hemoglobin: 8.9 g/dL — ABNORMAL LOW (ref 13.0–17.0)
MCH: 28.5 pg (ref 26.0–34.0)
MCHC: 32.1 g/dL (ref 30.0–36.0)
MCV: 88.8 fL (ref 80.0–100.0)
Platelets: 90 10*3/uL — ABNORMAL LOW (ref 150–400)
RBC: 3.12 MIL/uL — ABNORMAL LOW (ref 4.22–5.81)
RDW: 14.8 % (ref 11.5–15.5)
WBC: 10.5 10*3/uL (ref 4.0–10.5)
nRBC: 0 % (ref 0.0–0.2)

## 2023-07-26 LAB — BASIC METABOLIC PANEL WITH GFR
Anion gap: 6 (ref 5–15)
BUN: 23 mg/dL (ref 8–23)
CO2: 23 mmol/L (ref 22–32)
Calcium: 8.2 mg/dL — ABNORMAL LOW (ref 8.9–10.3)
Chloride: 108 mmol/L (ref 98–111)
Creatinine, Ser: 1.32 mg/dL — ABNORMAL HIGH (ref 0.61–1.24)
GFR, Estimated: 57 mL/min — ABNORMAL LOW (ref >60)
Glucose, Bld: 101 mg/dL — ABNORMAL HIGH (ref 70–99)
Potassium: 4.3 mmol/L (ref 3.5–5.1)
Sodium: 137 mmol/L (ref 135–145)

## 2023-07-26 LAB — GLUCOSE, CAPILLARY
Glucose-Capillary: 98 mg/dL (ref 70–99)
Glucose-Capillary: 104 mg/dL — ABNORMAL HIGH (ref 70–99)
Glucose-Capillary: 141 mg/dL — ABNORMAL HIGH (ref 70–99)
Glucose-Capillary: 110 mg/dL — ABNORMAL HIGH (ref 70–99)

## 2023-07-26 LAB — MAGNESIUM: Magnesium: 2.1 mg/dL (ref 1.7–2.4)

## 2023-07-26 MED ORDER — SODIUM CHLORIDE 0.9% FLUSH: 3.0000 mL | INTRAVENOUS | Status: DC | PRN

## 2023-07-26 MED ORDER — SODIUM CHLORIDE 0.9 % IV SOLN: 250.0000 mL | INTRAVENOUS | Status: AC | PRN

## 2023-07-26 MED ORDER — SODIUM CHLORIDE 0.9% FLUSH
3.0000 mL | Freq: Two times a day (BID) | INTRAVENOUS | Status: DC
  Administered 2023-07-26 – 2023-08-03 (×17): 3 mL via INTRAVENOUS

## 2023-07-26 MED ORDER — MAGNESIUM HYDROXIDE 400 MG/5ML PO SUSP: 30.0000 mL | Freq: Every day | ORAL | Status: DC | PRN

## 2023-07-26 MED ORDER — ~~LOC~~ CARDIAC SURGERY, PATIENT & FAMILY EDUCATION
Freq: Once | Status: AC
  Administered 2023-07-26: 1

## 2023-07-26 NOTE — Progress Notes (Signed)
 EVENING ROUNDS NOTE :     [address]       [place] [address]             [phone]                 4 Days Post-Op Procedure(s) (LRB): CORONARY ARTERY BYPASS GRAFTING X 2, USING LEFT INTERNAL MAMMARY ARTERY AND RIGHT ENDOSCOPIC HARVESTED GREATER SAPHENOUS VEIN (N/A) ECHOCARDIOGRAM, TRANSESOPHAGEAL, INTRAOPERATIVE (N/A)   Total Length of Stay:  LOS: 7 days  Events:   No events Stable day Awaiting floor    BP (!) 108/55   Pulse 64   Temp 98.1 F (36.7 C) (Oral)   Resp 17   Ht 5\' 5"  (1.651 m)   Wt 76.3 kg   SpO2 97%   BMI 27.99 kg/m          sodium chloride       I/O last 3 completed shifts: In: 914.4 [I.V.:914.4] Out: 2750 [Urine:2650; Emesis/NG output:100]      Latest Ref Rng & Units 07/26/2023    4:06 AM 07/25/2023    3:30 AM 07/24/2023    4:18 AM  CBC  WBC 4.0 - 10.5 K/uL 10.5  12.2  11.3   Hemoglobin 13.0 - 17.0 g/dL 8.9  8.7  8.8   Hematocrit 39.0 - 52.0 % 27.7  26.4  26.7   Platelets 150 - 400 K/uL 90  74  59        Latest Ref Rng & Units 07/26/2023    4:06 AM 07/25/2023    3:30 AM 07/24/2023    4:18 AM  BMP  Glucose 70 - 99 mg/dL 914  782  956   BUN 8 - 23 mg/dL 23  17  22    Creatinine 0.61 - 1.24 mg/dL 2.13  0.86  5.78   Sodium 135 - 145 mmol/L 137  134  136   Potassium 3.5 - 5.1 mmol/L 4.3  3.9  3.9   Chloride 98 - 111 mmol/L 108  104  105   CO2 22 - 32 mmol/L 23  24  24    Calcium  8.9 - 10.3 mg/dL 8.2  8.1  8.0     ABG    Component Value Date/Time   PHART 7.338 (L) 07/22/2023 1926   PCO2ART 40.1 07/22/2023 1926   PO2ART 88 07/22/2023 1926   HCO3 21.6 07/22/2023 1926   TCO2 23 07/22/2023 1926   ACIDBASEDEF 4.0 (H) 07/22/2023 1926   O2SAT 96 07/22/2023 1926       Starleen Eastern, MD 07/26/2023 5:47 PM

## 2023-07-26 NOTE — Evaluation (Signed)
 Physical Therapy Evaluation Patient Details Name: Mark [name] MRN: [id number] DOB: 23-Mar-1950 Today's Date: 07/26/2023  History of Present Illness  73 yo presented 07/16/23 due to chest pain. EKG and troponins not indicative of ischemia. CTA chest indicated moderate 3 vessel disease; 6/3 L heart cath; 6/6 CABGx2; post-op vasoplegia with hypotension  Clinical Impression  Patient is s/p above surgery resulting in functional limitations due to the deficits listed below (see PT Problem List). PTA patient lives alone in one level home with 2 steps to enter. His son lives in town, however works more than a ful-time job. His daughter lives out of state. He reports he can ask numerous friends to assist him with driving to appointments and have groceries delivered. Anticipate pt could reach modified independent status with intensive inpatient therapies >3 hrs/day. Patient will benefit from acute skilled PT to increase their independence and safety with mobility to facilitate discharge.         If plan is discharge home, recommend the following: Assistance with cooking/housework;Assist for transportation;Help with stairs or ramp for entrance   Can travel by private vehicle        Equipment Recommendations Other (comment) (TBD; has cane and may be enough)  Recommendations for Other Services  Rehab consult    Functional Status Assessment Patient has had a recent decline in their functional status and demonstrates the ability to make significant improvements in function in a reasonable and predictable amount of time.     Precautions / Restrictions Precautions Precautions: Sternal;Fall Precaution Booklet Issued: No Recall of Precautions/Restrictions: Impaired Precaution/Restrictions Comments: questioning cues to adhere      Mobility  Bed Mobility Overal bed mobility: Needs Assistance Bed Mobility: Rolling, Sidelying to Sit Rolling: Contact guard assist Sidelying to sit: Min assist, HOB  elevated       General bed mobility comments: cues for sequencing; pt independently reached for pillow to hold to his chest    Transfers Overall transfer level: Needs assistance Equipment used: None Transfers: Sit to/from Stand Sit to Stand: Contact guard assist           General transfer comment: from EOB x 2 reps without imbalance    Ambulation/Gait Ambulation/Gait assistance: Min assist Gait Distance (Feet): 470 Feet Assistive device: None Gait Pattern/deviations: Step-through pattern, Wide base of support, Decreased stride length   Gait velocity interpretation: <1.8 ft/sec, indicate of risk for recurrent falls   General Gait Details: slight incr lateral sway/imbalance multiple times  Stairs            Wheelchair Mobility     Tilt Bed    Modified Rankin (Stroke Patients Only)       Balance Overall balance assessment: Needs assistance Sitting-balance support: No upper extremity supported, Feet supported Sitting balance-Leahy Scale: Good     Standing balance support: No upper extremity supported Standing balance-Leahy Scale: Fair                               Pertinent Vitals/Pain Pain Assessment Pain Assessment: No/denies pain    Home Living Family/patient expects to be discharged to:: Private residence Living Arrangements: Alone Available Help at Discharge: Family;Friend(s);Available PRN/intermittently Type of Home: Mobile home Home Access: Stairs to enter Entrance Stairs-Rails: Left Entrance Stairs-Number of Steps: 2   Home Layout: One level Home Equipment: Cane - single point      Prior Function Prior Level of Function : Independent/Modified Independent;Driving (retired Education officer, environmental, [employer])  Mobility Comments: 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       Extremity/Trunk Assessment   Upper Extremity Assessment Upper Extremity Assessment: Right hand dominant;Defer to OT  evaluation    Lower Extremity Assessment Lower Extremity Assessment: RLE deficits/detail;LLE deficits/detail RLE Sensation: history of peripheral neuropathy LLE Sensation: history of peripheral neuropathy    Cervical / Trunk Assessment Cervical / Trunk Assessment: Other exceptions Cervical / Trunk Exceptions: sternotomy  Communication   Communication Communication: No apparent difficulties    Cognition Arousal: Alert Behavior During Therapy: WFL for tasks assessed/performed   PT - Cognitive impairments: Memory, Safety/Judgement                       PT - Cognition Comments: initially stated he lived in an apartment and daughter had to correct him; per daughter, was driving even when his feet were numb and having difficulty feeling the pedals Following commands: Intact       Cueing Cueing Techniques: Verbal cues     General Comments General comments (skin integrity, edema, etc.): Daughter present and very concerned re: pt's willingness to ask friends for help on discharge. Concerned pt will not have enough support to be safe at home alone    Exercises     Assessment/Plan    PT Assessment Patient needs continued PT services  PT Problem List Decreased activity tolerance;Decreased balance;Decreased mobility;Decreased knowledge of use of DME;Decreased safety awareness;Decreased knowledge of precautions;Impaired sensation       PT Treatment Interventions DME instruction;Gait training;Stair training;Functional mobility training;Therapeutic activities;Therapeutic exercise;Balance training;Cognitive remediation;Patient/family education    PT Goals (Current goals can be found in the Care Plan section)  Acute Rehab PT Goals Patient Stated Goal: be able to care for himself PT Goal Formulation: With patient Time For Goal Achievement: 08/09/23 Potential to Achieve Goals: Good    Frequency Min 3X/week     Co-evaluation PT/OT/SLP Co-Evaluation/Treatment: Yes Reason  for Co-Treatment: For patient/therapist safety;To address functional/ADL transfers PT goals addressed during session: Mobility/safety with mobility;Balance         AM-PAC PT "6 Clicks" Mobility  Outcome Measure Help needed turning from your back to your side while in a flat bed without using bedrails?: A Little Help needed moving from lying on your back to sitting on the side of a flat bed without using bedrails?: A Little Help needed moving to and from a bed to a chair (including a wheelchair)?: A Little Help needed standing up from a chair using your arms (e.g., wheelchair or bedside chair)?: A Little Help needed to walk in hospital room?: A Little Help needed climbing 3-5 steps with a railing? : A Lot 6 Click Score: 17    End of Session   Activity Tolerance: Patient tolerated treatment well Patient left: in chair;with call bell/phone within reach;with family/visitor present Nurse Communication: Mobility status PT Visit Diagnosis: Unsteadiness on feet (R26.81);Difficulty in walking, not elsewhere classified (R26.2)    Time: 0981-1914 PT Time Calculation (min) (ACUTE ONLY): 32 min   Charges:   PT Evaluation $PT Eval Low Complexity: 1 Low   PT General Charges $$ ACUTE PT VISIT: 1 Visit          Gayle Kava, PT Acute Rehabilitation Services  Office [phone]   Guilford Leep 07/26/2023, 3:37 PM

## 2023-07-26 NOTE — Progress Notes (Signed)
 4 Days Post-Op Procedure(s) (LRB): CORONARY ARTERY BYPASS GRAFTING X 2, USING LEFT INTERNAL MAMMARY ARTERY AND RIGHT ENDOSCOPIC HARVESTED GREATER SAPHENOUS VEIN (N/A) ECHOCARDIOGRAM, TRANSESOPHAGEAL, INTRAOPERATIVE (N/A) Subjective: No complaints this morning  Objective: Vital signs in last 24 hours: Temp:  [97.5 F (36.4 C)-98 F (36.7 C)] 97.6 F (36.4 C) (06/10 0700) Pulse Rate:  [58-72] 63 (06/10 0700) Cardiac Rhythm: Normal sinus rhythm (06/10 0000) Resp:  [4-29] 18 (06/10 0700) BP: (56-139)/(42-88) 99/52 (06/10 0700) SpO2:  [91 %-100 %] 97 % (06/10 0700) Weight:  [76.3 kg] 76.3 kg (06/10 0500)  Hemodynamic parameters for last 24 hours:    Intake/Output from previous day: 06/09 0701 - 06/10 0700 In: 316.4 [I.V.:316.4] Out: 2250 [Urine:2150; Emesis/NG output:100] Intake/Output this shift: No intake/output data recorded.  General appearance: alert, cooperative, and no distress Neurologic: intact Heart: regular rate and rhythm Lungs: diminished breath sounds left base Abdomen: normal findings: soft, non-tender  Lab Results: Recent Labs    07/25/23 0330 07/26/23 0406  WBC 12.2* 10.5  HGB 8.7* 8.9*  HCT 26.4* 27.7*  PLT 74* 90*   BMET:  Recent Labs    07/25/23 0330 07/26/23 0406  NA 134* 137  K 3.9 4.3  CL 104 108  CO2 24 23  GLUCOSE 138* 101*  BUN 17 23  CREATININE 1.23 1.32*  CALCIUM  8.1* 8.2*    PT/INR: No results for input(s): "LABPROT", "INR" in the last 72 hours. ABG    Component Value Date/Time   PHART 7.338 (L) 07/22/2023 1926   HCO3 21.6 07/22/2023 1926   TCO2 23 07/22/2023 1926   ACIDBASEDEF 4.0 (H) 07/22/2023 1926   O2SAT 96 07/22/2023 1926   CBG (last 3)  Recent Labs    07/25/23 1614 07/25/23 2107 07/26/23 0615  GLUCAP 192* 156* 98    Assessment/Plan: S/P Procedure(s) (LRB): CORONARY ARTERY BYPASS GRAFTING X 2, USING LEFT INTERNAL MAMMARY ARTERY AND RIGHT ENDOSCOPIC HARVESTED GREATER SAPHENOUS VEIN (N/A) ECHOCARDIOGRAM,  TRANSESOPHAGEAL, INTRAOPERATIVE (N/A) Plan for transfer to step-down: see transfer orders POD # 4 looks good NEURO- intact CV- in Sr on amiodarone   Vasoplegia- BP better on midodrine RESP- small left effusion RENAL- creatinine up slightly- monitor  Lytes ok ENDO - CBG elevated yesterday afternoon  On metformin + SSI  Monitor GI- tolerating diet Thrombocytopenia- improving PT/OT  LOS: 7 days    [name] 07/26/2023

## 2023-07-26 NOTE — Evaluation (Signed)
 Occupational Therapy Evaluation Patient Details Name: Mark [name] MRN: [id number] DOB: 1951/01/27 Today's Date: 07/26/2023   History of Present Illness   73 yo presented 07/16/23 due to chest pain. EKG and troponins not indicative of ischemia. CTA chest indicated moderate 3 vessel disease; 6/3 L heart cath; 6/6 CABGx2; post-op vasoplegia with hypotension     Clinical Impressions PTA pt lived at home alone independently. Pt states he likes to exercise however he has been limited over the past year due to fatigue. Pt currently requires min A with mobility and Mod A with ADL tasks due to below listed deficits. Feel pt can reach modified independent level with mobility and ADL tasks with intensive inpatient follow-up therapy, >3 hours/day. Daughter present and concerned about DC home without post acute rehab. VSS during session. Acute OT to follow.      If plan is discharge home, recommend the following:   A little help with walking and/or transfers;A little help with bathing/dressing/bathroom;Assistance with cooking/housework;Direct supervision/assist for medications management;Assist for transportation;Help with stairs or ramp for entrance     Functional Status Assessment   Patient has had a recent decline in their functional status and demonstrates the ability to make significant improvements in function in a reasonable and predictable amount of time.     Equipment Recommendations   BSC/3in1     Recommendations for Other Services   Rehab consult     Precautions/Restrictions   Precautions Precautions: Sternal;Fall Precaution Booklet Issued: No Recall of Precautions/Restrictions: Impaired Precaution/Restrictions Comments: questioning cues to adhere Restrictions Other Position/Activity Restrictions: sternal     Mobility Bed Mobility Overal bed mobility: Needs Assistance Bed Mobility: Rolling, Sidelying to Sit Rolling: Contact guard assist Sidelying to sit: Min  assist, HOB elevated       General bed mobility comments: cues for sequencing; pt independently reached for pillow to hold to his chest    Transfers Overall transfer level: Needs assistance Equipment used: None Transfers: Sit to/from Stand Sit to Stand: Contact guard assist           General transfer comment: from EOB x 2 reps without imbalance; good use of sternal precuaitons/not pushing up with arms      Balance Overall balance assessment: Needs assistance Sitting-balance support: No upper extremity supported, Feet supported Sitting balance-Leahy Scale: Good     Standing balance support: No upper extremity supported Standing balance-Leahy Scale: Fair    During mobility pt required assistance at times due to imbalance; most likely will do bette if wearing shoes                         ADL either performed or assessed with clinical judgement   ADL Overall ADL's : Needs assistance/impaired     Grooming: Set up;Supervision/safety;Sitting   Upper Body Bathing: Moderate assistance;Minimal assistance;Maximal assistance;Sitting   Lower Body Bathing: Moderate assistance;Sit to/from stand   Upper Body Dressing : Moderate assistance;Sitting   Lower Body Dressing: Moderate assistance;Sit to/from stand   Toilet Transfer: Minimal assistance;Ambulation   Toileting- Clothing Manipulation and Hygiene: Minimal assistance; pt has a urostomy       Functional mobility during ADLs: Minimal assistance;+2 for safety/equipment General ADL Comments: began education on sternotomy precautions     Vision Baseline Vision/History: 0 No visual deficits;1 Wears glasses Vision Assessment?: Wears glasses for reading     Perception         Praxis         Pertinent Vitals/Pain Pain Assessment  Pain Assessment: No/denies pain     Extremity/Trunk Assessment Upper Extremity Assessment Upper Extremity Assessment: Right hand dominant   Lower Extremity Assessment Lower  Extremity Assessment: Defer to PT evaluation RLE Sensation: history of peripheral neuropathy LLE Sensation: history of peripheral neuropathy   Cervical / Trunk Assessment Cervical / Trunk Assessment: Other exceptions Cervical / Trunk Exceptions: sternotomy   Communication Communication Communication: No apparent difficulties   Cognition Arousal: Alert Behavior During Therapy: WFL for tasks assessed/performed Cognition: No apparent impairments (decreased insight into amount of assistance needed after DC given restrictions)                               Following commands: Intact       Cueing  General Comments   Cueing Techniques: Verbal cues  Daughter present and very concerned re: pt's willingness to ask friends for help on discharge. Concerned pt will not have enough support to be safe at home alone   Exercises     Shoulder Instructions      Home Living Family/patient expects to be discharged to:: Private residence Living Arrangements: Alone Available Help at Discharge: Family;Friend(s);Available PRN/intermittently Type of Home: Mobile home Home Access: Stairs to enter Entrance Stairs-Number of Steps: 2 Entrance Stairs-Rails: Left Home Layout: One level     Bathroom Shower/Tub: IT trainer: Standard Bathroom Accessibility: Yes How Accessible: Accessible via walker Home Equipment: Cane - single point          Prior Functioning/Environment Prior Level of Function : Independent/Modified Independent;Driving (retired Education officer, environmental, [employer] fast [employer]; daughter concerned about pt's driving PTA)             Mobility Comments: 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      OT Problem List: Decreased strength;Decreased activity tolerance;Impaired balance (sitting and/or standing);Decreased safety awareness;Decreased knowledge of use of DME or AE;Cardiopulmonary status limiting activity;Decreased knowledge of  precautions   OT Treatment/Interventions: Self-care/ADL training;Therapeutic exercise;Energy conservation;DME and/or AE instruction;Therapeutic activities;Patient/family education;Balance training      OT Goals(Current goals can be found in the care plan section)   Acute Rehab OT Goals Patient Stated Goal: get better and go home OT Goal Formulation: With patient Time For Goal Achievement: 08/09/23 Potential to Achieve Goals: Good   OT Frequency:  Min 2X/week    Co-evaluation PT/OT/SLP Co-Evaluation/Treatment: Yes Reason for Co-Treatment: For patient/therapist safety;To address functional/ADL transfers   OT goals addressed during session: ADL's and self-care      AM-PAC OT "6 Clicks" Daily Activity     Outcome Measure Help from another person eating meals?: None Help from another person taking care of personal grooming?: A Little Help from another person toileting, which includes using toliet, bedpan, or urinal?: A Lot Help from another person bathing (including washing, rinsing, drying)?: A Lot Help from another person to put on and taking off regular upper body clothing?: A Lot Help from another person to put on and taking off regular lower body clothing?: A Lot 6 Click Score: 15   End of Session Equipment Utilized During Treatment: Gait belt;Rolling walker (2 wheels) Nurse Communication: Mobility status  Activity Tolerance: Patient tolerated treatment well Patient left: in chair;with call bell/phone within reach;with family/visitor present  OT Visit Diagnosis: Unsteadiness on feet (R26.81);Other abnormalities of gait and mobility (R26.89);Muscle weakness (generalized) (M62.81)                Time: 1610-9604 OT Time Calculation (min): 27  min Charges:  OT General Charges $OT Visit: 1 Visit OT Evaluation $OT Eval Moderate Complexity: 1 Mod  Jacque Garrels, OT/L   Acute OT Clinical Specialist Acute Rehabilitation Services Pager [phone] Office [phone]    [facility] 07/26/2023, 6:07 PM

## 2023-07-26 NOTE — Progress Notes (Signed)
 Pt arrived from [facility] in wheelchair.  Tele applied and CCMD notified.  WIll report to night RN

## 2023-07-27 DIAGNOSIS — R7303 Prediabetes: Secondary | ICD-10-CM

## 2023-07-27 DIAGNOSIS — N189 Chronic kidney disease, unspecified: Secondary | ICD-10-CM | POA: Diagnosis not present

## 2023-07-27 DIAGNOSIS — R5381 Other malaise: Secondary | ICD-10-CM | POA: Diagnosis not present

## 2023-07-27 DIAGNOSIS — Z951 Presence of aortocoronary bypass graft: Secondary | ICD-10-CM

## 2023-07-27 DIAGNOSIS — R079 Chest pain, unspecified: Secondary | ICD-10-CM | POA: Diagnosis not present

## 2023-07-27 DIAGNOSIS — T451X5A Adverse effect of antineoplastic and immunosuppressive drugs, initial encounter: Secondary | ICD-10-CM

## 2023-07-27 DIAGNOSIS — I25110 Atherosclerotic heart disease of native coronary artery with unstable angina pectoris: Secondary | ICD-10-CM

## 2023-07-27 DIAGNOSIS — G620 Drug-induced polyneuropathy: Secondary | ICD-10-CM

## 2023-07-27 LAB — CBC
HCT: 28.8 % — ABNORMAL LOW (ref 39.0–52.0)
Hemoglobin: 9.2 g/dL — ABNORMAL LOW (ref 13.0–17.0)
MCH: 28.3 pg (ref 26.0–34.0)
MCHC: 31.9 g/dL (ref 30.0–36.0)
MCV: 88.6 fL (ref 80.0–100.0)
Platelets: 120 10*3/uL — ABNORMAL LOW (ref 150–400)
RBC: 3.25 MIL/uL — ABNORMAL LOW (ref 4.22–5.81)
RDW: 15.1 % (ref 11.5–15.5)
WBC: 9.5 10*3/uL (ref 4.0–10.5)
nRBC: 0 % (ref 0.0–0.2)

## 2023-07-27 LAB — GLUCOSE, CAPILLARY
Glucose-Capillary: 84 mg/dL (ref 70–99)
Glucose-Capillary: 96 mg/dL (ref 70–99)
Glucose-Capillary: 138 mg/dL — ABNORMAL HIGH (ref 70–99)

## 2023-07-27 LAB — BASIC METABOLIC PANEL WITH GFR
Anion gap: 9 (ref 5–15)
BUN: 25 mg/dL — ABNORMAL HIGH (ref 8–23)
CO2: 20 mmol/L — ABNORMAL LOW (ref 22–32)
Calcium: 8.4 mg/dL — ABNORMAL LOW (ref 8.9–10.3)
Chloride: 107 mmol/L (ref 98–111)
Creatinine, Ser: 1.33 mg/dL — ABNORMAL HIGH (ref 0.61–1.24)
GFR, Estimated: 56 mL/min — ABNORMAL LOW (ref >60)
Glucose, Bld: 86 mg/dL (ref 70–99)
Potassium: 4.2 mmol/L (ref 3.5–5.1)
Sodium: 136 mmol/L (ref 135–145)

## 2023-07-27 LAB — MAGNESIUM: Magnesium: 2 mg/dL (ref 1.7–2.4)

## 2023-07-27 MED ORDER — CLOPIDOGREL BISULFATE 75 MG PO TABS
75.0000 mg | ORAL_TABLET | Freq: Every day | ORAL | Status: DC
  Administered 2023-07-27 – 2023-08-03 (×8): 75 mg via ORAL
  Filled 2023-07-27 (×8): qty 1

## 2023-07-27 MED ORDER — ASPIRIN 81 MG PO TBEC
81.0000 mg | DELAYED_RELEASE_TABLET | Freq: Every day | ORAL | Status: DC
  Administered 2023-07-28 – 2023-08-03 (×7): 81 mg via ORAL
  Filled 2023-07-27 (×7): qty 1

## 2023-07-27 NOTE — Progress Notes (Addendum)
  Progress Note  Patient Name: Mark [name] Date of Encounter: 07/27/2023 [facility] Cardiologist: Peter Swazil[place], MD   Interval Summary    Feels well this morning, has been up walking the hallway  Vital Signs Vitals:   07/26/23 2041 07/26/23 2311 07/27/23 0333 07/27/23 0759  BP: (!) 107/58 (!) 99/58 124/73 (!) 111/51  Pulse: 64 64 75 67  Resp: 20 17 19 18   Temp: 98 F (36.7 C) 98.7 F (37.1 C) 98.4 F (36.9 C) 98.3 F (36.8 C)  TempSrc: Oral Oral Oral Oral  SpO2: 99% 95% 100% 96%  Weight:   74.8 kg   Height:        Intake/Output Summary (Last 24 hours) at 07/27/2023 1033 Last data filed at 07/27/2023 0335 Gross per 24 hour  Intake 480 ml  Output 1100 ml  Net -620 ml      07/27/2023    3:33 AM 07/26/2023    5:00 AM 07/25/2023    5:00 AM  Last 3 Weights  Weight (lbs) 164 lb 14.5 oz 168 lb 3.4 oz 170 lb 8 oz  Weight (kg) 74.8 kg 76.3 kg 77.338 kg      Telemetry/ECG   Sinus Rhythm - Personally Reviewed  Physical Exam  GEN: No acute distress.   Neck: No JVD Cardiac: RRR, no murmurs, rubs, or gallops.  Respiratory: Clear to auscultation bilaterally. GI: Soft, nontender, non-distended  MS: No edema  Assessment & Plan   73 yo male with PMH of of atrial fibrillation not on anticoagulation, bladder cancer post urostomy, CKD, colon cancer in remission, GERD, hypertension, hyperlipidemia who presented with unstable angina and found to have multivessel CAD  CAD s/p 2v CABG  -- post op day 5, progressing well. Now on DAPT with ASA 81mg /plavix. Vasoplegia post op, being managed with midodrine 10mg  TID. Unable to add additional therapy at this time (home antihypertensives on hold)  DM -- Hgb A1c 6.1 on admission, has been resumed on metformin -- can consider SGLT2 prior to discharge  HLD -- HDL 21, LDL 85, trig 259 -- on Crestor  40mg  daily, Zetia   Paroxsymal atrial fibrillation -- maintaining sinus rhythm -- has not been on OAC PTA (reports isolated  episode in the past without recurrence therefore was stopped)   For questions or updates, please contact [facility] Please consult [email or web address] for contact info under       Signed, Johnie Nailer, NP   ATTENDING ATTESTATION:  After conducting a review of all available clinical information with the care team, interviewing the patient, and performing a physical exam, I agree with the findings and plan described in this note.   GEN: No acute distress.   HEENT:  MMM, no JVD, no scleral icterus Cardiac: RRR, no murmurs, rubs, or gallops.  Stable incision Respiratory: Clear to auscultation bilaterally. GI: Soft, nontender, non-distended  MS: No edema; No deformity. Neuro:  Nonfocal   Patient doing well after MV CABG.  No acute events.  Discussed with patient regarding SGLT2i and he has chronic issues with UTIs so will defer.  Otherwise continue medical therapy.    Alyssa Backbone, MD Pager [phone]

## 2023-07-27 NOTE — Consult Note (Addendum)
 Physical Medicine and Rehabilitation Consult Reason for Consult:Rehab Referring Physician: Dr. Luna Salinas   HPI: Mark [name] is a 73 y.o. male with past medical history of A-fib, CKD, bladder cancer, colon cancer in remission, hypertension, prediabetes, hyperlipidemia who presented with chest pain.  Patient had echo with EF 55 to 60%.  Patient had cardiovascular evaluation and was found to have multivessel CAD.  On 07/22/2023 he had CABG x 2 by Dr. Milderd Alken for left main coronary artery disease.  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 He walks with a cane.  Patient was seen by PT and OT and found to have deficits min assist/mod assist level and felt to be a candidate for acute inpatient reevaluation.  Per chart review patient lives alone in a 1 level home with 2 steps to enter.  Son may be able assist intermittently.    Home: Home Living Family/patient expects to be discharged to:: Private residence Living Arrangements: Alone Available Help at Discharge: Family, Friend(s), Available PRN/intermittently Type of Home: Mobile home Home Access: Stairs to enter Secretary/administrator of Steps: 2 Entrance Stairs-Rails: Left Home Layout: One level Bathroom Shower/Tub: Tub/shower unit, Engineer, building services: Standard Bathroom Accessibility: Yes Home Equipment: Cane - single point  Functional History: Prior Function Prior Level of Function : Independent/Modified Independent, Driving (retired Education officer, environmental, Civil Service fast streamer; daughter concerned about pt's driving PTA) Mobility Comments: 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 Functional Status:  Mobility: Bed Mobility Overal bed mobility: Needs Assistance Bed Mobility: Rolling, Sidelying to Sit, Sit to Sidelying Rolling: Contact guard assist Sidelying to sit: Min assist, HOB elevated Sit to sidelying: Min assist General bed mobility comments: cues for sequencing; pt independently reached for pillow to  hold to his chest, light min A to elevate to sitting and to return LEs to bed at end of session, pt reporting he sleeps in recliner at baseline Transfers Overall transfer level: Needs assistance Equipment used: None Transfers: Sit to/from Stand Sit to Stand: Contact guard assist General transfer comment: from EOB x 2 reps without imbalance Ambulation/Gait Ambulation/Gait assistance: Min assist, Contact guard assist Gait Distance (Feet): 470 Feet Assistive device: None Gait Pattern/deviations: Step-through pattern, Wide base of support, Decreased stride length General Gait Details: slight incr lateral sway/imbalance multiple times, especailly with balance challenges/head turns Gait velocity: decr Gait velocity interpretation: <1.8 ft/sec, indicate of risk for recurrent falls    ADL: ADL Overall ADL's : Needs assistance/impaired Grooming: Set up, Supervision/safety, Sitting Upper Body Bathing: Moderate assistance, Minimal assistance, Maximal assistance, Sitting Lower Body Bathing: Moderate assistance, Sit to/from stand Upper Body Dressing : Moderate assistance, Sitting Lower Body Dressing: Moderate assistance, Sit to/from stand Toilet Transfer: Minimal assistance, Ambulation Toileting- Clothing Manipulation and Hygiene: Minimal assistance Functional mobility during ADLs: Minimal assistance, +2 for safety/equipment General ADL Comments: began education on sternotomy precautions  Cognition: Cognition Orientation Level: Oriented X4 Cognition Arousal: Alert Behavior During Therapy: WFL for tasks assessed/performed   Review of Systems  Constitutional:  Negative for chills and fever.  HENT:  Negative for congestion.   Eyes:  Negative for blurred vision and double vision.  Respiratory:  Positive for shortness of breath.   Cardiovascular:  Positive for chest pain.  Gastrointestinal:  Positive for diarrhea and nausea. Negative for abdominal pain and constipation.  Genitourinary:         Urostomy  Musculoskeletal:  Negative for joint pain.  Skin:  Negative for rash.  Neurological:  Positive for dizziness, sensory change and weakness.  Negative for speech change and headaches.  Psychiatric/Behavioral:  Negative for depression.    Past Medical History:  Diagnosis Date   Atrial fibrillation [facility])    Bladder cancer (HCC) 2006   Cataracts, both eyes    Chronic kidney disease     BLADDER CANCER 2006  UROSTOMY    Colon adenocarcinoma (HCC) 2001   T2, N1   Erosive esophagitis    GERD (gastroesophageal reflux disease)    Hiatal hernia    History of blood transfusion    Hyperlipidemia    Hypertension    Internal hemorrhoids    Neuropathy    feet   PONV (postoperative nausea and vomiting)    Prediabetes    Tubular adenoma of colon 12/2009   Past Surgical History:  Procedure Laterality Date   APPENDECTOMY     BLADDER REMOVAL  2006   has urostomy//hx bladder ca   CARDIAC CATHETERIZATION     CHOLECYSTECTOMY     1999   COLON SURGERY     COLONOSCOPY  2022   CORONARY ARTERY BYPASS GRAFT N/A 07/22/2023   Procedure: CORONARY ARTERY BYPASS GRAFTING X 2, USING LEFT INTERNAL MAMMARY ARTERY AND RIGHT ENDOSCOPIC HARVESTED GREATER SAPHENOUS VEIN;  Surgeon: [name], MD;  Location: [facility] OR;  Service: Open Heart Surgery;  Laterality: N/A;   CORONARY PRESSURE/FFR STUDY N/A 07/19/2023   Procedure: CORONARY PRESSURE/FFR STUDY;  Surgeon: Kyra Phy, MD;  Location: [facility] INVASIVE CV LAB;  Service: Cardiovascular;  Laterality: N/A;   INTRAOPERATIVE TRANSESOPHAGEAL ECHOCARDIOGRAM N/A 07/22/2023   Procedure: ECHOCARDIOGRAM, TRANSESOPHAGEAL, INTRAOPERATIVE;  Surgeon: [name], MD;  Location: [facility] OR;  Service: Open Heart Surgery;  Laterality: N/A;   LEFT HEART CATH AND CORONARY ANGIOGRAPHY N/A 07/19/2023   Procedure: LEFT HEART CATH AND CORONARY ANGIOGRAPHY;  Surgeon: Kyra Phy, MD;  Location: [facility] INVASIVE CV LAB;  Service: Cardiovascular;  Laterality: N/A;    PROSTATECTOMY  2006   SPINAL CORD STIMULATOR REMOVAL  12/29/2010   Procedure: CERVICAL SPINAL CORD STIMULATOR REMOVAL;  Surgeon: Lei Pump, MD;  Location: [facility] NEURO ORS;  Service: Neurosurgery;  Laterality: N/A;  REMOVAL OF SPINAL CORD STIMULATOR, ELECTRODE AND IPG   Family History  Problem Relation Age of Onset   Heart failure Mother    Breast cancer Sister    Colon cancer Neg Hx    Stomach cancer Neg Hx    Esophageal cancer Neg Hx    Rectal cancer Neg Hx    Colon polyps Neg Hx    Social History:  reports that he has never smoked. He has never used smokeless tobacco. He reports that he does not drink alcohol and does not use drugs. Allergies:  Allergies  Allergen Reactions   Penicillins Hives and Other (See Comments)    rash   Atorvastatin     Other reaction(s): muscle aches   Contrast Media  [Iodinated Contrast Media]    Medications Prior to Admission  Medication Sig Dispense Refill   Ascorbic Acid (VITAMIN C PO) Take 1 tablet by mouth daily.     aspirin  325 MG tablet Take 325 mg by mouth as needed for mild pain (pain score 1-3) or moderate pain (pain score 4-6).     aspirin  EC 81 MG tablet Take 81 mg by mouth in the morning. Swallow whole.     bisacodyl  (DULCOLAX) 5 MG EC tablet Take 5 mg by mouth daily as needed.     Cyanocobalamin (VITAMIN B12 PO) Take 1 tablet by mouth daily.  DULoxetine  (CYMBALTA ) 30 MG capsule Take 30 mg by mouth daily.     ferrous sulfate 324 MG TBEC Take 324 mg by mouth daily.     MAGNESIUM  PO Take 1 tablet by mouth daily.     metFORMIN (GLUCOPHAGE) 500 MG tablet Take 500 mg by mouth as needed.     Multiple Vitamins-Minerals (MULTIVITAMINS THER. W/MINERALS) TABS Take 1 tablet by mouth daily.     nitrofurantoin (MACRODANTIN) 100 MG capsule Take 100 mg by mouth daily.     Oxycodone  HCl 10 MG TABS Take 1 tablet by mouth every 6 (six) hours as needed.     temazepam  (RESTORIL ) 30 MG capsule Take 30 mg by mouth at bedtime. For sleep.       Blood  pressure (!) 106/59, pulse 66, temperature 97.8 F (36.6 C), temperature source Oral, resp. rate 17, height 5' 5 (1.651 m), weight 74.8 kg, SpO2 94%. Physical Exam  General: No apparent distress HEENT: Head is normocephalic, atraumatic, sclera anicteric, oral mucosa pink and moist Neck: Supple without JVD or lymphadenopathy Heart: Reg rate and rhythm. No murmurs rubs or gallops Chest: CTA bilaterally without wheezes, rales, or rhonchi; no distress, sternal incision CDI Abdomen: Soft, non-tender, non-distended, bowel sounds positive. + urostomy  Extremities: No clubbing, cyanosis, trace LE edema. Pulses are 2+ Psych: Pt's affect is appropriate. Pt is cooperative Skin: Clean and intact without signs of breakdown Neuro:    Mental Status: AAOx3 Speech/Languate: Naming and repetition intact, fluent, follows simple commands CRANIAL NERVES:2-12 grossly intact   MOTOR: RUE: at least 4/5 LUE: at least 4/5  RLE: HF 4/5, KE 5/5, ADF 4/5, APF 4/5 LLE: HF 4/5, KE 5/5, ADF 4/5, APF 4/5   SENSORY: Altered in distal feet b/l     Results for orders placed or performed during the hospital encounter of 07/16/23 (from the past 24 hours)  Glucose, capillary     Status: Abnormal   Collection Time: 07/26/23  4:44 PM  Result Value Ref Range   Glucose-Capillary 141 (H) 70 - 99 mg/dL  Glucose, capillary     Status: Abnormal   Collection Time: 07/26/23  9:22 PM  Result Value Ref Range   Glucose-Capillary 110 (H) 70 - 99 mg/dL   Comment 1 Notify RN    Comment 2 Document in Chart   Magnesium      Status: None   Collection Time: 07/27/23  3:13 AM  Result Value Ref Range   Magnesium  2.0 1.7 - 2.4 mg/dL  CBC     Status: Abnormal   Collection Time: 07/27/23  3:13 AM  Result Value Ref Range   WBC 9.5 4.0 - 10.5 K/uL   RBC 3.25 (L) 4.22 - 5.81 MIL/uL   Hemoglobin 9.2 (L) 13.0 - 17.0 g/dL   HCT 14.7 (L) 82.9 - 56.2 %   MCV 88.6 80.0 - 100.0 fL   MCH 28.3 26.0 - 34.0 pg   MCHC 31.9 30.0 - 36.0  g/dL   RDW 13.0 86.5 - 78.4 %   Platelets 120 (L) 150 - 400 K/uL   nRBC 0.0 0.0 - 0.2 %  Basic metabolic panel     Status: Abnormal   Collection Time: 07/27/23  3:13 AM  Result Value Ref Range   Sodium 136 135 - 145 mmol/L   Potassium 4.2 3.5 - 5.1 mmol/L   Chloride 107 98 - 111 mmol/L   CO2 20 (L) 22 - 32 mmol/L   Glucose, Bld 86 70 - 99 mg/dL   BUN  25 (H) 8 - 23 mg/dL   Creatinine, Ser 1.30 (H) 0.61 - 1.24 mg/dL   Calcium  8.4 (L) 8.9 - 10.3 mg/dL   GFR, Estimated 56 (L) >60 mL/min   Anion gap 9 5 - 15  Glucose, capillary     Status: None   Collection Time: 07/27/23  6:25 AM  Result Value Ref Range   Glucose-Capillary 84 70 - 99 mg/dL   Comment 1 Notify RN    Comment 2 Document in Chart   Glucose, capillary     Status: None   Collection Time: 07/27/23 11:08 AM  Result Value Ref Range   Glucose-Capillary 96 70 - 99 mg/dL   DG Chest 2 View Result Date: 07/26/2023 CLINICAL DATA:  [id number].  Status post CABG surgery. EXAM: CHEST - 2 VIEW COMPARISON:  Portable chest 07/24/2023 FINDINGS: Right IJ catheter introducer sheath remains in place with tip in the upper SVC. There is a minimal, 3% or less volume left apical pneumothorax, unchanged. Stable CABG change. The cardiac size is normal. Left chest tube and mediastinal drain have been removed. The mediastinum is stable. There is aortic atherosclerosis. Small left pleural effusion with atelectasis or consolidation in left lower lobe. Remaining lungs are clear with no new abnormality. There is stable overall aeration. IMPRESSION: 1. Stable minimal, 3% or less volume left apical pneumothorax. 2. Small left pleural effusion with atelectasis or consolidation in the left lower lobe. 3. Interval removal of left chest tube and mediastinal drain. 4. Aortic atherosclerosis. Electronically Signed   By: Denman Fischer M.D.   On: 07/26/2023 06:57    Assessment/Plan: Diagnosis: CAD s/p 2v CABG Does the need for close, 24 hr/day medical supervision in  concert with the patient's rehab needs make it unreasonable for this patient to be served in a less intensive setting? Yes Co-Morbidities requiring supervision/potential complications:  -A-fib, CKD, bladder cancer, colon cancer in remission, hypertension, prediabetes, hyperlipidemia, peripheral neuropathy Due to bladder management, bowel management, safety, skin/wound care, disease management, medication administration, pain management, and patient education, does the patient require 24 hr/day rehab nursing? Yes Does the patient require coordinated care of a physician, rehab nurse, therapy disciplines of PT/OT to address physical and functional deficits in the context of the above medical diagnosis(es)? Yes Addressing deficits in the following areas: balance, endurance, locomotion, strength, transferring, bowel/bladder control, bathing, dressing, feeding, grooming, toileting, cognition, speech, language, swallowing, and psychosocial support Can the patient actively participate in an intensive therapy program of at least 3 hrs of therapy per day at least 5 days per week? Yes The potential for patient to make measurable gains while on inpatient rehab is excellent Anticipated functional outcomes upon discharge from inpatient rehab are modified independent  with PT, modified independent with OT, n/a with SLP. Estimated rehab length of stay to reach the above functional goals is: 7-10 Anticipated discharge destination: Home Overall Rehab/Functional Prognosis: excellent  POST ACUTE RECOMMENDATIONS: This patient's condition is appropriate for continued rehabilitative care in the following setting: CIR and SNF Patient has agreed to participate in recommended program. Yes Note that insurance prior authorization may be required for reimbursement for recommended care.  Comment: I think he could get Mod I and could be candidate for CIR if he has intermittant assistance at home. Would like to confirm family  support options and if he does not have any assistance SNF may be option in this case. Rehab coordinator to f/u.    MEDICAL RECOMMENDATIONS: Consider outpatient [place] treatment/referral for his b/l Neuropathy pain.  I have personally performed a face to face diagnostic evaluation of this patient. Additionally, I have examined the patient's medical record including any pertinent labs and radiographic images.    Thanks,  Lylia Sand, MD 07/27/2023

## 2023-07-27 NOTE — Progress Notes (Signed)
 Physical Therapy Treatment Patient Details Name: Mark [name] MRN: [id number] DOB: [birth year] Today's Date: 07/27/2023   History of Present Illness 73 yo presented 07/16/23 due to chest pain. EKG and troponins not indicative of ischemia. CTA chest indicated moderate 3 vessel disease; 6/3 L heart cath; 6/6 CABGx2; post-op vasoplegia with hypotension    PT Comments  Pt resting in bed on arrival, daughter present at bedside and supportive, with pt demonstrating continued progress towards acute goals. Pt continues to require cues for adherence to all precautions throughout mobility and continues to be limited in safe mobility by decreased activity tolerance from baseline, impaired balance/postural reactions and pain. Pt requiring min A to complete bed mobility and grossly CGA to complete transfers to stand and gait without AD support. Pt able to accept mild balance challenges with increased postural sway but no overt LOB. Pt continues to benefit from skilled PT services to progress toward functional mobility goals.     If plan is discharge home, recommend the following: Assistance with cooking/housework;Assist for transportation;Help with stairs or ramp for entrance   Can travel by private vehicle        Equipment Recommendations  Other (comment) (TBD; has cane and may be enough)    Recommendations for Other Services       Precautions / Restrictions Precautions Precautions: Sternal;Fall Precaution Booklet Issued: No Recall of Precautions/Restrictions: Impaired Precaution/Restrictions Comments: questioning cues to adhere Restrictions Weight Bearing Restrictions Per Provider Order: Yes Other Position/Activity Restrictions: sternal     Mobility  Bed Mobility Overal bed mobility: Needs Assistance Bed Mobility: Rolling, Sidelying to Sit, Sit to Sidelying Rolling: Contact guard assist Sidelying to sit: Min assist, HOB elevated     Sit to sidelying: Min assist General bed mobility  comments: cues for sequencing; pt independently reached for pillow to hold to his chest, light min A to elevate to sitting and to return LEs to bed at end of session, pt reporting he sleeps in recliner at baseline    Transfers Overall transfer level: Needs assistance Equipment used: None Transfers: Sit to/from Stand Sit to Stand: Contact guard assist           General transfer comment: from EOB x 2 reps without imbalance    Ambulation/Gait Ambulation/Gait assistance: Min assist, Contact guard assist Gait Distance (Feet): 470 Feet Assistive device: None Gait Pattern/deviations: Step-through pattern, Wide base of support, Decreased stride length Gait velocity: decr     General Gait Details: slight incr lateral sway/imbalance multiple times, especailly with balance challenges/head turns   Stairs             Wheelchair Mobility     Tilt Bed    Modified Rankin (Stroke Patients Only)       Balance Overall balance assessment: Needs assistance Sitting-balance support: No upper extremity supported, Feet supported Sitting balance-Leahy Scale: Good     Standing balance support: No upper extremity supported Standing balance-Leahy Scale: Fair Standing balance comment: abla to accept mild dynamic ba;ance challenges with slightly increased postural sway                            Communication Communication Communication: No apparent difficulties  Cognition Arousal: Alert Behavior During Therapy: WFL for tasks assessed/performed   PT - Cognitive impairments: Safety/Judgement                         Following commands: Intact  Cueing Cueing Techniques: Verbal cues  Exercises      General Comments General comments (skin integrity, edema, etc.): Daughter present and very concerned re: pt's willingness to ask friends for help on discharge. Concerned pt will not have enough support to be safe at home alone      Pertinent Vitals/Pain  Pain Assessment Pain Assessment: Faces Faces Pain Scale: Hurts a little bit Pain Location: chest, bil feet Pain Descriptors / Indicators: Grimacing, Guarding Pain Intervention(s): Monitored during session, Limited activity within patient's tolerance, Patient requesting pain meds-RN notified    Home Living                          Prior Function            PT Goals (current goals can now be found in the care plan section) Acute Rehab PT Goals Patient Stated Goal: be able to care for himself PT Goal Formulation: With patient Time For Goal Achievement: 08/09/23 Progress towards PT goals: Progressing toward goals    Frequency    Min 3X/week      PT Plan      Co-evaluation              AM-PAC PT 6 Clicks Mobility   Outcome Measure  Help needed turning from your back to your side while in a flat bed without using bedrails?: A Little Help needed moving from lying on your back to sitting on the side of a flat bed without using bedrails?: A Little Help needed moving to and from a bed to a chair (including a wheelchair)?: A Little Help needed standing up from a chair using your arms (e.g., wheelchair or bedside chair)?: A Little Help needed to walk in hospital room?: A Little Help needed climbing 3-5 steps with a railing? : A Lot 6 Click Score: 17    End of Session Equipment Utilized During Treatment: Gait belt Activity Tolerance: Patient tolerated treatment well Patient left: with call bell/phone within reach;with family/visitor present;in bed;with nursing/sitter in room Nurse Communication: Mobility status PT Visit Diagnosis: Unsteadiness on feet (R26.81);Difficulty in walking, not elsewhere classified (R26.2)     Time: 1610-9604 PT Time Calculation (min) (ACUTE ONLY): 27 min  Charges:    $Gait Training: 8-22 mins $Therapeutic Activity: 8-22 mins PT General Charges $$ ACUTE PT VISIT: 1 Visit                     Timmia Cogburn R. PTA Acute  Rehabilitation Services Office: [phone]   Agapito Horseman 07/27/2023, 11:50 AM

## 2023-07-27 NOTE — Progress Notes (Signed)
 IP rehab admissions - I met with patient and his daughter, [name].  Patient lives alone and has minimal support.  Dtr lives in [place]  and can be reached at [phone].  Son works and travels for his job and does not live close to patient.  Dtr and patient would like SNF placement.  First choice is [facility], 2nd choice is [facility] in [employer] and 3rd choice is [facility].  I have shared this info with unit CM for follow up.  [phone]

## 2023-07-27 NOTE — NC FL2 (Signed)
 [place]  MEDICAID [id number] LEVEL OF CARE FORM     IDENTIFICATION  Patient Name: Mark [name] Birthdate: [birth year] Sex: male Admission Date (Current Location): 07/16/2023  [facility] and [place] Number:  Producer, television/film/video and Address:  [facility], 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   Deviated nasal septum 10/10/2013   Disorder of kidney and ureter 10/10/2013   ED (erectile dysfunction) of organic origin 10/10/2013   Hearing loss 10/10/2013   Sensorineural hearing loss 10/10/2013   Hematuria 10/10/2013   Hypercholesteremia 10/10/2013   Hypertrophy of nasal turbinates 10/10/2013   Hypothyroidism 10/10/2013   Intestinal infection due to other organism, not elsewhere classified 10/10/2013   Lower urinary tract infectious disease 10/10/2013   Malignant neoplasm of urinary bladder (HCC) 10/10/2013   Tinnitus 10/10/2013   OTHER DYSPHAGIA 11/14/2007   GERD 11/10/2007   ADENOCARCINOMA, COLON, HX OF 11/10/2007   History of colonic polyps 11/10/2007    Orientation RESPIRATION BLADDER Height & Weight     Self, Time, Situation, Place    Continent Weight: 164 lb 14.5 oz (74.8 kg) Height:  5' 5 (165.1 cm)  BEHAVIORAL SYMPTOMS/MOOD NEUROLOGICAL BOWEL NUTRITION STATUS      Continent Diet (please see discharge summary)  AMBULATORY STATUS COMMUNICATION OF  NEEDS Skin   Limited Assist   Surgical wounds (closed incision leg, closed incision chest)                       Personal Care Assistance Level of Assistance  Bathing, Feeding, Dressing Bathing Assistance: Limited assistance Feeding assistance: Independent Dressing Assistance: Limited assistance     Functional Limitations Info  Sight, Hearing, Speech Sight Info: Adequate Hearing Info: Adequate Speech Info: Adequate    SPECIAL CARE FACTORS FREQUENCY  PT (By licensed PT), OT (By licensed OT)     PT Frequency: 5x per week OT Frequency: 5x per week            Contractures Contractures Info: Not present    Additional Factors Info  Code Status, Allergies Code Status Info: FULL Allergies Info: Penicillins,Atorvastatin,iodinated Contrast           Current Medications (07/27/2023):  This is the current hospital active medication list Current Facility-Administered Medications  Medication Dose Route Frequency Provider Last Rate Last Admin   acetaminophen  (TYLENOL ) tablet 1,000 mg  1,000 mg Oral Q6H [name], MD   1,000 mg at 07/27/23 1130   Or   acetaminophen  (TYLENOL ) 160 MG/5ML solution 1,000 mg  1,000 mg Per Tube Q6H [name], MD       alum & mag hydroxide-simeth (MAALOX/MYLANTA) 200-200-20 MG/5ML suspension 15 mL  15 mL Oral Q4H PRN Hendrickson, Steven C, MD       amiodarone (PACERONE) tablet 400 mg  400 mg Oral BID [name], MD  400 mg at 07/27/23 0827   [START ON 07/28/2023] aspirin  EC tablet 81 mg  81 mg Oral Daily Stehler, Bailey C, PA-C       bisacodyl  (DULCOLAX) EC tablet 10 mg  10 mg Oral Daily Hendrickson, Steven C, MD   10 mg at 07/27/23 4098   Or   bisacodyl  (DULCOLAX) suppository 10 mg  10 mg Rectal Daily [name], MD       clopidogrel (PLAVIX) tablet 75 mg  75 mg Oral Daily Randa Burton, PA-C   75 mg at 07/27/23 1191   docusate sodium  (COLACE) capsule 200 mg  200 mg Oral Daily Hendrickson, Steven C,  MD   200 mg at 07/27/23 4782   DULoxetine  (CYMBALTA ) DR capsule 30 mg  30 mg Oral Daily Hendrickson, Steven C, MD   30 mg at 07/27/23 0827   ezetimibe  (ZETIA ) tablet 10 mg  10 mg Oral Daily [name], MD   10 mg at 07/27/23 0827   magnesium  hydroxide (MILK OF MAGNESIA) suspension 30 mL  30 mL Oral Daily PRN Hendrickson, Steven C, MD       metFORMIN (GLUCOPHAGE) tablet 500 mg  500 mg Oral Q breakfast [name], MD   500 mg at 07/26/23 9562   methocarbamol (ROBAXIN) tablet 500 mg  500 mg Oral TID [name], MD   500 mg at 07/27/23 1555   metoprolol  tartrate (LOPRESSOR ) injection 2.5-5 mg  2.5-5 mg Intravenous Q2H PRN [name], MD   3 mg at 07/24/23 0414   midodrine (PROAMATINE) tablet 10 mg  10 mg Oral TID WC Hendrickson, Steven C, MD   10 mg at 07/27/23 1604   ondansetron  (ZOFRAN ) injection 4 mg  4 mg Intravenous Q6H PRN Hendrickson, Steven C, MD   4 mg at 07/27/23 1138   Oral care mouth rinse  15 mL Mouth Rinse PRN [name], MD       oxyCODONE  (Oxy IR/ROXICODONE ) immediate release tablet 5-10 mg  5-10 mg Oral Q3H PRN [name], MD   5 mg at 07/27/23 1556   pantoprazole  (PROTONIX ) EC tablet 40 mg  40 mg Oral Daily [name], MD   40 mg at 07/27/23 1308   rosuvastatin  (CRESTOR ) tablet 40 mg  40 mg Oral Daily [name], MD   40 mg at 07/27/23 0826   sodium chloride  flush (NS) 0.9 % injection 3 mL  3 mL Intravenous Q12H [name], MD   3 mL at 07/27/23 6578   sodium chloride  flush (NS) 0.9 % injection 3 mL  3 mL Intravenous PRN [name], MD       temazepam  (RESTORIL ) capsule 30 mg  30 mg Oral QHS PRN Hendrickson, Steven C, MD   30 mg at 07/26/23 2108     Discharge Medications: Please see discharge summary for a list of discharge medications.  Relevant Imaging Results:  Relevant Lab Results:   Additional Information SSN [id number]  Valery Gaucher, LCSW

## 2023-07-27 NOTE — Progress Notes (Addendum)
 [address]       [place] [address]             [phone]      5 Days Post-Op Procedure(s) (LRB): CORONARY ARTERY BYPASS GRAFTING X 2, USING LEFT INTERNAL MAMMARY ARTERY AND RIGHT ENDOSCOPIC HARVESTED GREATER SAPHENOUS VEIN (N/A) ECHOCARDIOGRAM, TRANSESOPHAGEAL, INTRAOPERATIVE (N/A) Subjective: Patient states he feels good this AM and his pain is manageable. He had 2 bowel movements yesterday.   Objective: Vital signs in last 24 hours: Temp:  [97.5 F (36.4 C)-98.7 F (37.1 C)] 98.4 F (36.9 C) (06/11 0333) Pulse Rate:  [61-75] 75 (06/11 0333) Cardiac Rhythm: Normal sinus rhythm;Bundle branch block (06/10 1909) Resp:  [5-27] 19 (06/11 0333) BP: (93-125)/(45-73) 124/73 (06/11 0333) SpO2:  [94 %-100 %] 100 % (06/11 0333) Weight:  [74.8 kg] 74.8 kg (06/11 0333)  Hemodynamic parameters for last 24 hours:    Intake/Output from previous day: 06/10 0701 - 06/11 0700 In: 480 [P.O.:480] Out: 1100 [Urine:1100] Intake/Output this shift: No intake/output data recorded.  General appearance: alert, cooperative, and no distress Neurologic: intact Heart: regular rate and rhythm, S1, S2 normal, no murmur, click, rub or gallop Lungs: slightly diminished bibasilar breath sounds Abdomen: soft, non-tender; bowel sounds normal; no masses,  no organomegaly Extremities: extremities normal, atraumatic, no cyanosis or edema Wound: Clean and dry without sign of infection  Lab Results: Recent Labs    07/26/23 0406 07/27/23 0313  WBC 10.5 9.5  HGB 8.9* 9.2*  HCT 27.7* 28.8*  PLT 90* 120*   BMET:  Recent Labs    07/26/23 0406 07/27/23 0313  NA 137 136  K 4.3 4.2  CL 108 107  CO2 23 20*  GLUCOSE 101* 86  BUN 23 25*  CREATININE 1.32* 1.33*  CALCIUM  8.2* 8.4*    PT/INR: No results for input(s): LABPROT, INR in the last 72 hours. ABG    Component Value Date/Time   PHART 7.338 (L) 07/22/2023 1926   HCO3 21.6 07/22/2023 1926   TCO2 23 07/22/2023  1926   ACIDBASEDEF 4.0 (H) 07/22/2023 1926   O2SAT 96 07/22/2023 1926   CBG (last 3)  Recent Labs    07/26/23 1644 07/26/23 2122 07/27/23 0625  GLUCAP 141* 110* 84    Assessment/Plan: S/P Procedure(s) (LRB): CORONARY ARTERY BYPASS GRAFTING X 2, USING LEFT INTERNAL MAMMARY ARTERY AND RIGHT ENDOSCOPIC HARVESTED GREATER SAPHENOUS VEIN (N/A) ECHOCARDIOGRAM, TRANSESOPHAGEAL, INTRAOPERATIVE (N/A)  CV: NSR, HR 60s. Hx of afib on PO Amiodarone 400mg  BID. Hypotension on Midodrine 10mg  TID. SBP 99-120s. Hx of unstable angina/possible NSTEMI? Will discuss Plavix with surgeon.  Pulm: Saturating well on RA. CXR with stable minimal left apical pneumothorax, small left pleural effusion left basilar atelectasis. Encourage IS and ambulation.   GI: +BM yesterday, tolerating a diet.   Endo: Preop A1C 6.1, likely prediabetic. Metformin has been restarted. Will need close outpatient medicine follow up. CBGs have been controlled, will d/c SSI and CBGs.   Renal: Cr 1.33 this AM, stable from yesterday. Looks like baseline is around 1.2. UO 1100cc/24hrs. Under preop weight.   ID: Leukocytosis resolved, afebrile.   Expected postop ABLA: H/H 9.2/28.8, improved. Not clinically significant at this time. Thrombocytopenia improving, plt 120,000.   DVT Prophylaxis: Lovenox  has been held due to thrombocytopenia.   Deconditioning: Patient lives alone but son lives in town and has friends that will help at discharge. PT/OT recommending CIR, will consult them. Continue to work with PT/OT  Dispo: Consult CIR for  possible admission. Hopefully can discharge by the end of the week.   LOS: 8 days    Randa Burton, PA-C 07/27/2023 Patient seen and examined, agree with findings and plan outlined above SNF vs CIR  Landon Pinion C. Luna Salinas, MD [facility] [phone]

## 2023-07-28 ENCOUNTER — Inpatient Hospital Stay (HOSPITAL_COMMUNITY)

## 2023-07-28 LAB — GLUCOSE, CAPILLARY
Glucose-Capillary: 97 mg/dL (ref 70–99)
Glucose-Capillary: 116 mg/dL — ABNORMAL HIGH (ref 70–99)

## 2023-07-28 LAB — MAGNESIUM: Magnesium: 1.9 mg/dL (ref 1.7–2.4)

## 2023-07-28 MED ORDER — MIDODRINE HCL 10 MG PO TABS: 10.0000 mg | ORAL_TABLET | Freq: Three times a day (TID) | ORAL | Status: DC

## 2023-07-28 MED ORDER — OXYCODONE HCL 10 MG PO TABS: 10.0000 mg | ORAL_TABLET | Freq: Four times a day (QID) | ORAL | 0 refills | Status: DC | PRN

## 2023-07-28 MED ORDER — CLOPIDOGREL BISULFATE 75 MG PO TABS: 75.0000 mg | ORAL_TABLET | Freq: Every day | ORAL | Status: DC

## 2023-07-28 MED ORDER — AMIODARONE HCL 200 MG PO TABS: ORAL_TABLET | ORAL | 1 refills | Status: DC

## 2023-07-28 MED ORDER — EZETIMIBE 10 MG PO TABS
10.0000 mg | ORAL_TABLET | Freq: Every day | ORAL | 1 refills | Status: AC
Start: 2023-07-28 — End: ?

## 2023-07-28 MED ORDER — ACETAMINOPHEN 325 MG PO TABS
650.0000 mg | ORAL_TABLET | Freq: Four times a day (QID) | ORAL | Status: DC | PRN
  Administered 2023-07-28 – 2023-07-30 (×6): 650 mg via ORAL
  Filled 2023-07-28 (×6): qty 2

## 2023-07-28 MED ORDER — ROSUVASTATIN CALCIUM 40 MG PO TABS: 40.0000 mg | ORAL_TABLET | Freq: Every day | ORAL | Status: DC

## 2023-07-28 MED FILL — Electrolyte-R (PH 7.4) Solution: INTRAVENOUS | Qty: 4000 | Status: AC

## 2023-07-28 MED FILL — Calcium Chloride Inj 10%: INTRAVENOUS | Qty: 10 | Status: AC

## 2023-07-28 MED FILL — Sodium Bicarbonate IV Soln 8.4%: INTRAVENOUS | Qty: 50 | Status: AC

## 2023-07-28 MED FILL — Heparin Sodium (Porcine) Inj 1000 Unit/ML: INTRAMUSCULAR | Qty: 10 | Status: AC

## 2023-07-28 MED FILL — Mannitol IV Soln 20%: INTRAVENOUS | Qty: 500 | Status: AC

## 2023-07-28 MED FILL — Sodium Chloride IV Soln 0.9%: INTRAVENOUS | Qty: 1000 | Status: AC

## 2023-07-28 MED FILL — Lidocaine HCl Local Soln Prefilled Syringe 100 MG/5ML (2%): INTRAMUSCULAR | Qty: 5 | Status: AC

## 2023-07-28 NOTE — Progress Notes (Signed)
 Physical Therapy Treatment Patient Details Name: Mark [name] MRN: [id number] DOB: Mar 09, 1950 Today's Date: 07/28/2023   History of Present Illness 73 yo presented 07/16/23 due to chest pain. EKG and troponins not indicative of ischemia. CTA chest indicated moderate 3 vessel disease; 6/3 L heart cath; 6/6 CABGx2; post-op vasoplegia with hypotension    PT Comments  Pt resting in bed on arrival, pleasant and agreeable to session with continued progress towards acute goals. Pt with good recall and adherence to all precautions throughout session with light cues for hand placement during transfers sit>stand.  Pt demonstrating gait with grossly CGA for safety without AD support with no overt LOB noted this session. Pt requesting to return to supine at end of session and requiring light min A to return Les to bed. Pt continues to benefit from skilled PT services to progress toward functional mobility goals.      If plan is discharge home, recommend the following: Assistance with cooking/housework;Assist for transportation;Help with stairs or ramp for entrance   Can travel by private vehicle        Equipment Recommendations  Other (comment)    Recommendations for Other Services       Precautions / Restrictions Precautions Precautions: Sternal;Fall Precaution Booklet Issued: No Recall of Precautions/Restrictions: Impaired Precaution/Restrictions Comments: cues to adhere Restrictions Weight Bearing Restrictions Per Provider Order: Yes Other Position/Activity Restrictions: sternal     Mobility  Bed Mobility Overal bed mobility: Needs Assistance Bed Mobility: Rolling, Sidelying to Sit Rolling: Contact guard assist Sidelying to sit: Contact guard assist     Sit to sidelying: Min assist General bed mobility comments: light assist to return LLE to bed    Transfers Overall transfer level: Needs assistance Equipment used: None Transfers: Sit to/from Stand Sit to Stand: Contact guard  assist           General transfer comment: CGA for safety, light cues for hand placement    Ambulation/Gait Ambulation/Gait assistance: Supervision Gait Distance (Feet): 470 Feet Assistive device: None Gait Pattern/deviations: Step-through pattern, Wide base of support, Decreased stride length Gait velocity: decr     General Gait Details: no ovet LOB noted   Stairs             Wheelchair Mobility     Tilt Bed    Modified Rankin (Stroke Patients Only)       Balance Overall balance assessment: Needs assistance Sitting-balance support: No upper extremity supported, Feet supported Sitting balance-Leahy Scale: Good Sitting balance - Comments: EOB   Standing balance support: No upper extremity supported Standing balance-Leahy Scale: Fair                              Hotel manager: No apparent difficulties  Cognition Arousal: Alert Behavior During Therapy: WFL for tasks assessed/performed                             Following commands: Intact      Cueing Cueing Techniques: Verbal cues  Exercises      General Comments General comments (skin integrity, edema, etc.): VSS on RA      Pertinent Vitals/Pain Pain Assessment Pain Assessment: Faces Faces Pain Scale: Hurts a little bit Pain Location: chest Pain Descriptors / Indicators: Grimacing, Guarding Pain Intervention(s): Monitored during session, Limited activity within patient's tolerance    Home Living  Prior Function            PT Goals (current goals can now be found in the care plan section) Acute Rehab PT Goals PT Goal Formulation: With patient Time For Goal Achievement: 08/09/23 Progress towards PT goals: Progressing toward goals    Frequency    Min 3X/week      PT Plan      Co-evaluation              AM-PAC PT 6 Clicks Mobility   Outcome Measure  Help needed turning from your  back to your side while in a flat bed without using bedrails?: A Little Help needed moving from lying on your back to sitting on the side of a flat bed without using bedrails?: A Little Help needed moving to and from a bed to a chair (including a wheelchair)?: A Little Help needed standing up from a chair using your arms (e.g., wheelchair or bedside chair)?: A Little Help needed to walk in hospital room?: A Little Help needed climbing 3-5 steps with a railing? : A Lot 6 Click Score: 17    End of Session Equipment Utilized During Treatment: Gait belt Activity Tolerance: Patient tolerated treatment well Patient left: in bed;with call bell/phone within reach Nurse Communication: Mobility status PT Visit Diagnosis: Unsteadiness on feet (R26.81);Difficulty in walking, not elsewhere classified (R26.2)     Time: 1610-9604 PT Time Calculation (min) (ACUTE ONLY): 14 min  Charges:    $Gait Training: 8-22 mins PT General Charges $$ ACUTE PT VISIT: 1 Visit                     Offie Waide R. PTA Acute Rehabilitation Services Office: [phone]   Agapito Horseman 07/28/2023, 3:28 PM

## 2023-07-28 NOTE — Progress Notes (Addendum)
                  [address]           [place], [place] [address]                     [phone]        6 Days Post-Op Procedure(s) (LRB): CORONARY ARTERY BYPASS GRAFTING X 2, USING LEFT INTERNAL MAMMARY ARTERY AND RIGHT ENDOSCOPIC HARVESTED GREATER SAPHENOUS VEIN (N/A) ECHOCARDIOGRAM, TRANSESOPHAGEAL, INTRAOPERATIVE (N/A)  Subjective: Patient walked once yesterday. He feels fairly well and has no specific complaint this am  Objective: Vital signs in last 24 hours: Temp:  [97.7 F (36.5 C)-98.8 F (37.1 C)] 98.7 F (37.1 C) (06/12 0314) Pulse Rate:  [66-78] 72 (06/12 0314) Cardiac Rhythm: Heart block (06/11 1912) Resp:  [15-20] 18 (06/12 0314) BP: (99-138)/(51-59) 109/54 (06/12 0314) SpO2:  [94 %-99 %] 99 % (06/12 0314) Weight:  [74.2 kg] 74.2 kg (06/12 0314)  Pre op weight 74.4 kg Current Weight  07/28/23 74.2 kg       Intake/Output from previous day: 06/11 0701 - 06/12 0700 In: -  Out: 800 [Urine:800]   Physical Exam:  Cardiovascular: RRR Pulmonary: Clear to auscultation bilaterally Abdomen: Soft, non tender, bowel sounds present. Extremities: No lower extremity edema. Wounds: Clean and dry.  No erythema or signs of infection.  Lab Results: CBC: Recent Labs    07/26/23 0406 07/27/23 0313  WBC 10.5 9.5  HGB 8.9* 9.2*  HCT 27.7* 28.8*  PLT 90* 120*   BMET:  Recent Labs    07/26/23 0406 07/27/23 0313  NA 137 136  K 4.3 4.2  CL 108 107  CO2 23 20*  GLUCOSE 101* 86  BUN 23 25*  CREATININE 1.32* 1.33*  CALCIUM  8.2* 8.4*    PT/INR:  Lab Results  Component Value Date   INR 1.8 (H) 07/22/2023   INR 1.3 (H) 07/21/2023   ABG:  INR: Will add last result for INR, ABG once components are confirmed Will add last 4 CBG results once components are confirmed  Assessment/Plan:  1. CV - History of PAF. SR, first degree heart block. On Amiodarone 400 mg bid, Midodrine 10 mg tid, and Plavix 75 mg daily. 2.  Pulmonary - On room  air. CXR this am appears stable (appears trace right apical pneumothorax resolved, atelectasis). Encourage incentive spirometer. 3.  Expected post op acute blood loss anemia - H and H yesterday stable at 9.2 and 28.8 4. History of CKD (stage III)-creatinine yesterday stable at 1.33 5. History of pre diabetes-CBGs 96/138/97. On Metformin 500 mg at breakfast as taken prior to surgery. Pre op HGA1C 6.1 6. Thrombocytopenia-platelets yesterday up to 120,000 7. Disposition-SNF when ready for discharge;appears ready when bed available  Mark M ZimmermanPA-C 6:54 AM Patient seen and examined, agree with above Apparently now back under consideration for CIR He is ready for CIR or SNF once decision/ approval made  Mark Pinion C. Luna Salinas, MD [facility] [phone]

## 2023-07-28 NOTE — Progress Notes (Addendum)
 Inpatient Rehab Admissions Coordinator:  Pt's daughter Mark [name] left a message for [facility] stating that pt and family are interested in CIR. Attempted to contact Melissa to verify dispo, but no one answered. Left a message; awaiting return call.  Will continue to follow.  1118: Spoke with pt's daughter Mark [name]. She informed AC that pt/family would like to pursue CIR. Reviewed CIR goals and expectations. She acknowledged understanding. She confirmed that family/friends will be able to assist pt after discharge.   Saw pt at bedside. He confirmed that he would like to pursue CIR. Reviewed CIR goals and expectations with him. He acknowledged understanding. Will continue to follow.  Mark Larsen, MS, CCC-SLP Admissions Coordinator [phone]

## 2023-07-28 NOTE — Progress Notes (Signed)
 Occupational Therapy Treatment Patient Details Name: Mark [name] MRN: [id number] DOB: [birth year] Today's Date: 07/28/2023   History of present illness 73 yo presented 07/16/23 due to chest pain. EKG and troponins not indicative of ischemia. CTA chest indicated moderate 3 vessel disease; 6/3 L heart cath; 6/6 CABGx2; post-op vasoplegia with hypotension   OT comments  Patient continues to make good gains with OT treatment. Patient able to recall stay in tube for sternal precautions but required cues to adhere during sit to stands and transfers. Patient performed self care tasks at sink while seated with limited standing.  Patient will benefit from intensive inpatient follow-up therapy, >3 hours/day. Acute OT to continue to follow to address established goals to facilitate DC to next venue of care.        If plan is discharge home, recommend the following:  A little help with walking and/or transfers;A little help with bathing/dressing/bathroom;Assistance with cooking/housework;Direct supervision/assist for medications management;Assist for transportation;Help with stairs or ramp for entrance   Equipment Recommendations  BSC/3in1    Recommendations for Other Services Rehab consult    Precautions / Restrictions Precautions Precautions: Sternal;Fall Precaution Booklet Issued: No Recall of Precautions/Restrictions: Impaired Precaution/Restrictions Comments: cues to adhere Restrictions Weight Bearing Restrictions Per Provider Order: Yes RUE Weight Bearing Per Provider Order: Non weight bearing LUE Weight Bearing Per Provider Order: Non weight bearing Other Position/Activity Restrictions: sternal       Mobility Bed Mobility Overal bed mobility: Needs Assistance Bed Mobility: Rolling, Sidelying to Sit Rolling: Contact guard assist Sidelying to sit: Min assist, HOB elevated       General bed mobility comments: assistance to raise trunk    Transfers Overall transfer level:  Needs assistance Equipment used: Rolling walker (2 wheels) Transfers: Sit to/from Stand Sit to Stand: Min assist           General transfer comment: education on sternal precations with sit to stand and min assist to power up     Balance Overall balance assessment: Needs assistance Sitting-balance support: No upper extremity supported, Feet supported Sitting balance-Leahy Scale: Good Sitting balance - Comments: EOB   Standing balance support: No upper extremity supported Standing balance-Leahy Scale: Fair Standing balance comment: standing at sink during self care tasks with CGA for safety                           ADL either performed or assessed with clinical judgement   ADL Overall ADL's : Needs assistance/impaired     Grooming: Wash/dry hands;Wash/dry face;Oral care;Brushing hair;Set up;Sitting   Upper Body Bathing: Minimal assistance;Sitting   Lower Body Bathing: Moderate assistance;Sit to/from stand   Upper Body Dressing : Moderate assistance;Sitting   Lower Body Dressing: Moderate assistance;Sit to/from stand Lower Body Dressing Details (indicate cue type and reason): to change socks             Functional mobility during ADLs: Minimal assistance;Rolling walker (2 wheels) General ADL Comments: education on sternal precautions with self care tasks    Extremity/Trunk Assessment              Vision       Perception     Praxis     Communication Communication Communication: No apparent difficulties   Cognition Arousal: Alert Behavior During Therapy: WFL for tasks assessed/performed Cognition: No apparent impairments  Following commands: Intact        Cueing   Cueing Techniques: Verbal cues  Exercises      Shoulder Instructions       General Comments      Pertinent Vitals/ Pain       Pain Assessment Pain Assessment: Faces Faces Pain Scale: Hurts a little bit Pain Location:  chest Pain Descriptors / Indicators: Grimacing, Guarding Pain Intervention(s): Monitored during session, Repositioned  Home Living                                          Prior Functioning/Environment              Frequency  Min 2X/week        Progress Toward Goals  OT Goals(current goals can now be found in the care plan section)  Progress towards OT goals: Progressing toward goals  Acute Rehab OT Goals Patient Stated Goal: get better OT Goal Formulation: With patient Time For Goal Achievement: 08/09/23 Potential to Achieve Goals: Good ADL Goals Pt Will Perform Upper Body Bathing: with modified independence;sitting Pt Will Perform Lower Body Bathing: with modified independence;sit to/from stand Pt Will Perform Upper Body Dressing: with modified independence;sitting Pt Will Perform Lower Body Dressing: with modified independence;sit to/from stand Pt Will Transfer to Toilet: with modified independence;ambulating Additional ADL Goal #1: Pt will demonstrate sternal precautions during ADL tasks without correcitonal cues  Plan      Co-evaluation                 AM-PAC OT 6 Clicks Daily Activity     Outcome Measure   Help from another person eating meals?: None Help from another person taking care of personal grooming?: A Little Help from another person toileting, which includes using toliet, bedpan, or urinal?: A Lot Help from another person bathing (including washing, rinsing, drying)?: A Lot Help from another person to put on and taking off regular upper body clothing?: A Lot Help from another person to put on and taking off regular lower body clothing?: A Lot 6 Click Score: 15    End of Session Equipment Utilized During Treatment: Gait belt;Rolling walker (2 wheels)  OT Visit Diagnosis: Unsteadiness on feet (R26.81);Other abnormalities of gait and mobility (R26.89);Muscle weakness (generalized) (M62.81)   Activity Tolerance Patient  tolerated treatment well   Patient Left in chair;with call bell/phone within reach;with chair alarm set   Nurse Communication Mobility status        Time: [phone] OT Time Calculation (min): 26 min  Charges: OT General Charges $OT Visit: 1 Visit OT Treatments $Self Care/Home Management : 23-37 mins  Anitra Barn, OTA Acute Rehabilitation Services  Office [phone]   Jovita Nipper 07/28/2023, 11:57 AM

## 2023-07-28 NOTE — PMR Pre-admission (Incomplete)
 PMR Admission Coordinator Pre-Admission Assessment  Patient: Mark [name] is an 73 y.o., male MRN: [id number] DOB: 09/14/1950 Height: 5' 5 (165.1 cm) Weight: 74.2 kg              Insurance Information HMO: yes    PPO:      PCP:      IPA:      80/20:      OTHER:  PRIMARY: [employer]      Policy#: [id number]      Subscriber: patient CM Name: ***      Phone#: ***     Fax#: *** Pre-Cert#: ***      Employer: *** Benefits:  Phone #: ***     Name: *** Venson Ginger. Date: ***     Deduct: ***      Out of Pocket Max: ***      Life Max: ***  CIR: ***      SNF: *** Outpatient: ***     Co-Pay: *** Home Health: ***      Co-Pay: *** DME: ***     Co-Pay: *** Providers: in-network SECONDARY:       Policy#:       Phone#:   Financial Counselor:       Phone#:   The Data processing manager" for patients in Inpatient Rehabilitation Facilities with attached "Privacy Act Statement-Health Care Records" was provided and verbally reviewed with: {CHL IP Patient Family HY:[id number]}  Emergency Contact Information Contact Information     Name Relation Home Work Mobile   [place] Brother [phone]     Marl, Seago   [phone]      Other Contacts     Name Relation Home Work Mobile   Thompson,Melissa Daughter   [phone]      Current Medical History  Patient Admitting Diagnosis: debility s/p CABG x2 History of Present Illness: Pt is a 73 year old male with medical hx significant for: bladder CA s/p urostomy, hiatl hernia, A-fib, CKD, colon CA, GERD, HTN, hyperlipidemia. Pt presented to [facility] on 07/16/23 d/I complaints of chest pain. EKG nonischemic. Cardiology consulted.  Coronary CTA  showed high coronary calcium  score and moderate 3 vessel CAD. Echo showed EF 55-60%. Pt underwent left heart cath by Dr. Lorie Rook on 07/19/23 which found 65% distal left main stenosis. Pt underwent CABG x2 by Dr. Luna Salinas on 07/22/23. Therapy evaluations completed and CIR recommended.     Glasgow Coma Scale Score: 15  Patient's medical record from [facility] has been reviewed by the rehabilitation admission coordinator and physician.  Past Medical History  Past Medical History:  Diagnosis Date   Atrial fibrillation [facility])    Bladder cancer (HCC) 2006   Cataracts, both eyes    Chronic kidney disease     BLADDER CANCER 2006  UROSTOMY    Colon adenocarcinoma (HCC) 2001   T2, N1   Erosive esophagitis    GERD (gastroesophageal reflux disease)    Hiatal hernia    History of blood transfusion    Hyperlipidemia    Hypertension    Internal hemorrhoids    Neuropathy    feet   PONV (postoperative nausea and vomiting)    Prediabetes    Tubular adenoma of colon 12/2009    Has the patient had major surgery during 100 days prior to admission? Yes  Family History  family history includes Breast cancer in his sister; Heart failure in his mother.   Current Medications   Current Facility-Administered Medications:  acetaminophen  (TYLENOL ) tablet 650 mg, 650 mg, Oral, Q6H PRN, Roddenberry, Myron G, PA-C, 650 mg at 07/28/23 1254   alum & mag hydroxide-simeth (MAALOX/MYLANTA) 200-200-20 MG/5ML suspension 15 mL, 15 mL, Oral, Q4H PRN, Hendrickson, Steven C, MD   amiodarone (PACERONE) tablet 400 mg, 400 mg, Oral, BID, Hendrickson, Steven C, MD, 400 mg at 07/28/23 0900   aspirin  EC tablet 81 mg, 81 mg, Oral, Daily, Randa Burton, PA-C, 81 mg at 07/28/23 0859   bisacodyl  (DULCOLAX) EC tablet 10 mg, 10 mg, Oral, Daily, 10 mg at 07/27/23 0829 **OR** bisacodyl  (DULCOLAX) suppository 10 mg, 10 mg, Rectal, Daily, [name], MD   clopidogrel (PLAVIX) tablet 75 mg, 75 mg, Oral, Daily, Stehler, Barba Bonnet, PA-C, 75 mg at 07/28/23 1610   docusate sodium  (COLACE) capsule 200 mg, 200 mg, Oral, Daily, [name], MD, 200 mg at 07/27/23 9604   DULoxetine  (CYMBALTA ) DR capsule 30 mg, 30 mg, Oral, Daily, Hendrickson, Steven C, MD, 30 mg at 07/28/23 0859    ezetimibe  (ZETIA ) tablet 10 mg, 10 mg, Oral, Daily, [name], MD, 10 mg at 07/28/23 5409   magnesium  hydroxide (MILK OF MAGNESIA) suspension 30 mL, 30 mL, Oral, Daily PRN, Hendrickson, Steven C, MD   metFORMIN (GLUCOPHAGE) tablet 500 mg, 500 mg, Oral, Q breakfast, Hendrickson, Steven C, MD, 500 mg at 07/28/23 8119   metoprolol  tartrate (LOPRESSOR ) injection 2.5-5 mg, 2.5-5 mg, Intravenous, Q2H PRN, [name], MD, 3 mg at 07/24/23 0414   midodrine (PROAMATINE) tablet 10 mg, 10 mg, Oral, TID WC, Hendrickson, Steven C, MD, 10 mg at 07/28/23 1229   ondansetron  (ZOFRAN ) injection 4 mg, 4 mg, Intravenous, Q6H PRN, [name], MD, 4 mg at 07/27/23 1138   Oral care mouth rinse, 15 mL, Mouth Rinse, PRN, [name], MD   oxyCODONE  (Oxy IR/ROXICODONE ) immediate release tablet 5-10 mg, 5-10 mg, Oral, Q3H PRN, Hendrickson, Steven C, MD, 5 mg at 07/28/23 1229   pantoprazole  (PROTONIX ) EC tablet 40 mg, 40 mg, Oral, Daily, Hendrickson, Steven C, MD, 40 mg at 07/28/23 0900   rosuvastatin  (CRESTOR ) tablet 40 mg, 40 mg, Oral, Daily, [name], MD, 40 mg at 07/28/23 0900   sodium chloride  flush (NS) 0.9 % injection 3 mL, 3 mL, Intravenous, Q12H, [name], MD, 3 mL at 07/28/23 0901   sodium chloride  flush (NS) 0.9 % injection 3 mL, 3 mL, Intravenous, PRN, [name], MD   temazepam  (RESTORIL ) capsule 30 mg, 30 mg, Oral, QHS PRN, Hendrickson, Steven C, MD, 30 mg at 07/27/23 2107  Patients Current Diet:  Diet Order             Diet Carb Modified Fluid consistency: Thin; Room service appropriate? No  Diet effective now                   Precautions / Restrictions Precautions Precautions: Sternal, Fall Precaution Booklet Issued: No Precaution/Restrictions Comments: cues to adhere Restrictions Weight Bearing Restrictions Per Provider Order: Yes RUE Weight Bearing Per Provider Order: Non weight bearing LUE Weight Bearing Per  Provider Order: Non weight bearing Other Position/Activity Restrictions: sternal   Has the patient had 2 or more falls or a fall with injury in the past year?Yes  Prior Activity Level Community (5-7x/wk): gets out of house daily; drives  Prior Functional Level Prior Function Prior Level of Function : Independent/Modified Independent, Driving (retired Education officer, environmental, Civil Service fast streamer; daughter concerned about pt's driving PTA) Mobility Comments: reports 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  Independent  Indoor Mobility: Did the patient need assistance with walking from room to room (with or without device)? Independent  Stairs: Did the patient need assistance with internal or external stairs (with or without device)? Independent  Functional Cognition: Did the patient need help planning regular tasks such as shopping or remembering to take medications? Independent  Patient Information Are you of Hispanic, Latino/a,or Spanish origin?: A. No, not of Hispanic, Latino/a, or Spanish origin What is your race?: A. White Do you need or want an interpreter to communicate with a doctor or health care staff?: 0. No  Patient's Response To:  Health Literacy and Transportation Is the patient able to respond to health literacy and transportation needs?: Yes Health Literacy - How often do you need to have someone help you when you read instructions, pamphlets, or other written material from your doctor or pharmacy?: Never In the past 12 months, has lack of transportation kept you from medical appointments or from getting medications?: No In the past 12 months, has lack of transportation kept you from meetings, work, or from getting things needed for daily living?: No  Home Assistive Devices / Equipment Home Equipment: Cane - single point  Prior Device Use: Indicate devices/aids used by the patient prior to  current illness, exacerbation or injury? cane  Current Functional Level Cognition  Orientation Level: Oriented X4    Extremity Assessment (includes Sensation/Coordination)  Upper Extremity Assessment: Right hand dominant  Lower Extremity Assessment: Defer to PT evaluation RLE Sensation: history of peripheral neuropathy LLE Sensation: history of peripheral neuropathy    ADLs  Overall ADL's : Needs assistance/impaired Grooming: Wash/dry hands, Wash/dry face, Oral care, Brushing hair, Set up, Sitting Upper Body Bathing: Minimal assistance, Sitting Lower Body Bathing: Moderate assistance, Sit to/from stand Upper Body Dressing : Moderate assistance, Sitting Lower Body Dressing: Moderate assistance, Sit to/from stand Lower Body Dressing Details (indicate cue type and reason): to change socks Toilet Transfer: Minimal assistance, Ambulation Toileting- Clothing Manipulation and Hygiene: Minimal assistance Functional mobility during ADLs: Minimal assistance, Rolling walker (2 wheels) General ADL Comments: education on sternal precautions with self care tasks    Mobility  Overal bed mobility: Needs Assistance Bed Mobility: Rolling, Sidelying to Sit Rolling: Contact guard assist Sidelying to sit: Min assist, HOB elevated Sit to sidelying: Min assist General bed mobility comments: assistance to raise trunk    Transfers  Overall transfer level: Needs assistance Equipment used: Rolling walker (2 wheels) Transfers: Sit to/from Stand Sit to Stand: Min assist General transfer comment: education on sternal precations with sit to stand and min assist to power up    Ambulation / Gait / Stairs / Wheelchair Mobility  Ambulation/Gait Ambulation/Gait assistance: Min assist, Contact guard assist Gait Distance (Feet): 470 Feet Assistive device: None Gait Pattern/deviations: Step-through pattern, Wide base of support, Decreased stride length General Gait Details: slight incr lateral sway/imbalance  multiple times, especailly with balance challenges/head turns Gait velocity: decr Gait velocity interpretation: <1.8 ft/sec, indicate of risk for recurrent falls    Posture / Balance Dynamic Sitting Balance Sitting balance - Comments: EOB Balance Overall balance assessment: Needs assistance Sitting-balance support: No upper extremity supported, Feet supported Sitting balance-Leahy Scale: Good Sitting balance - Comments: EOB Standing balance support: No upper extremity supported Standing balance-Leahy Scale: Fair Standing balance comment: standing at sink during self care tasks with CGA for safety    Special  needs/care consideration Skin Surgical Incision: leg; chest and Urostomy: RLQ     Previous Home Environment (from acute therapy documentation) Living Arrangements: Alone Available Help at Discharge: Family, Friend(s), Available PRN/intermittently Type of Home: Mobile home Home Layout: One level Home Access: Stairs to enter Entrance Stairs-Rails: Left Entrance Stairs-Number of Steps: 2 Bathroom Shower/Tub: Tub/shower unit, Engineer, building services: Standard Bathroom Accessibility: Yes How Accessible: Accessible via walker Home Care Services: No  Discharge Living Setting Plans for Discharge Living Setting: Patient's home Type of Home at Discharge: Mobile home Discharge Home Layout: One level Discharge Home Access: Stairs to enter Entrance Stairs-Rails: Left Entrance Stairs-Number of Steps: 2 Discharge Bathroom Shower/Tub: Tub/shower unit Discharge Bathroom Toilet: Standard Discharge Bathroom Accessibility: Yes How Accessible: Accessible via walker Does the patient have any problems obtaining your medications?: No  Social/Family/Support Systems Anticipated Caregiver: Isamu Trammel (brother), Cathren Coaster (son),Melissa Hildy Lowers (daughter) Anticipated Caregiver's Contact Information: Melissa: [phone] Flow: [phone], Tim: [phone] Caregiver Availability:  Intermittent Discharge Plan Discussed with Primary Caregiver: Yes Is Caregiver In Agreement with Plan?: Yes Does Caregiver/Family have Issues with Lodging/Transportation while Pt is in Rehab?: No   Goals Patient/Family Goal for Rehab: Mod I: PT/OT Expected length of stay: 7-10 days Pt/Family Agrees to Admission and willing to participate: Yes Program Orientation Provided & Reviewed with Pt/Caregiver Including Roles  & Responsibilities: Yes   Decrease burden of Care through IP rehab admission: NA   Possible need for SNF placement upon discharge:Not anticipated   Patient Condition: {PATIENT'S CONDITION:22832}  Preadmission Screen Completed By:  Amiel Kalata, CCC-SLP, 07/28/2023 1:04 PM ______________________________________________________________________   Discussed status with Dr. Aaron Aason***at *** and received approval for admission today.  Admission Coordinator:  Amiel Kalata, time***/Date***

## 2023-07-28 NOTE — Progress Notes (Signed)
 CARDIAC REHAB PHASE I   PRE:  Rate/Rhythm: 73 NSR  BP:  Sitting: 101/55      SpO2: 94 RA  MODE:  Ambulation: 470 ft    POST:  Rate/Rhythm: 85 NSR  BP:  Sitting: 123/59      SpO2: 95 RA  Pt ambulated with standby assistance in the hallway with RW, pt had some SOB but was able to return to room w/o stopping.   Pt received OHS book and education on restrictions, heart healthy diet, ex guidelines, Move in the Tube sheet, incentive spirometer use when d/c and CRPII. Pt denies questions and was encouraged to look in the book for additional information. Referral placed to [facility].   Pt interested in CR at [facility].    Barkley Li  MS, ACSM-CEP 11:43 AM 07/28/2023    Service time is from 1108 to 1143.

## 2023-07-28 NOTE — Care Management Important Message (Signed)
 Important Message  Patient Details  Name: Mark [name] MRN: [id number] Date of Birth: 1950/10/10   Important Message Given:  Yes - Medicare IM     [name] 07/28/2023, 2:06 PM

## 2023-07-29 DIAGNOSIS — I200 Unstable angina: Secondary | ICD-10-CM | POA: Diagnosis not present

## 2023-07-29 LAB — MAGNESIUM: Magnesium: 1.8 mg/dL (ref 1.7–2.4)

## 2023-07-29 MED ORDER — CLEVIDIPINE BUTYRATE 0.5 MG/ML IV EMUL
INTRAVENOUS | Status: AC
Start: 2023-07-29 — End: 2023-07-29
  Filled 2023-07-29: qty 50

## 2023-07-29 MED ORDER — MAGNESIUM SULFATE 2 GM/50ML IV SOLN
2.0000 g | Freq: Once | INTRAVENOUS | Status: AC
  Administered 2023-07-29: 2 g via INTRAVENOUS
  Filled 2023-07-29: qty 50

## 2023-07-29 MED ORDER — ACETAMINOPHEN 325 MG PO TABS: 650.0000 mg | ORAL_TABLET | Freq: Four times a day (QID) | ORAL | Status: AC | PRN

## 2023-07-29 NOTE — Progress Notes (Signed)
 Inpatient Rehab Admissions Coordinator:  Insurance authorization started yesterday. Await decision.   Artemus Larsen, MS, CCC-SLP Admissions Coordinator [phone]

## 2023-07-29 NOTE — Progress Notes (Signed)
                  [address]           [place], [place] [address]                     [phone]        7 Days Post-Op Procedure(s) (LRB): CORONARY ARTERY BYPASS GRAFTING X 2, USING LEFT INTERNAL MAMMARY ARTERY AND RIGHT ENDOSCOPIC HARVESTED GREATER SAPHENOUS VEIN (N/A) ECHOCARDIOGRAM, TRANSESOPHAGEAL, INTRAOPERATIVE (N/A)  Subjective: Patient has no specific complaint this am. He walked several times yesterday  Objective: Vital signs in last 24 hours: Temp:  [97.9 F (36.6 C)-98.7 F (37.1 C)] 98.7 F (37.1 C) (06/13 0310) Pulse Rate:  [63-80] 67 (06/13 0310) Cardiac Rhythm: Normal sinus rhythm;Bundle branch block;Heart block (06/12 1900) Resp:  [18-20] 19 (06/13 0310) BP: (101-123)/(55-67) 101/60 (06/13 0310) SpO2:  [95 %-99 %] 95 % (06/13 0310) Weight:  [74 kg] 74 kg (06/13 0500)  Pre op weight 74.4 kg Current Weight  07/29/23 74 kg       Intake/Output from previous day: 06/12 0701 - 06/13 0700 In: 360 [P.O.:360] Out: 500 [Urine:500]   Physical Exam:  Cardiovascular: RRR Pulmonary: Clear to auscultation bilaterally Abdomen: Soft, non tender, bowel sounds present. Extremities: No lower extremity edema. Wounds: Clean and dry.  No erythema or signs of infection.  Lab Results: CBC: Recent Labs    07/27/23 0313  WBC 9.5  HGB 9.2*  HCT 28.8*  PLT 120*   BMET:  Recent Labs    07/27/23 0313  NA 136  K 4.2  CL 107  CO2 20*  GLUCOSE 86  BUN 25*  CREATININE 1.33*  CALCIUM  8.4*    PT/INR:  Lab Results  Component Value Date   INR 1.8 (H) 07/22/2023   INR 1.3 (H) 07/21/2023   ABG:  INR: Will add last result for INR, ABG once components are confirmed Will add last 4 CBG results once components are confirmed  Assessment/Plan:  1. CV - History of atrial fibrillation. Maintaining SR, first degree heart block. On Amiodarone  400 mg bid, Midodrine  10 mg tid, and Plavix  75 mg daily. 2.  Pulmonary - On room air. CXR this am  appears stable (appears trace right apical pneumothorax resolved, atelectasis). Encourage incentive spirometer. 3.  Expected post op acute blood loss anemia - Last H and H stable at 9.2 and 28.8 4. History of CKD (stage III)-creatinine yesterday stable at 1.33 5. History of pre diabetes-CBGs 138/97/116. On Metformin  500 mg at breakfast as taken prior to surgery. Pre op HGA1C 6.1 6. Thrombocytopenia-platelets yesterday up to 120,000 7. Disposition-CIR vs SNF ;is ready when bed available  Adalena Abdulla M ZimmermanPA-C 6:56 AM

## 2023-07-29 NOTE — Progress Notes (Addendum)
 Rounding Note   Patient Name: Mark [name] Date of Encounter: 07/29/2023  [facility] Cardiologist: Peter Swaziland, MD   Subjective No chest pain, feels well. Recovering after sternotomy.   Maintaining sinus rhythm  Scheduled Meds:  amiodarone   400 mg Oral BID   aspirin  EC  81 mg Oral Daily   bisacodyl   10 mg Oral Daily   Or   bisacodyl   10 mg Rectal Daily   clopidogrel   75 mg Oral Daily   docusate sodium   200 mg Oral Daily   DULoxetine   30 mg Oral Daily   ezetimibe   10 mg Oral Daily   metFORMIN   500 mg Oral Q breakfast   midodrine   10 mg Oral TID WC   pantoprazole   40 mg Oral Daily   rosuvastatin   40 mg Oral Daily   sodium chloride  flush  3 mL Intravenous Q12H   Continuous Infusions:  magnesium  sulfate bolus IVPB     PRN Meds: acetaminophen , alum & mag hydroxide-simeth, magnesium  hydroxide, metoprolol  tartrate, ondansetron  (ZOFRAN ) IV, mouth rinse, oxyCODONE , sodium chloride  flush, temazepam    Vital Signs  Vitals:   07/29/23 0310 07/29/23 0500 07/29/23 0700 07/29/23 0801  BP: 101/60   108/61  Pulse: 67     Resp: 19  (!) 22 16  Temp: 98.7 F (37.1 C)   (!) 97.4 F (36.3 C)  TempSrc: Oral   Oral  SpO2: 95%   98%  Weight:  74 kg 76.3 kg   Height:        Intake/Output Summary (Last 24 hours) at 07/29/2023 1015 Last data filed at 07/28/2023 1210 Gross per 24 hour  Intake --  Output 500 ml  Net -500 ml      07/29/2023    7:00 AM 07/29/2023    5:00 AM 07/28/2023    3:14 AM  Last 3 Weights  Weight (lbs) 168 lb 3.4 oz 163 lb 2.3 oz 163 lb 8 oz  Weight (kg) 76.3 kg 74 kg 74.163 kg      Telemetry SR HR in the 60s - Personally Reviewed  ECG  No new tracings - Personally Reviewed  Physical Exam  GEN: No acute distress.   Neck: No JVD Cardiac: RRR, no murmurs, rubs, or gallops. Sternotomy C/D/I Respiratory: Clear to auscultation bilaterally. GI: Soft, nontender, non-distended  MS: No edema; No deformity. Neuro:  Nonfocal  Psych: Normal  affect  Urostomy in place    Labs High Sensitivity Troponin:   Recent Labs  Lab 07/16/23 1143 07/16/23 1345  TROPONINIHS 18* 23*     Chemistry Recent Labs  Lab 07/25/23 0330 07/26/23 0406 07/27/23 0313 07/28/23 0245 07/29/23 0316  NA 134* 137 136  --   --   K 3.9 4.3 4.2  --   --   CL 104 108 107  --   --   CO2 24 23 20*  --   --   GLUCOSE 138* 101* 86  --   --   BUN 17 23 25*  --   --   CREATININE 1.23 1.32* 1.33*  --   --   CALCIUM  8.1* 8.2* 8.4*  --   --   MG 3.2* 2.1 2.0 1.9 1.8  GFRNONAA >60 57* 56*  --   --   ANIONGAP 6 6 9   --   --     Lipids No results for input(s): CHOL, TRIG, HDL, LABVLDL, LDLCALC, CHOLHDL in the last 168 hours.  Hematology Recent Labs  Lab 07/25/23 0330 07/26/23  0406 07/27/23 0313  WBC 12.2* 10.5 9.5  RBC 3.01* 3.12* 3.25*  HGB 8.7* 8.9* 9.2*  HCT 26.4* 27.7* 28.8*  MCV 87.7 88.8 88.6  MCH 28.9 28.5 28.3  MCHC 33.0 32.1 31.9  RDW 14.4 14.8 15.1  PLT 74* 90* 120*   Thyroid No results for input(s): TSH, FREET4 in the last 168 hours.  BNPNo results for input(s): BNP, PROBNP in the last 168 hours.  DDimer No results for input(s): DDIMER in the last 168 hours.   Radiology  DG CHEST PORT 1 VIEW Result Date: 07/28/2023 CLINICAL DATA:  Status post cardiac surgery. EXAM: PORTABLE CHEST 1 VIEW COMPARISON:  07/26/2023 FINDINGS: Tiny left apical pneumothorax seen previously not evident on the current study. Left base collapse/consolidation with small effusion is similar to prior. Right lung remains clear. Cardiopericardial silhouette is at upper limits of normal for size. No acute bony abnormality. Telemetry leads overlie the chest. IMPRESSION: 1. Tiny left apical pneumothorax seen previously not evident on the current study. 2. Left base collapse/consolidation with small effusion, similar to prior. Electronically Signed   By: Donnal Fusi M.D.   On: 07/28/2023 08:08    Cardiac Studies  LHC 07/19/23:   Dist LM to Ost  LAD lesion is 65% stenosed.   1.  65% distal left main lesion with mild diffuse disease of the LAD and left circumflex. 2.  Moderate diffuse disease of the right coronary artery; a pressure wire assessment was performed to assess the hemodynamic significance of this burden of coronary atherosclerosis.  The RFR was 0.96 with a pressure wire position in the right posterolateral ventricular branch consistent with hemodynamically insignificant disease. 3.  LVEDP of 14 mmHg   Recommendation: Cardiothoracic surgical evaluation.  Results were reviewed with the patient's brother Tim via phone.  Patient Profile   73 y.o. male with hx of remote PAF not on OAC, bladder cancer with urostomy in place, chronic pain from neuropathy, GERD, colon cancer in remission, HTN, HLD, and now multivessel CAD s/p CABG.   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    For questions or updates, please contact [facility] Please consult [email or web address] for contact info under     Signed, Lamond Pilot, PA  07/29/2023, 10:15 AM    ATTENDING ATTESTATION:  After conducting a review of all available clinical information with the care team, interviewing the patient, and performing a physical exam, I agree with the findings and plan described in this note.   GEN: No acute distress.   HEENT:  MMM, no JVD, no scleral icterus Cardiac: RRR, no murmurs, rubs, or gallops.  Healing sternotomy. Respiratory: Clear to auscultation bilaterally. GI: Soft, nontender, non-distended  MS: No edema; No  deformity. Neuro:  Nonfocal  Vasc:  +2 radial pulses  Patient doing well and without cardiovascular complaints.  Plans to move to CIR.  No a candidate for SGLT2i due to UTI issues.  Contnue current therapy.  Will arrange Cardiology follow up.  Alyssa Backbone, MD Pager [phone]

## 2023-07-29 NOTE — Progress Notes (Signed)
 Physical Therapy Treatment Patient Details Name: Mark [name] MRN: [id number] DOB: [birth year] Today's Date: 07/29/2023   History of Present Illness 73 yo presented 07/16/23 due to chest pain. EKG and troponins not indicative of ischemia. CTA chest indicated moderate 3 vessel disease; 6/3 L heart cath; 6/6 CABGx2; post-op vasoplegia with hypotension    PT Comments  Pt resting in bed on arrival and agreeable to session with good progress towards acute goals. Pt cotnineus to demonstrate ambulation without need for AD support at grossly supervision level for safety. Pt able to perform step ups to 8 step with single UE support x10 on R and x5 on L for improved LE strength, increased functional independence and improved balance. Pt continues to benefit from skilled PT services to progress toward functional mobility goals.     If plan is discharge home, recommend the following: Assistance with cooking/housework;Assist for transportation;Help with stairs or ramp for entrance   Can travel by private vehicle        Equipment Recommendations  Other (comment)    Recommendations for Other Services       Precautions / Restrictions Precautions Precautions: Sternal;Fall Precaution Booklet Issued: No Recall of Precautions/Restrictions: Impaired Precaution/Restrictions Comments: cues to adhere Restrictions Weight Bearing Restrictions Per Provider Order: Yes Other Position/Activity Restrictions: sternal     Mobility  Bed Mobility Overal bed mobility: Needs Assistance Bed Mobility: Rolling, Sidelying to Sit Rolling: Contact guard assist Sidelying to sit: Min assist     Sit to sidelying: Contact guard assist General bed mobility comments: light assist to elevate trunk to sitting    Transfers Overall transfer level: Needs assistance Equipment used: None Transfers: Sit to/from Stand Sit to Stand: Contact guard assist           General transfer comment: CGA for safety good use of  pillow to brace chest    Ambulation/Gait Ambulation/Gait assistance: Supervision Gait Distance (Feet): 470 Feet Assistive device: None Gait Pattern/deviations: Step-through pattern, Wide base of support, Decreased stride length Gait velocity: decr     General Gait Details: no ovet LOB noted   Stairs Stairs: Yes Stairs assistance: Contact guard assist Stair Management: One rail Right, One rail Left, Step to pattern, Forwards Number of Stairs: 1 (x10 on R, x5 on L) General stair comments: stepping up/down single step in room x10 on R and x5 leading with L   Wheelchair Mobility     Tilt Bed    Modified Rankin (Stroke Patients Only)       Balance Overall balance assessment: Needs assistance Sitting-balance support: No upper extremity supported, Feet supported Sitting balance-Leahy Scale: Good Sitting balance - Comments: EOB   Standing balance support: No upper extremity supported Standing balance-Leahy Scale: Fair                              Hotel manager: No apparent difficulties  Cognition Arousal: Alert Behavior During Therapy: WFL for tasks assessed/performed                             Following commands: Intact      Cueing Cueing Techniques: Verbal cues  Exercises Other Exercises Other Exercises: step ups to 8 step with single UE support, x10 on R, x5 on L with seated rest between    General Comments General comments (skin integrity, edema, etc.): VSS on RA      Pertinent Vitals/Pain Pain  Assessment Pain Assessment: No/denies pain Pain Intervention(s): Monitored during session    Home Living                          Prior Function            PT Goals (current goals can now be found in the care plan section) Acute Rehab PT Goals PT Goal Formulation: With patient Time For Goal Achievement: 08/09/23 Progress towards PT goals: Progressing toward goals    Frequency    Min  3X/week      PT Plan      Co-evaluation              AM-PAC PT 6 Clicks Mobility   Outcome Measure  Help needed turning from your back to your side while in a flat bed without using bedrails?: A Little Help needed moving from lying on your back to sitting on the side of a flat bed without using bedrails?: A Little Help needed moving to and from a bed to a chair (including a wheelchair)?: A Little Help needed standing up from a chair using your arms (e.g., wheelchair or bedside chair)?: A Little Help needed to walk in hospital room?: A Little Help needed climbing 3-5 steps with a railing? : A Little 6 Click Score: 18    End of Session Equipment Utilized During Treatment: Gait belt Activity Tolerance: Patient tolerated treatment well Patient left: in bed;with call bell/phone within reach Nurse Communication: Mobility status PT Visit Diagnosis: Unsteadiness on feet (R26.81);Difficulty in walking, not elsewhere classified (R26.2)     Time: 1610-9604 PT Time Calculation (min) (ACUTE ONLY): 31 min  Charges:    $Gait Training: 8-22 mins $Therapeutic Exercise: 8-22 mins PT General Charges $$ ACUTE PT VISIT: 1 Visit                     Dianna Deshler R. PTA Acute Rehabilitation Services Office: [phone]   Agapito Horseman 07/29/2023, 12:28 PM

## 2023-07-29 NOTE — Progress Notes (Signed)
 CT sutures removed per order.  Steri strips applied.  Sites unremarkable.  WIll cont plan of care

## 2023-07-30 LAB — GLUCOSE, CAPILLARY: Glucose-Capillary: 151 mg/dL — ABNORMAL HIGH (ref 70–99)

## 2023-07-30 MED ORDER — NITROFURANTOIN MACROCRYSTAL 50 MG PO CAPS
100.0000 mg | ORAL_CAPSULE | Freq: Every day | ORAL | Status: DC
  Administered 2023-07-30 – 2023-08-03 (×5): 100 mg via ORAL
  Filled 2023-07-30 (×2): qty 1
  Filled 2023-07-30 (×3): qty 2
  Filled 2023-07-30 (×2): qty 1
  Filled 2023-07-30 (×2): qty 2

## 2023-07-30 MED ORDER — NITROFURANTOIN MACROCRYSTAL 100 MG PO CAPS
100.0000 mg | ORAL_CAPSULE | Freq: Every day | ORAL | Status: DC
  Filled 2023-07-30: qty 1

## 2023-07-30 NOTE — Progress Notes (Addendum)
 Inpatient Rehab Admissions Coordinator:  Received a denial from insurance. Pt made aware. TOC made aware. Attempted to contact pt's daughter [name]. No one answered. Left a message.   1141: Spoke to [employer]. Updated her regarding insurance denial.   Artemus Larsen, MS, CCC-SLP Admissions Coordinator [phone]

## 2023-07-30 NOTE — Progress Notes (Signed)
 Mobility Specialist Progress Note:    07/30/23 0907  Mobility  Activity Ambulated with assistance to bathroom;Ambulated with assistance in room  Level of Assistance Standby assist, set-up cues, supervision of patient - no hands on  Assistive Device Front wheel walker  Distance Ambulated (ft) 470 ft  RUE Weight Bearing Per Provider Order NWB  LUE Weight Bearing Per Provider Order NWB  Activity Response Tolerated well  Mobility Referral Yes  Mobility visit 1 Mobility  Mobility Specialist Start Time (ACUTE ONLY) 0857  Mobility Specialist Stop Time (ACUTE ONLY) 0905  Mobility Specialist Time Calculation (min) (ACUTE ONLY) 8 min   Pt received in bed, agreeable to mobility session. Ambulated in hallway with RW. SV for safety. Tolerated well, asx throughout. Returned pt to room, RN at bedside, all needs met, call bell in reach.   Ariston Grandison Mobility Specialist Please contact via Special educational needs teacher or  Rehab office at [phone]

## 2023-07-30 NOTE — Progress Notes (Signed)
 CARDIAC REHAB PHASE I   Postop [facility] education completed. No questions or concerns. Pending CIR approval for discharge. Referral for CRP2 sent to [facility]. Will continue to follow.   Ronny Colas, RN BSN 07/30/2023 9:51 AM

## 2023-07-30 NOTE — Progress Notes (Addendum)
      [address]       [place] [address]             [phone]      8 Days Post-Op Procedure(s) (LRB): CORONARY ARTERY BYPASS GRAFTING X 2, USING LEFT INTERNAL MAMMARY ARTERY AND RIGHT ENDOSCOPIC HARVESTED GREATER SAPHENOUS VEIN (N/A) ECHOCARDIOGRAM, TRANSESOPHAGEAL, INTRAOPERATIVE (N/A)  Subjective:  Patient doing well.  Does complain of some dysuria with urination.  He states that he has chronic longstanding history of frequent UTI's.  He does not believe he is receiving his antibiotics he takes daily at home.  He has moved his bowels since surgery, previous diarrhea has resolved.  Objective: Vital signs in last 24 hours: Temp:  [97.4 F (36.3 C)-98.4 F (36.9 C)] 98.4 F (36.9 C) (06/14 0319) Pulse Rate:  [72-82] 82 (06/14 0319) Cardiac Rhythm: Normal sinus rhythm;Bundle branch block (06/13 1902) Resp:  [16-20] 19 (06/14 0319) BP: (99-108)/(47-61) 102/55 (06/14 0319) SpO2:  [96 %-100 %] 96 % (06/14 0319) Weight:  [74.5 kg] 74.5 kg (06/14 0319)  Intake/Output from previous day: 06/13 0701 - 06/14 0700 In: 50 [IV Piggyback:50] Out: 2050 [Urine:2050]  General appearance: alert, cooperative, and no distress Heart: regular rate and rhythm Lungs: clear to auscultation bilaterally Abdomen: soft, non-tender; bowel sounds normal; no masses,  no organomegaly Extremities: extremities normal, atraumatic, no cyanosis or edema Wound: clean and dry  Lab Results: No results for input(s): WBC, HGB, HCT, PLT in the last 72 hours. BMET: No results for input(s): NA, K, CL, CO2, GLUCOSE, BUN, CREATININE, CALCIUM  in the last 72 hours.  PT/INR: No results for input(s): LABPROT, INR in the last 72 hours. ABG    Component Value Date/Time   PHART 7.338 (L) 07/22/2023 1926   HCO3 21.6 07/22/2023 1926   TCO2 23 07/22/2023 1926   ACIDBASEDEF 4.0 (H) 07/22/2023 1926   O2SAT 96 07/22/2023 1926   CBG (last 3)  Recent Labs    07/27/23 2127  07/28/23 0600 07/28/23 1203  GLUCAP 138* 97 116*    Assessment/Plan: S/P Procedure(s) (LRB): CORONARY ARTERY BYPASS GRAFTING X 2, USING LEFT INTERNAL MAMMARY ARTERY AND RIGHT ENDOSCOPIC HARVESTED GREATER SAPHENOUS VEIN (N/A) ECHOCARDIOGRAM, TRANSESOPHAGEAL, INTRAOPERATIVE (N/A)  CV- H/O A. Fib, maintaining NSR with 1st degree HB.. on Amiodarone  400 mg BID, BP is stable on Midodrine  10 mg TID and Plavix  Pulm- no acute issues, not requiring oxygen, continue IS GU- H/O chronic recurring UTIs, now with dysuria, will resume home Macrodantin Pre Diabetes- on Metformin  Deconditioning- limited due to chronic Neuropathy.Aaron Aas PT/OT recs CIR Dispo- patient stable, remains in NSR, resume home Macrodantin.. ready for d/c to CIR once insurance authorization comes through   LOS: 11 days    Gates Kasal, PA-C 07/30/2023   Chart reviewed, patient examined, agree with above.  He feels well overall. Awaiting decision about insurance auth for CIR. If BP ok tomorrow can start to decrease his Midodrine .

## 2023-07-30 NOTE — TOC Progression Note (Addendum)
 Transition of Care [facility]) - Initial/Assessment Note    Patient Details  Name: Mark [name] MRN: [id number] Date of Birth: 07-19-1950  Transition of Care [facility]) CM/SW Contact:    Maya Sparrow, LCSW Phone Number: 07/30/2023, 1:03 PM  Clinical Narrative:                 CSW contacted patient's daughter, [name].  The daughter is aware that insurance has denied CIR and requested that insurance authorization be submitted requesting SNF as she [place] and reports that it is not safe for patient to discharge home as pt lives alone and will not have adequate support.  CSW explained the insurance authorization process.  The facility choice is [facility].   Daughter is open to home health in the event that SNF authorization is denied.  CSW contacted Sherrlyn Dolores, admissions director at [facility], and LM informing that bed offer has been accepted and is awaiting a response to verify that bed is still available.    CSW requested that [facility] CMA request insurance authorization with facility pending.  Auth [id number]   TOC will continue to follow.  Expected Discharge Plan: IP Rehab Facility Barriers to Discharge: Continued Medical Work up   Patient Goals and CMS Choice Patient states their goals for this hospitalization and ongoing recovery are:: Patient's goal is for rehab and then return home after.          Expected Discharge Plan and Services In-house Referral: NA Discharge Planning Services: CM Consult Post Acute Care Choice: IP Rehab Living arrangements for the past 2 months: Single Family Home                   DME Agency: NA       HH Arranged: NA          Prior Living Arrangements/Services Living arrangements for the past 2 months: Single Family Home Lives with:: Self (Patient says he has support of son; however, he works during the day.) Patient language and need for interpreter reviewed:: Yes Do you feel safe going back to the place where you live?: Yes      Need  for Family Participation in Patient Care: Yes (Comment) Care giver support system in place?: No (comment)   Criminal Activity/Legal Involvement Pertinent to Current Situation/Hospitalization: No - Comment as needed  Activities of Daily Living   ADL Screening (condition at time of admission) Independently performs ADLs?: Yes (appropriate for developmental age) Is the patient deaf or have difficulty hearing?: No Does the patient have difficulty seeing, even when wearing glasses/contacts?: No Does the patient have difficulty concentrating, remembering, or making decisions?: No  Permission Sought/Granted Permission sought to share information with : Family Supports, Case Manager                Emotional Assessment Appearance:: Appears stated age Attitude/Demeanor/Rapport: Engaged Affect (typically observed): Appropriate Orientation: : Oriented to Self, Oriented to Place, Oriented to  Time, Oriented to Situation Alcohol / Substance Use: Not Applicable Psych Involvement: No (comment)  Admission diagnosis:  Elevated troponin [R79.89] Acute cystitis without hematuria [N30.00] Chest pain [R07.9] Chest pain, unspecified type [R07.9] 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] Patient Active Problem List   Diagnosis Date Noted   Non-ST elevation (NSTEMI) myocardial infarction (HCC) 07/22/2023   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   Unstable angina (HCC) 07/18/2023   Chest pain 07/16/2023   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   Deviated nasal septum 10/10/2013   Disorder  of kidney and ureter 10/10/2013   ED (erectile dysfunction) of organic origin 10/10/2013   Hearing loss 10/10/2013   Sensorineural hearing loss 10/10/2013   Hematuria 10/10/2013   Hypercholesteremia 10/10/2013   Hypertrophy of nasal turbinates 10/10/2013   Hypothyroidism 10/10/2013   Intestinal infection due to other organism, not elsewhere classified 10/10/2013   Lower urinary tract infectious disease 10/10/2013    Malignant neoplasm of urinary bladder (HCC) 10/10/2013   Tinnitus 10/10/2013   OTHER DYSPHAGIA 11/14/2007   GERD 11/10/2007   ADENOCARCINOMA, COLON, HX OF 11/10/2007   History of colonic polyps 11/10/2007   PCP:  Benedetto Brady, MD Pharmacy:   [id number] [place], [place] - [address] [place] [address] Phone: [phone] Fax: [phone]  [id number] - [place], [place] - [address] AT [facility] [address] [place] [address] Phone: [phone] Fax: [phone]  [id number] [place], [place] - [address] AT [facility] [address] & [facility] [address] [place] [address] Phone: [phone] Fax: [phone]  [facility][id number] [place], [place] - [address] AT [facility] OF [facility] & [address] [place] [address] Phone: [phone] Fax: [phone]     Social Drivers of Health (SDOH) Social History: SDOH Screenings   Food Insecurity: No Food Insecurity (07/18/2023)  Housing: Low Risk  (07/18/2023)  Transportation Needs: No Transportation Needs (07/18/2023)  Utilities: Not At Risk (07/18/2023)  Social Connections: Moderately Integrated (07/18/2023)  Tobacco Use: Low Risk  (07/22/2023)   SDOH Interventions:     Readmission Risk Interventions     No data to display

## 2023-07-31 LAB — GLUCOSE, CAPILLARY
Glucose-Capillary: 116 mg/dL — ABNORMAL HIGH (ref 70–99)
Glucose-Capillary: 137 mg/dL — ABNORMAL HIGH (ref 70–99)
Glucose-Capillary: 157 mg/dL — ABNORMAL HIGH (ref 70–99)
Glucose-Capillary: 150 mg/dL — ABNORMAL HIGH (ref 70–99)

## 2023-07-31 MED ORDER — MIDODRINE HCL 5 MG PO TABS
5.0000 mg | ORAL_TABLET | Freq: Three times a day (TID) | ORAL | Status: DC
  Administered 2023-07-31 – 2023-08-03 (×10): 5 mg via ORAL
  Filled 2023-07-31 (×10): qty 1

## 2023-07-31 NOTE — Progress Notes (Signed)
 Mobility Specialist Progress Note:    07/31/23 0902  Mobility  Activity Ambulated with assistance in room;Ambulated with assistance in hallway  Level of Assistance Minimal assist, patient does 75% or more  Assistive Device Front wheel walker  Distance Ambulated (ft) 470 ft  RUE Weight Bearing Per Provider Order NWB  LUE Weight Bearing Per Provider Order NWB  Activity Response Tolerated well  Mobility Referral Yes  Mobility visit 1 Mobility  Mobility Specialist Start Time (ACUTE ONLY) 0902  Mobility Specialist Stop Time (ACUTE ONLY) 0911  Mobility Specialist Time Calculation (min) (ACUTE ONLY) 9 min   Pt received in bed, agreeable to mobility session. MinA to sit EOB, SV during ambulation. Tolerated well, asx throughout. Returned pt to room, lying comfortably in bed with all needs met.   Domanic Matusek Mobility Specialist Please contact via Special educational needs teacher or  Rehab office at [phone]

## 2023-07-31 NOTE — Progress Notes (Addendum)
      [address]       [place] [address]             [phone]      9 Days Post-Op Procedure(s) (LRB): CORONARY ARTERY BYPASS GRAFTING X 2, USING LEFT INTERNAL MAMMARY ARTERY AND RIGHT ENDOSCOPIC HARVESTED GREATER SAPHENOUS VEIN (N/A) ECHOCARDIOGRAM, TRANSESOPHAGEAL, INTRAOPERATIVE (N/A)  Subjective:  Patient continues to do well.  He states his urinary symptoms improved after resumption of home antibiotic.    Objective: Vital signs in last 24 hours: Temp:  [97.7 F (36.5 C)-98.4 F (36.9 C)] 97.9 F (36.6 C) (06/15 0713) Pulse Rate:  [64-96] 66 (06/15 0713) Cardiac Rhythm: Normal sinus rhythm;Bundle branch block (06/14 2100) Resp:  [18-20] 20 (06/15 0713) BP: (104-146)/(57-63) 110/62 (06/15 0713) SpO2:  [95 %-100 %] 99 % (06/15 0713) Weight:  [74.9 kg] 74.9 kg (06/15 0459)  Intake/Output from previous day: 06/14 0701 - 06/15 0700 In: 240 [P.O.:240] Out: 1550 [Urine:1550]  General appearance: alert, cooperative, and no distress Heart: regular rate and rhythm Lungs: clear to auscultation bilaterally Abdomen: soft, non-tender; bowel sounds normal; no masses,  no organomegaly Extremities: extremities normal, atraumatic, no cyanosis or edema Wound: clean and dry  Lab Results: No results for input(s): WBC, HGB, HCT, PLT in the last 72 hours. BMET: No results for input(s): NA, K, CL, CO2, GLUCOSE, BUN, CREATININE, CALCIUM  in the last 72 hours.  PT/INR: No results for input(s): LABPROT, INR in the last 72 hours. ABG    Component Value Date/Time   PHART 7.338 (L) 07/22/2023 1926   HCO3 21.6 07/22/2023 1926   TCO2 23 07/22/2023 1926   ACIDBASEDEF 4.0 (H) 07/22/2023 1926   O2SAT 96 07/22/2023 1926   CBG (last 3)  Recent Labs    07/28/23 1203 07/30/23 2106 07/31/23 0622  GLUCAP 116* 151* 116*    Assessment/Plan: S/P Procedure(s) (LRB): CORONARY ARTERY BYPASS GRAFTING X 2, USING LEFT INTERNAL MAMMARY ARTERY AND  RIGHT ENDOSCOPIC HARVESTED GREATER SAPHENOUS VEIN (N/A) ECHOCARDIOGRAM, TRANSESOPHAGEAL, INTRAOPERATIVE (N/A)  CV- H/O A. Fib, maintaining NSR, BP is ranging 100-140s- will decrease Midodrine  to 5 mg TID and see how he tolerates Pulm- not acute issues, off oxygen, continue IS GU- H/O chronic recurring UTIs- resumed home Macrodantin with improvement in symptoms Pre-Diabetes on Metformin  Chronic Pain- due to Neuropathy, on home pain regimen Deconditioning- PT/OT recs CIR, insurance has denied stay, but will approve SNF.Aaron Aas he may have a bed at [facility]... await confirmation can hopefully d/c tomorrow   LOS: 12 days    Gates Kasal, PA-C 07/31/2023   Chart reviewed, patient examined, agree with above.  He feels well. BP stable. Midodrine  decreased to 5 tid. Plan is for SNF at discharge, possibly tomorrow if bed available.

## 2023-08-01 LAB — GLUCOSE, CAPILLARY: Glucose-Capillary: 104 mg/dL — ABNORMAL HIGH (ref 70–99)

## 2023-08-01 MED ORDER — AMIODARONE HCL 200 MG PO TABS
200.0000 mg | ORAL_TABLET | Freq: Two times a day (BID) | ORAL | Status: DC
  Administered 2023-08-01 – 2023-08-03 (×5): 200 mg via ORAL
  Filled 2023-08-01 (×5): qty 1

## 2023-08-01 MED ORDER — MIDODRINE HCL 5 MG PO TABS
5.0000 mg | ORAL_TABLET | Freq: Three times a day (TID) | ORAL | Status: DC
Start: 2023-08-01 — End: 2023-08-03

## 2023-08-01 MED ORDER — CLEVIDIPINE BUTYRATE 0.5 MG/ML IV EMUL
INTRAVENOUS | Status: AC
  Filled 2023-08-01: qty 50

## 2023-08-01 MED ORDER — AMIODARONE HCL 200 MG PO TABS: ORAL_TABLET | ORAL | Status: DC

## 2023-08-01 NOTE — Progress Notes (Signed)
 Occupational Therapy Treatment Patient Details Name: Mark [name] MRN: [id number] DOB: 1951/01/17 Today's Date: 08/01/2023   History of present illness 73 yo presented 07/16/23 due to chest pain. EKG and troponins not indicative of ischemia. 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 Recommended seated showering. Pt mobilized to bathroom and sink with supervision and no AD. Improved ability to sequence and perform bed mobility adhering to sternal precautions. Instructed in sit to stand with hands on knees. Pt has lined up assistance of family and friends at home intermittently once he completes rehab. Patient will benefit from continued inpatient follow up therapy, <3 hours/day.      If plan is discharge home, recommend the following:  A little help with walking and/or transfers;A little help with bathing/dressing/bathroom;Assistance with cooking/housework;Direct supervision/assist for medications management;Assist for transportation;Help with stairs or ramp for entrance   Equipment Recommendations  Other (comment) (defer to next venue)    Recommendations for Other Services      Precautions / Restrictions Precautions Precautions: Sternal;Fall Recall of Precautions/Restrictions: Intact Precaution/Restrictions Comments: adheres to sternal precautions Restrictions Weight Bearing Restrictions Per Provider Order: No       Mobility Bed Mobility Overal bed mobility: Needs Assistance Bed Mobility: Rolling, Sidelying to Sit Rolling: Contact guard assist Sidelying to sit: Contact guard assist     Sit to sidelying: Contact guard assist General bed mobility comments: CGA for safety    Transfers Overall transfer level: Needs assistance Equipment used: None Transfers: Sit to/from Stand Sit to Stand: Contact guard  assist           General transfer comment: hands on knees     Balance Overall balance assessment: Needs assistance   Sitting balance-Leahy Scale: Good     Standing balance support: No upper extremity supported Standing balance-Leahy Scale: Fair                             ADL either performed or assessed with clinical judgement   ADL Overall ADL's : Needs assistance/impaired     Grooming: Wash/dry hands;Standing;Supervision/safety           Upper Body Dressing : Set up;Sitting   Lower Body Dressing: Set up;Sitting/lateral leans   Toilet Transfer: Supervision/safety;Ambulation   Toileting- Clothing Manipulation and Hygiene: Supervision/safety       Functional mobility during ADLs: Supervision/safety General ADL Comments: Educated on IADLs to avoid when he returns home. Reports family and friends will take turns visiting daily to assist with errands and heavy work.    Extremity/Trunk Assessment              Vision       Restaurant manager, fast food Communication: No apparent difficulties   Cognition Arousal: Alert Behavior During Therapy: WFL for tasks assessed/performed Cognition: No apparent impairments                               Following commands: Intact        Cueing   Cueing Techniques: Verbal cues  Exercises      Shoulder Instructions       General Comments VSS on RA, issued handout for energy conservation techniques and reviewed with pt  Pertinent Vitals/ Pain       Pain Assessment Pain Assessment: Faces Faces Pain Scale: Hurts a little bit Pain Location: chest Pain Descriptors / Indicators: Sore Pain Intervention(s): Monitored during session  Home Living                                          Prior Functioning/Environment              Frequency  Min 2X/week        Progress Toward Goals  OT Goals(current goals can now be found in  the care plan section)  Progress towards OT goals: Progressing toward goals  Acute Rehab OT Goals OT Goal Formulation: With patient Time For Goal Achievement: 08/09/23 Potential to Achieve Goals: Good  Plan      Co-evaluation                 AM-PAC OT 6 Clicks Daily Activity     Outcome Measure   Help from another person eating meals?: None Help from another person taking care of personal grooming?: A Little Help from another person toileting, which includes using toliet, bedpan, or urinal?: A Little Help from another person bathing (including washing, rinsing, drying)?: A Little Help from another person to put on and taking off regular upper body clothing?: A Little Help from another person to put on and taking off regular lower body clothing?: A Little 6 Click Score: 19    End of Session Equipment Utilized During Treatment: Gait belt  OT Visit Diagnosis: Unsteadiness on feet (R26.81);Other abnormalities of gait and mobility (R26.89);Muscle weakness (generalized) (M62.81)   Activity Tolerance Patient tolerated treatment well   Patient Left in bed;with call bell/phone within reach   Nurse Communication          Time: 5409-8119 OT Time Calculation (min): 30 min  Charges: OT General Charges $OT Visit: 1 Visit OT Treatments $Self Care/Home Management : 23-37 mins  Avanell Leigh, OTR/L Acute Rehabilitation Services Office: [phone]   Jonette Nestle 08/01/2023, 4:38 PM

## 2023-08-01 NOTE — TOC Progression Note (Addendum)
 Transition of Care [facility]) - Progression Note    Patient Details  Name: Mark [name] MRN: [id number] Date of Birth: May 10, 1950  [facility] CM/SW Contact  Carmon Christen, LCSWA Phone Number: 08/01/2023, 11:31 AM  Clinical Narrative:     CSW spoke with [facility] with [facility] who confirmed SNF bed for patient. Patient has SNF bed at [facility]. CSW informed CMA Shelvy Dickens who is working on English as a second language teacher for patient. Patients insurance authorization currently pending. CSW will continue to follow.   Expected Discharge Plan: IP Rehab Facility Barriers to Discharge: Continued Medical Work up  Expected Discharge Plan and Services In-house Referral: NA Discharge Planning Services: CM Consult Post Acute Care Choice: IP Rehab Living arrangements for the past 2 months: Single Family Home                   DME Agency: NA       HH Arranged: NA           Social Determinants of Health (SDOH) Interventions SDOH Screenings   Food Insecurity: No Food Insecurity (07/18/2023)  Housing: Low Risk  (07/18/2023)  Transportation Needs: No Transportation Needs (07/18/2023)  Utilities: Not At Risk (07/18/2023)  Social Connections: Moderately Integrated (07/18/2023)  Tobacco Use: Low Risk  (07/22/2023)    Readmission Risk Interventions     No data to display

## 2023-08-01 NOTE — Progress Notes (Signed)
 Physical Therapy Treatment Patient Details Name: Mark [name] MRN: [id number] DOB: 1950/10/04 Today's Date: 08/01/2023   History of Present Illness 73 yo presented 07/16/23 due to chest pain. EKG and troponins not indicative of ischemia. 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 Session focused on education of energy conservation techniques and self pacing. Pt demonstrating gait at supervision level without AD support with good self pacing implemented this session with cues as pt with decreased DOE, 1/4, compared with prior sessions. Handout provided and reviewed with energy conservation techniques with pt verbalizing understanding. Pt continues to benefit from skilled PT services to progress toward functional mobility goals.     If plan is discharge home, recommend the following: Assistance with cooking/housework;Assist for transportation;Help with stairs or ramp for entrance   Can travel by private vehicle        Equipment Recommendations  Other (comment)    Recommendations for Other Services       Precautions / Restrictions Precautions Precautions: Sternal;Fall Precaution Booklet Issued: No Recall of Precautions/Restrictions: Intact Restrictions Weight Bearing Restrictions Per Provider Order: Yes Other Position/Activity Restrictions: sternal     Mobility  Bed Mobility Overal bed mobility: Needs Assistance Bed Mobility: Rolling, Sidelying to Sit Rolling: Contact guard assist Sidelying to sit: Contact guard assist     Sit to sidelying: Contact guard assist General bed mobility comments: CGA for safety    Transfers Overall transfer level: Needs assistance Equipment used: None Transfers: Sit to/from Stand Sit to Stand: Contact guard assist           General transfer comment: CGA  for safety good use of pillow to brace chest    Ambulation/Gait Ambulation/Gait assistance: Supervision Gait Distance (Feet): 500 Feet Assistive device: None Gait Pattern/deviations: Step-through pattern, Wide base of support, Decreased stride length Gait velocity: decr     General Gait Details: improved self pacing with no LOB   Stairs             Wheelchair Mobility     Tilt Bed    Modified Rankin (Stroke Patients Only)       Balance Overall balance assessment: Needs assistance Sitting-balance support: No upper extremity supported, Feet supported Sitting balance-Leahy Scale: Good Sitting balance - Comments: EOB   Standing balance support: No upper extremity supported Standing balance-Leahy Scale: Fair Standing balance comment: standing at sink during self care tasks with CGA for safety                            Communication Communication Communication: No apparent difficulties  Cognition Arousal: Alert Behavior During Therapy: WFL for tasks assessed/performed   PT - Cognitive impairments: No apparent impairments                         Following commands: Intact      Cueing Cueing Techniques: Verbal cues  Exercises      General Comments General comments (skin integrity, edema, etc.): VSS on RA, issued handout for energy conservation techniques and reviewed with pt      Pertinent Vitals/Pain Pain Assessment Pain Assessment: No/denies pain    Home Living  Prior Function            PT Goals (current goals can now be found in the care plan section) Acute Rehab PT Goals PT Goal Formulation: With patient Time For Goal Achievement: 08/09/23 Progress towards PT goals: Progressing toward goals    Frequency    Min 3X/week      PT Plan      Co-evaluation              AM-PAC PT 6 Clicks Mobility   Outcome Measure  Help needed turning from your back to your side while  in a flat bed without using bedrails?: A Little Help needed moving from lying on your back to sitting on the side of a flat bed without using bedrails?: A Little Help needed moving to and from a bed to a chair (including a wheelchair)?: A Little Help needed standing up from a chair using your arms (e.g., wheelchair or bedside chair)?: A Little Help needed to walk in hospital room?: A Little Help needed climbing 3-5 steps with a railing? : A Little 6 Click Score: 18    End of Session   Activity Tolerance: Patient tolerated treatment well Patient left: in bed;with call bell/phone within reach Nurse Communication: Mobility status PT Visit Diagnosis: Unsteadiness on feet (R26.81);Difficulty in walking, not elsewhere classified (R26.2)     Time: 7829-5621 PT Time Calculation (min) (ACUTE ONLY): 16 min  Charges:    $Gait Training: 8-22 mins PT General Charges $$ ACUTE PT VISIT: 1 Visit                     Mark [name] R. PTA Acute Rehabilitation Services Office: [phone]   Mark [name] 08/01/2023, 4:15 PM

## 2023-08-01 NOTE — Care Management Important Message (Signed)
 Important Message  Patient Details  Name: Mark [name] MRN: [id number] Date of Birth: [birth year]   Important Message Given:  Yes - Medicare IM     Janith Melnick 08/01/2023, 11:40 AM

## 2023-08-01 NOTE — Progress Notes (Addendum)
      [address]       [place] [address]             [phone]      10 Days Post-Op Procedure(s) (LRB): CORONARY ARTERY BYPASS GRAFTING X 2, USING LEFT INTERNAL MAMMARY ARTERY AND RIGHT ENDOSCOPIC HARVESTED GREATER SAPHENOUS VEIN (N/A) ECHOCARDIOGRAM, TRANSESOPHAGEAL, INTRAOPERATIVE (N/A) Subjective: Patient without complaints this AM  Objective: Vital signs in last 24 hours: Temp:  [97.8 F (36.6 C)-98.5 F (36.9 C)] 98.5 F (36.9 C) (06/16 0455) Pulse Rate:  [66-75] 74 (06/16 0455) Cardiac Rhythm: Normal sinus rhythm (06/15 2100) Resp:  [15-20] 15 (06/16 0455) BP: (100-122)/(60-65) 100/65 (06/16 0455) SpO2:  [95 %-99 %] 96 % (06/16 0455) Weight:  [73.1 kg] 73.1 kg (06/16 0501)  Hemodynamic parameters for last 24 hours:    Intake/Output from previous day: 06/15 0701 - 06/16 0700 In: 360 [P.O.:360] Out: 1700 [Urine:1700] Intake/Output this shift: No intake/output data recorded.  General appearance: alert, cooperative, and no distress Neurologic: intact Heart: regular rate and rhythm, S1, S2 normal, no murmur, click, rub or gallop Lungs: clear to auscultation bilaterally Abdomen: soft, non-tender; bowel sounds normal; no masses,  no organomegaly Extremities: extremities normal, atraumatic, no cyanosis or edema Wound: Clean and dry without sign of infection  Lab Results: No results for input(s): WBC, HGB, HCT, PLT in the last 72 hours. BMET: No results for input(s): NA, K, CL, CO2, GLUCOSE, BUN, CREATININE, CALCIUM  in the last 72 hours.  PT/INR: No results for input(s): LABPROT, INR in the last 72 hours. ABG    Component Value Date/Time   PHART 7.338 (L) 07/22/2023 1926   HCO3 21.6 07/22/2023 1926   TCO2 23 07/22/2023 1926   ACIDBASEDEF 4.0 (H) 07/22/2023 1926   O2SAT 96 07/22/2023 1926   CBG (last 3)  Recent Labs    07/31/23 1655 07/31/23 2122 08/01/23 0609  GLUCAP 157* 150* 104*     Assessment/Plan: S/P Procedure(s) (LRB): CORONARY ARTERY BYPASS GRAFTING X 2, USING LEFT INTERNAL MAMMARY ARTERY AND RIGHT ENDOSCOPIC HARVESTED GREATER SAPHENOUS VEIN (N/A) ECHOCARDIOGRAM, TRANSESOPHAGEAL, INTRAOPERATIVE (N/A)  Cardio: Hx of postop afib, on Amiodarone  400mg  BID since 06/09. Will decrease to 200mg  BID. SBP 100-122 On Midodrine  5mg  TID. NSR, HR 60s. 1st degree AV block. Hx of unstable angina/possible NSTEMI, on Plavix .  Pulm: Saturating well on RA, encourage IS and ambulation.  GI: Tolerating a diet. +BM yesterday  Endo: Prediabetes, on home Metformin . SSI and CBGs have been discontinued.   Renal: Hx of chronic UTIs, on home Macrodantin. Under preop weight.   Expected postop thrombocytopenia: Improved on 06/11, plt 120,000.  DVT Prophylaxis: Lovenox  held due to thrombocytopenia  Dispo: Insurance denied CIR, hopefully can d/c to SNF today.    LOS: 13 days    Angela Barban, PA-C 08/01/2023  Patient seen and examined, agree with above  Landon Pinion C. Luna Salinas, MD [facility] [phone]

## 2023-08-01 NOTE — Progress Notes (Signed)
 CARDIAC REHAB PHASE I   Postop OHS education completed. Referral for CRP2 sent to [facility]. Pending discharge to SNF.   Ronny Colas, RN BSN 08/01/2023 1:07 PM

## 2023-08-02 NOTE — Progress Notes (Signed)
                  [address]           [place], [place] [address]                     [phone]        11 Days Post-Op Procedure(s) (LRB): CORONARY ARTERY BYPASS GRAFTING X 2, USING LEFT INTERNAL MAMMARY ARTERY AND RIGHT ENDOSCOPIC HARVESTED GREATER SAPHENOUS VEIN (N/A) ECHOCARDIOGRAM, TRANSESOPHAGEAL, INTRAOPERATIVE (N/A)  Subjective: Patient resting this am and awakened. He has no complaints this am. He hopes to go to SNF  Objective: Vital signs in last 24 hours: Temp:  [98 F (36.7 C)-98.3 F (36.8 C)] 98.1 F (36.7 C) (06/17 0354) Pulse Rate:  [68-72] 70 (06/17 0354) Cardiac Rhythm: Heart block (06/16 1935) Resp:  [14-20] 19 (06/17 0542) BP: (103-121)/(54-65) 109/54 (06/17 0354) SpO2:  [95 %-96 %] 95 % (06/17 0354) Weight:  [73.3 kg] 73.3 kg (06/17 0542)  Pre op weight 74.4 kg Current Weight  08/02/23 73.3 kg      Intake/Output from previous day: 06/16 0701 - 06/17 0700 In: 480 [P.O.:480] Out: 800 [Urine:800]   Physical Exam:  Cardiovascular: RRR Pulmonary: Clear to auscultation bilaterally Abdomen: Soft, non tender, bowel sounds present. Extremities: No lower extremity edema. Wounds: Clean and dry.  No erythema or signs of infection.  Lab Results: CBC:No results for input(s): WBC, HGB, HCT, PLT in the last 72 hours. BMET: No results for input(s): NA, K, CL, CO2, GLUCOSE, BUN, CREATININE, CALCIUM  in the last 72 hours.  PT/INR:  Lab Results  Component Value Date   INR 1.8 (H) 07/22/2023   INR 1.3 (H) 07/21/2023   ABG:  INR: Will add last result for INR, ABG once components are confirmed Will add last 4 CBG results once components are confirmed  Assessment/Plan:  1. CV - History of atrial fibrillation. Maintaining SR, first degree heart block. On Amiodarone  200 mg bid, Midodrine  5 mg tid, and Plavix  75 mg daily. 2.  Pulmonary - On room air. CXR this am appears stable (appears trace right apical  pneumothorax resolved, atelectasis). Encourage incentive spirometer. 3.  Expected post op acute blood loss anemia - Last H and H stable at 9.2 and 28.8 4. History of CKD (stage III)-Last creatinine  stable at 1.33 5. History of pre diabetes-CBGs 157/150/104. On Metformin  500 mg at breakfast as taken prior to surgery. Pre op HGA1C 6.1 6. Thrombocytopenia-Last platelets up to 120,000 7. Disposition- to SNF once insurance authorization/bed available  Payam Gribble M ZimmermanPA-C 7:20 AM

## 2023-08-02 NOTE — TOC CM/SW Note (Signed)
    Durable Medical Equipment  (From admission, onward)           Start     Ordered   08/02/23 1408  For home use only DME 4 wheeled rolling walker with seat  Once       Question:  Patient needs a walker to treat with the following condition  Answer:  Physical deconditioning   08/02/23 1408

## 2023-08-02 NOTE — TOC Progression Note (Signed)
 Transition of Care [facility]) - Progression Note    Patient Details  Name: Mark [name] MRN: [id number] Date of Birth: 03/11/1950  Transition [facility] Care [facility]) CM/SW Contact  Valery Gaucher, [place] Phone Number: 08/02/2023, 11:39 AM  Clinical Narrative:  Received call back from PA- appeal was completed and denied     Informed patient's daughter insurance denied short term rehab at [facility]- she interested in appealing decision- CSW called her back, left voice message of [employer] Appeal # [phone]. Requested she call back if  appeal was completed.   TOC will continue to follow and assist with discharge planning.   Liddie Reel, MSW, LCSW Clinical Social Worker    Expected Discharge Plan: IP Rehab Facility Barriers to Discharge: Continued Medical Work up  Expected Discharge Plan and Services In-house Referral: NA Discharge Planning Services: CM Consult Post Acute Care Choice: IP Rehab Living arrangements for the past 2 months: Single Family Home                   DME Agency: NA       HH Arranged: NA           Social Determinants of Health (SDOH) Interventions SDOH Screenings   Food Insecurity: No Food Insecurity (07/18/2023)  Housing: Low Risk  (07/18/2023)  Transportation Needs: No Transportation Needs (07/18/2023)  Utilities: Not At Risk (07/18/2023)  Social Connections: Moderately Integrated (07/18/2023)  Tobacco Use: Low Risk  (07/22/2023)    Readmission Risk Interventions     No data to display

## 2023-08-02 NOTE — TOC Progression Note (Signed)
 Transition of Care [facility]) - Progression Note    Patient Details  Name: Mark [name] MRN: [id number] Date of Birth: March 20, 1950  Transition of Care [facility] CM/SW Contact  Graves-Bigelow, Jari Merles, RN Phone Number: 08/02/2023, 2:52 PM  Clinical Narrative: Case Manager received a message from the CSW that family wants the patient to go home in [place] with son-address is [address] in [place], [place] [address]. Case Manager spoke with daughter Mark [name] and she is agreeable to [facility] since they have staffing ability. Referral submitted to [facility] and start of care to begin within 24-48 hours post transition home. Mark [name] is aware to contact the patients daughter in law- [phone] and the second contact is Mark [name] [phone]. DME referral submitted to [employer] since patient has HMO plan. DME to be delivered to the room this evening. Patient will have transportation home via son and or daughter-in-law. No further needs identified at this time.    Expected Discharge Plan: Home w Home Health Services Barriers to Discharge: No Barriers Identified  Expected Discharge Plan and Services In-house Referral: NA Discharge Planning Services: CM Consult Post Acute Care Choice: IP Rehab Living arrangements for the past 2 months: Single Family Home                 DME Arranged: Walker rolling with seat DME Agency: [employer] Date DME Agency Contacted: 08/02/23 Time DME Agency Contacted: 41 Representative spoke with at DME Agency: Zack HH Arranged: PT, OT HH Agency: [facility] Date [facility] Agency Contacted: 08/02/23 Time HH Agency Contacted: 1451 Representative spoke with at [facility] Agency: Randel Buss  Social Determinants of Health (SDOH) Interventions SDOH Screenings   Food Insecurity: No Food Insecurity (07/18/2023)  Housing: Low Risk  (07/18/2023)  Transportation Needs: No Transportation Needs (07/18/2023)  Utilities: Not At Risk (07/18/2023)  Social Connections: Moderately Integrated (07/18/2023)   Tobacco Use: Low Risk  (07/22/2023)    Readmission Risk Interventions     No data to display

## 2023-08-02 NOTE — TOC Progression Note (Signed)
 Transition of Care [facility]) - Progression Note    Patient Details  Name: Mark [name] MRN: [id number] Date of Birth: 08/31/1950  Transition of Care [facility]) CM/SW Contact  Valery Gaucher, [place] Phone Number: 08/02/2023, 11:18 AM  Clinical Narrative:     P2P  requested # [phone] option 5 to be completed by 1pm- subscriber #  [id number]  PA updated   TOC awaiting decision   Liddie Reel, MSW, LCSW Clinical Social Worker    Expected Discharge Plan: IP Rehab Facility Barriers to Discharge: Continued Medical Work up  Expected Discharge Plan and Services In-house Referral: NA Discharge Planning Services: CM Consult Post Acute Care Choice: IP Rehab Living arrangements for the past 2 months: Single Family Home                   DME Agency: NA       HH Arranged: NA           Social Determinants of Health (SDOH) Interventions SDOH Screenings   Food Insecurity: No Food Insecurity (07/18/2023)  Housing: Low Risk  (07/18/2023)  Transportation Needs: No Transportation Needs (07/18/2023)  Utilities: Not At Risk (07/18/2023)  Social Connections: Moderately Integrated (07/18/2023)  Tobacco Use: Low Risk  (07/22/2023)    Readmission Risk Interventions     No data to display

## 2023-08-02 NOTE — Progress Notes (Signed)
 6/17 After the Peer to Peer was conducted, the patient was denied. [employer] Appeal#[phone] Fax#[phone]

## 2023-08-02 NOTE — TOC Progression Note (Signed)
 Transition of Care [facility]) - Progression Note    Patient Details  Name: KARNELL VANDERLOOP MRN: [id number] Date of Birth: 1950/03/22  Transition of Care [facility]) CM/SW Contact  Valery Gaucher, [place] Phone Number: 08/02/2023, 1:23 PM  Clinical Narrative:      Received call from patient's daughter, [name]. She confirmed the plan is for the patient to d/c tomorrow  w/ HH to his son's home at [address] in [place], [place].  Family requested a rolling walker w/seat.    TOC will continue to follow and assist with discharge planning.   Liddie Reel, MSW, LCSW Clinical Social Worker   Expected Discharge Plan: IP Rehab Facility Barriers to Discharge: Continued Medical Work up  Expected Discharge Plan and Services In-house Referral: NA Discharge Planning Services: CM Consult Post Acute Care Choice: IP Rehab Living arrangements for the past 2 months: Single Family Home                   DME Agency: NA       HH Arranged: NA           Social Determinants of Health (SDOH) Interventions SDOH Screenings   Food Insecurity: No Food Insecurity (07/18/2023)  Housing: Low Risk  (07/18/2023)  Transportation Needs: No Transportation Needs (07/18/2023)  Utilities: Not At Risk (07/18/2023)  Social Connections: Moderately Integrated (07/18/2023)  Tobacco Use: Low Risk  (07/22/2023)    Readmission Risk Interventions     No data to display

## 2023-08-02 NOTE — Progress Notes (Signed)
      [address]       [name] [address]             [phone]      Patient's insurance denied SNF, he is discussing with his daughter and son options to go home with his son and son's wife who works from home. Patient's family will need a night to make arrangements now that insurance has denied SNF. Plan for discharge in the morning with home health PT/OT with son and son's wife.   Angela Barban, PA-C

## 2023-08-02 NOTE — Progress Notes (Signed)
 Mobility Specialist Progress Note:   08/02/23 1007  Mobility  Activity Ambulated with assistance in hallway;Ambulated with assistance in room  Level of Assistance Contact guard assist, steadying assist  Assistive Device Front wheel walker  Distance Ambulated (ft) 470 ft  RUE Weight Bearing Per Provider Order NWB  LUE Weight Bearing Per Provider Order NWB  Activity Response Tolerated well  Mobility Referral Yes  Mobility visit 1 Mobility  Mobility Specialist Start Time (ACUTE ONLY) 1000  Mobility Specialist Stop Time (ACUTE ONLY) 1007  Mobility Specialist Time Calculation (min) (ACUTE ONLY) 7 min   Pt received in bed, agreeable to mobility session. Ambulated in hallway, CGA to sit EOB, SV during session. Tolerated well, asx throughout, max HR 95 bpm. Returned pt to room, lying in bed with all needs met.    Lameka Disla Mobility Specialist Please contact via Special educational needs teacher or  Rehab office at [phone]

## 2023-08-02 NOTE — Plan of Care (Signed)
   Problem: Education: Goal: Ability to describe self-care measures that may prevent or decrease complications (Diabetes Survival Skills Education) will improve Outcome: Progressing

## 2023-08-02 NOTE — Progress Notes (Signed)
 Patient daughter ask if patient can go home on a different/stronger antibiotic for urinary tract infections that are chronic with him.

## 2023-08-03 ENCOUNTER — Other Ambulatory Visit (HOSPITAL_COMMUNITY): Payer: Self-pay

## 2023-08-03 MED ORDER — MIDODRINE HCL 5 MG PO TABS
5.0000 mg | ORAL_TABLET | Freq: Three times a day (TID) | ORAL | 1 refills | Status: DC
  Filled 2023-08-03: qty 90, 30d supply, fill #0

## 2023-08-03 MED ORDER — EZETIMIBE 10 MG PO TABS
10.0000 mg | ORAL_TABLET | Freq: Every day | ORAL | 1 refills | Status: DC
  Filled 2023-08-03: qty 30, 30d supply, fill #0

## 2023-08-03 MED ORDER — CLOPIDOGREL BISULFATE 75 MG PO TABS
75.0000 mg | ORAL_TABLET | Freq: Every day | ORAL | 1 refills | Status: AC
  Filled 2023-08-03: qty 30, 30d supply, fill #0

## 2023-08-03 MED ORDER — AMIODARONE HCL 200 MG PO TABS
ORAL_TABLET | ORAL | 1 refills | Status: DC
  Filled 2023-08-03: qty 30, 20d supply, fill #0

## 2023-08-03 MED ORDER — OXYCODONE HCL 10 MG PO TABS
10.0000 mg | ORAL_TABLET | Freq: Four times a day (QID) | ORAL | 0 refills | Status: AC | PRN
Start: 2023-08-03 — End: ?
  Filled 2023-08-03: qty 24, 6d supply, fill #0

## 2023-08-03 MED ORDER — ROSUVASTATIN CALCIUM 40 MG PO TABS
40.0000 mg | ORAL_TABLET | Freq: Every day | ORAL | 1 refills | Status: DC
  Filled 2023-08-03: qty 30, 30d supply, fill #0

## 2023-08-03 NOTE — TOC Transition Note (Signed)
 Transition of Care (TOC) - Discharge Note Sherin Dingwall RN, BSN Transitions of Care Unit 4E- RN Case Manager See Treatment Team for direct phone #   Patient Details  Name: Mark [name] MRN: [id number] Date of Birth: 03/16/1950  Transition of Care [facility]) CM/SW Contact:  Rox Cope, RN Phone Number: 08/03/2023, 9:39 AM   Clinical Narrative:    Pt stable for transition home today, HH has been set up w/ [facility] for HHPT/OT. Liaison notified for start of care.  DME- rollator has been delivered by [employer] to bedside.   Pt has transportation home, No further TOC needs noted.    Final next level of care: Home w Home Health Services Barriers to Discharge: No Barriers Identified   Patient Goals and CMS Choice Patient states their goals for this hospitalization and ongoing recovery are:: Patient's goal is for rehab and then return home after.          Discharge Placement               Home w/ [facility]        Discharge Plan and Services Additional resources added to the After Visit Summary for   In-house Referral: NA Discharge Planning Services: CM Consult Post Acute Care Choice: IP Rehab          DME Arranged: Walker rolling with seat DME Agency: [employer] Date DME Agency Contacted: 08/02/23 Time DME Agency Contacted: 1 Representative spoke with at DME Agency: Zack HH Arranged: PT, OT HH Agency: [facility] Date [facility] Agency Contacted: 08/02/23 Time HH Agency Contacted: 1451 Representative spoke with at [facility] Agency: Randel Buss  Social Drivers of Health (SDOH) Interventions SDOH Screenings   Food Insecurity: No Food Insecurity (07/18/2023)  Housing: Low Risk  (07/18/2023)  Transportation Needs: No Transportation Needs (07/18/2023)  Utilities: Not At Risk (07/18/2023)  Social Connections: Moderately Integrated (07/18/2023)  Tobacco Use: Low Risk  (07/22/2023)     Readmission Risk Interventions    08/03/2023    9:39 AM  Readmission Risk Prevention Plan  Post  Dischage Appt Complete  Medication Screening Complete  Transportation Screening Complete

## 2023-08-03 NOTE — Progress Notes (Signed)
 Physical Therapy Treatment Patient Details Name: Mark [name] MRN: [id number] DOB: [birth year] Today's Date: 08/03/2023   History of Present Illness 73 yo presented 07/16/23 due to chest pain. EKG and troponins not indicative of ischemia. CTA chest indicated moderate 3 vessel disease; 6/3 L heart cath; 6/6 CABGx2; post-op vasoplegia with hypotension    PT Comments  Pt resting in bed on arrival and agreeable to session with focus on gait with personal rollator for icnreased activity tolerance and education for increased safety with new AD. Pt receptive to education on rollator brake use and safety with sitting on seat for rest breaks with pt able to demonstrate understanding with use during session. Pt progressing gait tolerance with rollator support with noted improvement in DOE as well 0-1/4 during activity. Pt was educated on continued rollator use to maximize functional independence, safety, and decrease risk for falls.  Pt continues to benefit from skilled PT services to progress toward functional mobility goals.      If plan is discharge home, recommend the following: Assistance with cooking/housework;Assist for transportation;Help with stairs or ramp for entrance   Can travel by private vehicle        Equipment Recommendations  Other (comment)    Recommendations for Other Services       Precautions / Restrictions Precautions Precautions: Sternal;Fall Precaution Booklet Issued: No Recall of Precautions/Restrictions: Intact Precaution/Restrictions Comments: adheres to sternal precautions Restrictions Weight Bearing Restrictions Per Provider Order: No Other Position/Activity Restrictions: sternal     Mobility  Bed Mobility Overal bed mobility: Needs Assistance Bed Mobility: Rolling, Sidelying to Sit Rolling: Contact guard assist Sidelying to sit: Contact guard assist     Sit to sidelying: Contact guard assist General bed mobility comments: CGA for safety     Transfers Overall transfer level: Needs assistance Equipment used: None Transfers: Sit to/from Stand Sit to Stand: Contact guard assist           General transfer comment: hands on knees    Ambulation/Gait Ambulation/Gait assistance: Supervision Gait Distance (Feet): 800 Feet Assistive device: Rollator (4 wheels) Gait Pattern/deviations: Step-through pattern, Wide base of support, Decreased stride length Gait velocity: decr     General Gait Details: safe use of rollator   Stairs   Stairs assistance: Contact guard assist Stair Management: One rail Right, One rail Left, Step to pattern, Forwards   General stair comments: stepping up/down single step in room x10 on R and x5 leading with L   Wheelchair Mobility     Tilt Bed    Modified Rankin (Stroke Patients Only)       Balance Overall balance assessment: Needs assistance Sitting-balance support: No upper extremity supported, Feet supported Sitting balance-Leahy Scale: Good Sitting balance - Comments: EOB   Standing balance support: No upper extremity supported Standing balance-Leahy Scale: Fair Standing balance comment: standing at sink during self care tasks with CGA for safety                            Communication Communication Communication: No apparent difficulties  Cognition Arousal: Alert Behavior During Therapy: WFL for tasks assessed/performed   PT - Cognitive impairments: No apparent impairments                         Following commands: Intact      Cueing Cueing Techniques: Verbal cues  Exercises      General Comments General comments (skin integrity, edema, etc.):  VSS on RA      Pertinent Vitals/Pain Pain Assessment Pain Assessment: No/denies pain Pain Intervention(s): Monitored during session    Home Living                          Prior Function            PT Goals (current goals can now be found in the care plan section) Acute  Rehab PT Goals Patient Stated Goal: be able to care for himself PT Goal Formulation: With patient Time For Goal Achievement: 08/09/23 Progress towards PT goals: Progressing toward goals    Frequency    Min 3X/week      PT Plan      Co-evaluation              AM-PAC PT 6 Clicks Mobility   Outcome Measure  Help needed turning from your back to your side while in a flat bed without using bedrails?: A Little Help needed moving from lying on your back to sitting on the side of a flat bed without using bedrails?: A Little Help needed moving to and from a bed to a chair (including a wheelchair)?: A Little Help needed standing up from a chair using your arms (e.g., wheelchair or bedside chair)?: A Little Help needed to walk in hospital room?: A Little Help needed climbing 3-5 steps with a railing? : A Little 6 Click Score: 18    End of Session   Activity Tolerance: Patient tolerated treatment well Patient left: in bed;with call bell/phone within reach Nurse Communication: Mobility status PT Visit Diagnosis: Unsteadiness on feet (R26.81);Difficulty in walking, not elsewhere classified (R26.2)     Time: 1610-9604 PT Time Calculation (min) (ACUTE ONLY): 16 min  Charges:    $Gait Training: 8-22 mins PT General Charges $$ ACUTE PT VISIT: 1 Visit                     Aubriegh Minch R. PTA Acute Rehabilitation Services Office: [phone]   Agapito Horseman 08/03/2023, 12:47 PM

## 2023-08-03 NOTE — Progress Notes (Signed)
                  [address]           [place], [place] [address]                     [phone]        12 Days Post-Op Procedure(s) (LRB): CORONARY ARTERY BYPASS GRAFTING X 2, USING LEFT INTERNAL MAMMARY ARTERY AND RIGHT ENDOSCOPIC HARVESTED GREATER SAPHENOUS VEIN (N/A) ECHOCARDIOGRAM, TRANSESOPHAGEAL, INTRAOPERATIVE (N/A)  Subjective: Patient without any complaint this am. He is looking forward to going home.  Objective: Vital signs in last 24 hours: Temp:  [97.8 F (36.6 C)-98.4 F (36.9 C)] 97.8 F (36.6 C) (06/18 0410) Pulse Rate:  [71-77] 74 (06/18 0410) Cardiac Rhythm: Normal sinus rhythm (06/17 2325) Resp:  [14-21] 18 (06/18 0606) BP: (99-112)/(52-83) 107/52 (06/18 0410) SpO2:  [95 %-97 %] 95 % (06/18 0410) Weight:  [73 kg] 73 kg (06/18 0606)  Pre op weight 74.4 kg Current Weight  08/03/23 73 kg      Intake/Output from previous day: 06/17 0701 - 06/18 0700 In: 150 [P.O.:150] Out: 2050 [Urine:2050]   Physical Exam:  Cardiovascular: RRR Pulmonary: Clear to auscultation bilaterally Abdomen: Soft, non tender, bowel sounds present. Extremities: No lower extremity edema. Wounds: Clean and dry.  No erythema or signs of infection.  Lab Results: CBC:No results for input(s): WBC, HGB, HCT, PLT in the last 72 hours. BMET: No results for input(s): NA, K, CL, CO2, GLUCOSE, BUN, CREATININE, CALCIUM  in the last 72 hours.  PT/INR:  Lab Results  Component Value Date   INR 1.8 (H) 07/22/2023   INR 1.3 (H) 07/21/2023   ABG:  INR: Will add last result for INR, ABG once components are confirmed Will add last 4 CBG results once components are confirmed  Assessment/Plan:  1. CV - History of atrial fibrillation. He has been maintaining SR, first degree heart block. On Amiodarone  200 mg bid, Midodrine  5 mg tid, and Plavix  75 mg daily. 2.  Pulmonary - On room air. CXR this am appears stable (appears trace right apical  pneumothorax resolved, atelectasis). Encourage incentive spirometer. 3.  Expected post op acute blood loss anemia - Last H and H stable at 9.2 and 28.8 4. History of CKD (stage III)-Last creatinine  stable at 1.33 5. History of pre diabetes-CBGs 157/150/104. On Metformin  500 mg at breakfast as taken prior to surgery. Pre op HGA1C 6.1 6. Thrombocytopenia-Last platelets up to 120,000 7. Disposition- insurance denied both CIR and SNF. Patient to go home with home PT/OT  Analee Montee M ZimmermanPA-C 7:14 AM

## 2023-08-04 ENCOUNTER — Other Ambulatory Visit: Payer: Self-pay | Admitting: Physician Assistant

## 2023-08-04 MED ORDER — AMIODARONE HCL 200 MG PO TABS: ORAL_TABLET | ORAL | 1 refills | Status: DC

## 2023-08-04 NOTE — Progress Notes (Signed)
      [address]       [place] [address]             [phone]      Patient's daughter called and reported he was not given Amiodarone  prior to discharge. Will send Amiodarone  to [facility] in [facility] as requested.   Angela Barban, PA-C

## 2023-08-05 DIAGNOSIS — I480 Paroxysmal atrial fibrillation: Secondary | ICD-10-CM | POA: Diagnosis not present

## 2023-08-05 DIAGNOSIS — I25110 Atherosclerotic heart disease of native coronary artery with unstable angina pectoris: Secondary | ICD-10-CM | POA: Diagnosis not present

## 2023-08-05 DIAGNOSIS — I440 Atrioventricular block, first degree: Secondary | ICD-10-CM | POA: Diagnosis not present

## 2023-08-05 DIAGNOSIS — E039 Hypothyroidism, unspecified: Secondary | ICD-10-CM | POA: Diagnosis not present

## 2023-08-05 DIAGNOSIS — Z48812 Encounter for surgical aftercare following surgery on the circulatory system: Secondary | ICD-10-CM | POA: Diagnosis not present

## 2023-08-05 DIAGNOSIS — N1831 Chronic kidney disease, stage 3a: Secondary | ICD-10-CM | POA: Diagnosis not present

## 2023-08-05 DIAGNOSIS — I214 Non-ST elevation (NSTEMI) myocardial infarction: Secondary | ICD-10-CM | POA: Diagnosis not present

## 2023-08-05 DIAGNOSIS — G629 Polyneuropathy, unspecified: Secondary | ICD-10-CM | POA: Diagnosis not present

## 2023-08-05 DIAGNOSIS — I129 Hypertensive chronic kidney disease with stage 1 through stage 4 chronic kidney disease, or unspecified chronic kidney disease: Secondary | ICD-10-CM | POA: Diagnosis not present

## 2023-08-08 NOTE — Addendum Note (Signed)
 Addendum  created 08/08/23 2120 by Erma Thom SAUNDERS, MD   Child order released for a procedure order, Clinical Note Signed, Intraprocedure Blocks edited, LDA created via procedure documentation, SmartForm saved

## 2023-08-08 NOTE — Anesthesia Procedure Notes (Signed)
 Central Venous Catheter Insertion Performed by: Erma Thom SAUNDERS, MD, anesthesiologist Start/End6/07/2023 11:20 AM, 07/22/2023 11:35 AM Patient location: Pre-op. Preanesthetic checklist: patient identified, IV checked, site marked, risks and benefits discussed, surgical consent, monitors and equipment checked, pre-op evaluation, timeout performed and anesthesia consent Lidocaine  1% used for infiltration and patient sedated Hand hygiene performed  and maximum sterile barriers used  Catheter size: 8.5 Fr PA cath and Central line was placed.MAC introducer Swan type:thermodilution Procedure performed using ultrasound guided technique. Ultrasound Notes:anatomy identified, needle tip was noted to be adjacent to the nerve/plexus identified, no ultrasound evidence of intravascular and/or intraneural injection and image(s) printed for medical record Attempts: 1 Following insertion, line sutured, dressing applied and Biopatch. Post procedure assessment: blood return through all ports, free fluid flow and no air  Patient tolerated the procedure well with no immediate complications.

## 2023-08-09 ENCOUNTER — Ambulatory Visit: Attending: Emergency Medicine | Admitting: Emergency Medicine

## 2023-08-09 ENCOUNTER — Encounter: Payer: Self-pay | Admitting: Emergency Medicine

## 2023-08-09 VITALS — BP 120/60 | HR 81 | Ht 66.5 in | Wt 155.0 lb

## 2023-08-09 DIAGNOSIS — I2510 Atherosclerotic heart disease of native coronary artery without angina pectoris: Secondary | ICD-10-CM | POA: Diagnosis not present

## 2023-08-09 DIAGNOSIS — Z951 Presence of aortocoronary bypass graft: Secondary | ICD-10-CM | POA: Diagnosis not present

## 2023-08-09 DIAGNOSIS — E785 Hyperlipidemia, unspecified: Secondary | ICD-10-CM

## 2023-08-09 DIAGNOSIS — I200 Unstable angina: Secondary | ICD-10-CM | POA: Diagnosis not present

## 2023-08-09 DIAGNOSIS — I480 Paroxysmal atrial fibrillation: Secondary | ICD-10-CM

## 2023-08-09 DIAGNOSIS — I10 Essential (primary) hypertension: Secondary | ICD-10-CM

## 2023-08-09 DIAGNOSIS — I214 Non-ST elevation (NSTEMI) myocardial infarction: Secondary | ICD-10-CM

## 2023-08-09 NOTE — Progress Notes (Signed)
 Cardiology Office Note:    Date:  08/09/2023  ID:  Mark [name], DOB [birth year], MRN [id number] PCP: Leonel Cole, MD  [facility] Providers Cardiologist:  Peter Swaziland, MD       Patient Profile:       Chief Complaint: Hospital follow-up post CABG History of Present Illness:  Mark [name] is a 73 y.o. male with visit-pertinent history of atrial fibrillation, bladder cancer post urostomy, CKD, colon cancer, GERD, hypertension, hyperlipidemia  He was seen by cardiology service during admission on 07/16/2023 for chest pain.  He underwent coronary CTA on 07/16/2023 showing coronary calcium  score of 891 (79th percentile).  FFR showed significant stenosis either at the distal left main or ostial LAD/LCx bifurcation.  Echocardiogram 07/18/2023 showed LVEF 55 to 60%, no RWMA, mild mitral valve regurgitation.  Cardiac catheterization was completed on 07/19/2023 showing 65% distal left main lesion with mild diffuse disease of LAD and left circumflex.  Moderate diffuse disease of the right coronary artery.  He underwent 2V CABG on 6/6 with LIMA-LAD, SVG-OM.  He developed atrial fibrillation during CABG and treated with IV amiodarone .  He converted to NSR and was transition to oral regimen.  He was started on midodrine  due to vasoplegia.  His home BP meds were held at discharge.  He was discharged home in NSR.   Discussed the use of AI scribe software for clinical note transcription with the patient, who gave verbal consent to proceed.  History of Present Illness Mark [name] is a 73 year old male with coronary artery disease who presents for follow-up after recent CABG.  He is accompanied by his daughter,   Today he notes he is doing well overall.  He feels much improved.  He is without any further chest pain or anginal symptoms.  He denies any symptoms concerning for recurrent atrial fibrillation.  No palpitations or tachycardia have occurred since discharge.  His medication regimen includes  rosuvastatin  and Zetia  for cholesterol management, and aspirin  and Plavix  for antiplatelet therapy. His LDL cholesterol is elevated at 85, with a target of less than 55. He also takes midodrine  for low blood pressure and patient is interested in stopping this medication today.  He reports feeling well overall, with improved pain and no significant chest pain or shortness of breath at rest. Mild shortness of breath occurs with prolonged walking, but he denies palpitations, lightheadedness, or dizziness. He resides temporarily with his son for post-surgical support.  Review of systems:  Please see the history of present illness. All other systems are reviewed and otherwise negative.      Studies Reviewed:    EKG Interpretation Date/Time:  Tuesday August 09 2023 14:39:21 EDT Ventricular Rate:  81 PR Interval:  218 QRS Duration:  138 QT Interval:  416 QTC Calculation: 483 R Axis:   -10  Text Interpretation: Sinus rhythm with 1st degree A-V block Left bundle branch block When compared with ECG of 24-Jul-2023 04:08, Sinus rhythm has replaced Atrial fibrillation Vent. rate has decreased BY  48 BPM Confirmed by Rana Dixon [phone]) on 08/09/2023 3:01:32 PM    Echocardiogram 07/18/2023 1. Left ventricular ejection fraction, by estimation, is 55 to 60%. Left  ventricular ejection fraction by 2D MOD biplane is 55.2 %. The left  ventricle has normal function. The left ventricle has no regional wall  motion abnormalities. Left ventricular  diastolic parameters were normal.   2. Peak RV-RA gradient 17 mmHg. Right ventricular systolic function is  normal. The right ventricular  size is normal.   3. Left atrial size was mildly dilated.   4. The mitral valve is normal in structure. Mild mitral valve  regurgitation. No evidence of mitral stenosis.   5. The aortic valve is tricuspid. There is mild calcification of the  aortic valve. Aortic valve regurgitation is trivial. No aortic stenosis is   present.   6. IVC not visualized.   Cardiac catheterization 07/19/2023   Dist LM to Ost LAD lesion is 65% stenosed.   1.  65% distal left main lesion with mild diffuse disease of the LAD and left circumflex. 2.  Moderate diffuse disease of the right coronary artery; a pressure wire assessment was performed to assess the hemodynamic significance of this burden of coronary atherosclerosis.  The RFR was 0.96 with a pressure wire position in the right posterolateral ventricular branch consistent with hemodynamically insignificant disease. 3.  LVEDP of 14 mmHg Diagnostic Dominance: Right  Risk Assessment/Calculations:    CHA2DS2-VASc Score = 2   This indicates a 2.2% annual risk of stroke. The patient's score is based upon: CHF History: 0 HTN History: 1 Diabetes History: 0 Stroke History: 0 Vascular Disease History: 0 Age Score: 1 Gender Score: 0             Physical Exam:   VS:  BP 120/60   Pulse 81   Ht 5' 6.5 (1.689 m)   Wt 155 lb (70.3 kg)   SpO2 98%   BMI 24.64 kg/m    Wt Readings from Last 3 Encounters:  08/09/23 155 lb (70.3 kg)  08/03/23 160 lb 15 oz (73 kg)  08/10/22 165 lb (74.8 kg)    GEN: Well nourished, well developed in no acute distress NECK: No JVD; No carotid bruits CARDIAC: RRR, no murmurs, rubs, gallops RESPIRATORY:  Clear to auscultation without rales, wheezing or rhonchi  ABDOMEN: Soft, non-tender, non-distended EXTREMITIES:  No edema; No acute deformity      Assessment and Plan:  Coronary artery disease NSTEMI S/p CABG x 2 utilizing LIMA to LAD and SVG to OM on 07/2023 LHC 07/2023 showed distal LM to ostial LAD with 65% stenosis Echocardiogram 07/18/2023 showed LV EF 55 to 60% - EKG today shows NSR without acute ischemic changes with chronic LBBB - Today chest pains have entirely resolved.  Denies any exertional chest pains and no anginal symptoms reported.  There is no indication for further ischemic evaluation at this time - Incision is  healing appropriately without signs of infection, hematoma, or dehiscence - Has follow-up with cardiothoracic surgery on 08/30/2023 - Developed vasoplegia postop and was managed on midodrine  TID.  Blood pressure has improved.  Will change midodrine  to as needed at this time and have him closely monitor BP at home.  Continue to hold PTA HTN medication - No beta-blocker due to labile BP in hospital - Continue aspirin  81 g daily, clopidogrel  75 mg daily, rosuvastatin  40 mg daily, ezetimibe  10 mg daily - BMET and CBC today  Paroxysmal atrial fibrillation Remote history in 2000  Remote history of PAF per patient, has been on OAC in the past however discontinued due to lack of recurrent episode - Post CABG he developed atrial fibrillation and was treated with IV amiodarone  and converted to NSR - EKG today shows patient is maintaining NSR - He denies symptoms concerning for recurrent atrial fibrillation - Can plan to discontinue amiodarone  after 3 months of therapy  Hyperlipidemia, LDL goal <55 Lipoprotein (a) 15.3 on 07/2023 LDL 85, TG 259 on  07/2023 LDL currently not well-controlled however recently started on rosuvastatin  and ezetimibe  - Plan for repeat fasting lipid panel at follow-up visit - Continue rosuvastatin  40 mg daily and ezetimibe  10 mg daily  Hypertension Blood pressure today is well-controlled at 120/60 - Continue to hold home antihypertensive medication and can switch midodrine  to as needed - Monitor blood pressure closely at home and log     Cardiac Rehabilitation Eligibility Assessment  The patient is ready to start cardiac rehabilitation pending clearance from the cardiac surgeon.     Dispo:  Return in about 3 months (around 11/09/2023).  Signed, Lum LITTIE Louis, NP

## 2023-08-09 NOTE — Patient Instructions (Signed)
 Medication Instructions:  STOP TAKING MIDODRINE .  CONTINUE WITH ALL OTHER CURRENT MEDICATION THERAPY.  Lab Work: Nutritional therapist AND CBC TO BE DONE TODAY.   Testing/Procedures: NONE  Follow-Up: At [facility], you and your health needs are our priority.  As part of our continuing mission to provide you with exceptional heart care, our providers are all part of one team.  This team includes your primary Cardiologist (physician) and Advanced Practice Providers or APPs (Physician Assistants and Nurse Practitioners) who all work together to provide you with the care you need, when you need it.  Your next appointment:   3 months  Provider:   Peter Swaziland, MD OR Lum Louis, [place]

## 2023-08-10 ENCOUNTER — Ambulatory Visit: Payer: Self-pay | Admitting: Emergency Medicine

## 2023-08-10 DIAGNOSIS — I129 Hypertensive chronic kidney disease with stage 1 through stage 4 chronic kidney disease, or unspecified chronic kidney disease: Secondary | ICD-10-CM | POA: Diagnosis not present

## 2023-08-10 DIAGNOSIS — Z48812 Encounter for surgical aftercare following surgery on the circulatory system: Secondary | ICD-10-CM | POA: Diagnosis not present

## 2023-08-10 DIAGNOSIS — I214 Non-ST elevation (NSTEMI) myocardial infarction: Secondary | ICD-10-CM | POA: Diagnosis not present

## 2023-08-10 DIAGNOSIS — I25110 Atherosclerotic heart disease of native coronary artery with unstable angina pectoris: Secondary | ICD-10-CM | POA: Diagnosis not present

## 2023-08-10 DIAGNOSIS — I440 Atrioventricular block, first degree: Secondary | ICD-10-CM | POA: Diagnosis not present

## 2023-08-10 DIAGNOSIS — G629 Polyneuropathy, unspecified: Secondary | ICD-10-CM | POA: Diagnosis not present

## 2023-08-10 DIAGNOSIS — I480 Paroxysmal atrial fibrillation: Secondary | ICD-10-CM | POA: Diagnosis not present

## 2023-08-10 DIAGNOSIS — N1831 Chronic kidney disease, stage 3a: Secondary | ICD-10-CM | POA: Diagnosis not present

## 2023-08-10 DIAGNOSIS — E039 Hypothyroidism, unspecified: Secondary | ICD-10-CM | POA: Diagnosis not present

## 2023-08-10 LAB — CBC
Hematocrit: 38.3 % (ref 37.5–51.0)
Hemoglobin: 11.9 g/dL — ABNORMAL LOW (ref 13.0–17.7)
MCH: 27.7 pg (ref 26.6–33.0)
MCHC: 31.1 g/dL — ABNORMAL LOW (ref 31.5–35.7)
MCV: 89 fL (ref 79–97)
Platelets: 358 10*3/uL (ref 150–450)
RBC: 4.29 x10E6/uL (ref 4.14–5.80)
RDW: 14.4 % (ref 11.6–15.4)
WBC: 7.4 10*3/uL (ref 3.4–10.8)

## 2023-08-10 LAB — BASIC METABOLIC PANEL WITH GFR
BUN/Creatinine Ratio: 16 (ref 10–24)
BUN: 23 mg/dL (ref 8–27)
CO2: 20 mmol/L (ref 20–29)
Calcium: 9.6 mg/dL (ref 8.6–10.2)
Chloride: 98 mmol/L (ref 96–106)
Creatinine, Ser: 1.42 mg/dL — ABNORMAL HIGH (ref 0.76–1.27)
Glucose: 98 mg/dL (ref 70–99)
Potassium: 5.2 mmol/L (ref 3.5–5.2)
Sodium: 136 mmol/L (ref 134–144)
eGFR: 52 mL/min/{1.73_m2} — ABNORMAL LOW (ref >59)

## 2023-08-11 DIAGNOSIS — Z48812 Encounter for surgical aftercare following surgery on the circulatory system: Secondary | ICD-10-CM | POA: Diagnosis not present

## 2023-08-11 DIAGNOSIS — N1831 Chronic kidney disease, stage 3a: Secondary | ICD-10-CM | POA: Diagnosis not present

## 2023-08-11 DIAGNOSIS — I25110 Atherosclerotic heart disease of native coronary artery with unstable angina pectoris: Secondary | ICD-10-CM | POA: Diagnosis not present

## 2023-08-11 DIAGNOSIS — I129 Hypertensive chronic kidney disease with stage 1 through stage 4 chronic kidney disease, or unspecified chronic kidney disease: Secondary | ICD-10-CM | POA: Diagnosis not present

## 2023-08-11 DIAGNOSIS — I480 Paroxysmal atrial fibrillation: Secondary | ICD-10-CM | POA: Diagnosis not present

## 2023-08-11 DIAGNOSIS — I214 Non-ST elevation (NSTEMI) myocardial infarction: Secondary | ICD-10-CM | POA: Diagnosis not present

## 2023-08-11 DIAGNOSIS — E039 Hypothyroidism, unspecified: Secondary | ICD-10-CM | POA: Diagnosis not present

## 2023-08-11 DIAGNOSIS — G629 Polyneuropathy, unspecified: Secondary | ICD-10-CM | POA: Diagnosis not present

## 2023-08-11 DIAGNOSIS — I440 Atrioventricular block, first degree: Secondary | ICD-10-CM | POA: Diagnosis not present

## 2023-08-12 ENCOUNTER — Telehealth: Payer: Self-pay | Admitting: Pharmacist

## 2023-08-12 ENCOUNTER — Telehealth (HOSPITAL_COMMUNITY): Payer: Self-pay

## 2023-08-12 DIAGNOSIS — I129 Hypertensive chronic kidney disease with stage 1 through stage 4 chronic kidney disease, or unspecified chronic kidney disease: Secondary | ICD-10-CM | POA: Diagnosis not present

## 2023-08-12 DIAGNOSIS — E039 Hypothyroidism, unspecified: Secondary | ICD-10-CM | POA: Diagnosis not present

## 2023-08-12 DIAGNOSIS — I214 Non-ST elevation (NSTEMI) myocardial infarction: Secondary | ICD-10-CM | POA: Diagnosis not present

## 2023-08-12 DIAGNOSIS — I440 Atrioventricular block, first degree: Secondary | ICD-10-CM | POA: Diagnosis not present

## 2023-08-12 DIAGNOSIS — Z951 Presence of aortocoronary bypass graft: Secondary | ICD-10-CM

## 2023-08-12 DIAGNOSIS — I25110 Atherosclerotic heart disease of native coronary artery with unstable angina pectoris: Secondary | ICD-10-CM | POA: Diagnosis not present

## 2023-08-12 DIAGNOSIS — G629 Polyneuropathy, unspecified: Secondary | ICD-10-CM | POA: Diagnosis not present

## 2023-08-12 DIAGNOSIS — Z48812 Encounter for surgical aftercare following surgery on the circulatory system: Secondary | ICD-10-CM | POA: Diagnosis not present

## 2023-08-12 DIAGNOSIS — N1831 Chronic kidney disease, stage 3a: Secondary | ICD-10-CM | POA: Diagnosis not present

## 2023-08-12 DIAGNOSIS — E7800 Pure hypercholesterolemia, unspecified: Secondary | ICD-10-CM

## 2023-08-12 DIAGNOSIS — I480 Paroxysmal atrial fibrillation: Secondary | ICD-10-CM | POA: Diagnosis not present

## 2023-08-12 NOTE — Telephone Encounter (Signed)
 Called patient to see if he is interested in the Cardiac Rehab Program. Patient expressed interest. Explained scheduling process and went over insurance, patient verbalized understanding. Will contact patient for scheduling once f/u has been completed.

## 2023-08-12 NOTE — Telephone Encounter (Signed)
 Referral for lipid clinic placed prior to pt being d/c from hospital. He was started on rosuvastatin  40 mg and ezetimibe  10 mg daily.  He was not on any cholesterol medication prior to admission.  At this time there is no need for patient to be seen in the lipid clinic.  However I have asked patient to get a repeat lipid panel done on July 17 when he comes to see Dr. Kerrin.  I will enter the orders.  If labs are not at goal at that time we will have patient come in to see us .

## 2023-08-12 NOTE — Telephone Encounter (Signed)
 Pt insurance is active and benefits verified through [facility] Co-pay $25, DED 0/0 met, out of pocket $6,750/$471.07 met, co-insurance 0%. no pre-authorization required. Passport, 08/12/2023@9 :26, REF# [id number]   How many CR sessions are covered? (ICR)72 Is this a lifetime maximum or an annual maximum? annual Has the member used any of these services to date? no Is there a time limit (weeks/months) on start of program and/or program completion? no     Will contact patient to see if he is interested in the Cardiac Rehab Program. If interested, patient will need to complete follow up appt. Once completed, patient will be contacted for scheduling upon review by the RN Navigator.

## 2023-08-16 DIAGNOSIS — R7309 Other abnormal glucose: Secondary | ICD-10-CM | POA: Diagnosis not present

## 2023-08-16 DIAGNOSIS — Z8551 Personal history of malignant neoplasm of bladder: Secondary | ICD-10-CM | POA: Diagnosis not present

## 2023-08-16 DIAGNOSIS — G629 Polyneuropathy, unspecified: Secondary | ICD-10-CM | POA: Diagnosis not present

## 2023-08-16 DIAGNOSIS — I2510 Atherosclerotic heart disease of native coronary artery without angina pectoris: Secondary | ICD-10-CM | POA: Diagnosis not present

## 2023-08-16 DIAGNOSIS — I4891 Unspecified atrial fibrillation: Secondary | ICD-10-CM | POA: Diagnosis not present

## 2023-08-16 DIAGNOSIS — R1030 Lower abdominal pain, unspecified: Secondary | ICD-10-CM | POA: Diagnosis not present

## 2023-08-16 DIAGNOSIS — K5900 Constipation, unspecified: Secondary | ICD-10-CM | POA: Diagnosis not present

## 2023-08-16 DIAGNOSIS — N390 Urinary tract infection, site not specified: Secondary | ICD-10-CM | POA: Diagnosis not present

## 2023-08-16 DIAGNOSIS — G4700 Insomnia, unspecified: Secondary | ICD-10-CM | POA: Diagnosis not present

## 2023-08-17 DIAGNOSIS — I129 Hypertensive chronic kidney disease with stage 1 through stage 4 chronic kidney disease, or unspecified chronic kidney disease: Secondary | ICD-10-CM | POA: Diagnosis not present

## 2023-08-17 DIAGNOSIS — N1831 Chronic kidney disease, stage 3a: Secondary | ICD-10-CM | POA: Diagnosis not present

## 2023-08-17 DIAGNOSIS — G629 Polyneuropathy, unspecified: Secondary | ICD-10-CM | POA: Diagnosis not present

## 2023-08-17 DIAGNOSIS — I25110 Atherosclerotic heart disease of native coronary artery with unstable angina pectoris: Secondary | ICD-10-CM | POA: Diagnosis not present

## 2023-08-17 DIAGNOSIS — E039 Hypothyroidism, unspecified: Secondary | ICD-10-CM | POA: Diagnosis not present

## 2023-08-17 DIAGNOSIS — I480 Paroxysmal atrial fibrillation: Secondary | ICD-10-CM | POA: Diagnosis not present

## 2023-08-17 DIAGNOSIS — I440 Atrioventricular block, first degree: Secondary | ICD-10-CM | POA: Diagnosis not present

## 2023-08-17 DIAGNOSIS — I214 Non-ST elevation (NSTEMI) myocardial infarction: Secondary | ICD-10-CM | POA: Diagnosis not present

## 2023-08-17 DIAGNOSIS — Z48812 Encounter for surgical aftercare following surgery on the circulatory system: Secondary | ICD-10-CM | POA: Diagnosis not present

## 2023-08-26 DIAGNOSIS — G629 Polyneuropathy, unspecified: Secondary | ICD-10-CM | POA: Diagnosis not present

## 2023-08-26 DIAGNOSIS — I214 Non-ST elevation (NSTEMI) myocardial infarction: Secondary | ICD-10-CM | POA: Diagnosis not present

## 2023-08-26 DIAGNOSIS — Z48812 Encounter for surgical aftercare following surgery on the circulatory system: Secondary | ICD-10-CM | POA: Diagnosis not present

## 2023-08-26 DIAGNOSIS — I129 Hypertensive chronic kidney disease with stage 1 through stage 4 chronic kidney disease, or unspecified chronic kidney disease: Secondary | ICD-10-CM | POA: Diagnosis not present

## 2023-08-26 DIAGNOSIS — I480 Paroxysmal atrial fibrillation: Secondary | ICD-10-CM | POA: Diagnosis not present

## 2023-08-26 DIAGNOSIS — I25110 Atherosclerotic heart disease of native coronary artery with unstable angina pectoris: Secondary | ICD-10-CM | POA: Diagnosis not present

## 2023-08-26 DIAGNOSIS — E039 Hypothyroidism, unspecified: Secondary | ICD-10-CM | POA: Diagnosis not present

## 2023-08-26 DIAGNOSIS — I440 Atrioventricular block, first degree: Secondary | ICD-10-CM | POA: Diagnosis not present

## 2023-08-26 DIAGNOSIS — N1831 Chronic kidney disease, stage 3a: Secondary | ICD-10-CM | POA: Diagnosis not present

## 2023-08-29 ENCOUNTER — Other Ambulatory Visit: Payer: Self-pay | Admitting: Thoracic Surgery (Cardiothoracic Vascular Surgery)

## 2023-08-29 DIAGNOSIS — Z951 Presence of aortocoronary bypass graft: Secondary | ICD-10-CM

## 2023-08-30 ENCOUNTER — Ambulatory Visit: Admitting: Thoracic Surgery (Cardiothoracic Vascular Surgery)

## 2023-08-31 DIAGNOSIS — Z48812 Encounter for surgical aftercare following surgery on the circulatory system: Secondary | ICD-10-CM | POA: Diagnosis not present

## 2023-08-31 DIAGNOSIS — I480 Paroxysmal atrial fibrillation: Secondary | ICD-10-CM | POA: Diagnosis not present

## 2023-08-31 DIAGNOSIS — N1831 Chronic kidney disease, stage 3a: Secondary | ICD-10-CM | POA: Diagnosis not present

## 2023-08-31 DIAGNOSIS — E039 Hypothyroidism, unspecified: Secondary | ICD-10-CM | POA: Diagnosis not present

## 2023-08-31 DIAGNOSIS — G629 Polyneuropathy, unspecified: Secondary | ICD-10-CM | POA: Diagnosis not present

## 2023-08-31 DIAGNOSIS — I25110 Atherosclerotic heart disease of native coronary artery with unstable angina pectoris: Secondary | ICD-10-CM | POA: Diagnosis not present

## 2023-08-31 DIAGNOSIS — I440 Atrioventricular block, first degree: Secondary | ICD-10-CM | POA: Diagnosis not present

## 2023-08-31 DIAGNOSIS — I129 Hypertensive chronic kidney disease with stage 1 through stage 4 chronic kidney disease, or unspecified chronic kidney disease: Secondary | ICD-10-CM | POA: Diagnosis not present

## 2023-08-31 DIAGNOSIS — I214 Non-ST elevation (NSTEMI) myocardial infarction: Secondary | ICD-10-CM | POA: Diagnosis not present

## 2023-08-31 NOTE — Progress Notes (Unsigned)
 [address] [name] [address]             [phone]       HPI: Mr. Abdinasir Spadafore is a 73 year old man with medical history of remote paroxysmal atrial fibrillation (not on anticoagulation), chronic kidney disease (stage IIIa), bladder cancer (status post urostomy), colon cancer, GERD, hypertension, hyperlipidemia, and pre diabetes returns for routine postoperative follow-up having undergone Coronary artery bypass grafting x2, Left internal mammary artery to the LAD, Saphenous vein graft to obtuse marginal 1 and Endoscopic vein harvest right thigh by Dr. Kerrin on 07/22/2023. The patient's early postoperative recovery while in the hospital was notable for development of atrial fibrillation and was treated with amiodarone .  He converted to NSR and is taking oral amiodarone  daily.  He developed vasoplegia and was managed on midodrine  TID.   He was discharged home in stable condition on 08/03/2023.  Since hospital discharge the patient reports that he has been doing well.  He reports only some incisional pain at times which is managed with tylenol  and advil.  He reports that he has had some dizziness when standing up quickly.  It is not a daily occurrence. He has not been checking his blood pressure at home. He denies chest pain, shortness of breath, and passing out.    Current Outpatient Medications  Medication Sig Dispense Refill   acetaminophen  (TYLENOL ) 325 MG tablet Take 2 tablets (650 mg total) by mouth every 6 (six) hours as needed for mild pain (pain score 1-3) or moderate pain (pain score 4-6).     amiodarone  (PACERONE ) 200 MG tablet Take 1 tablet two times daily for 5 days then take 1 tablet daily thereafter 30 tablet 1   Ascorbic Acid (VITAMIN C PO) Take 1 tablet by mouth daily.     aspirin  EC 81 MG tablet Take 81 mg by mouth in the morning. Swallow whole.     bisacodyl  (DULCOLAX) 5 MG EC tablet Take 5 mg by mouth daily as needed.     clopidogrel   (PLAVIX ) 75 MG tablet Take 1 tablet (75 mg total) by mouth daily. 30 tablet 1   Cyanocobalamin (VITAMIN B12 PO) Take 1 tablet by mouth daily.     DULoxetine  (CYMBALTA ) 30 MG capsule Take 30 mg by mouth daily.     ezetimibe  (ZETIA ) 10 MG tablet Take 1 tablet (10 mg total) by mouth daily. 30 tablet 1   ferrous sulfate 324 MG TBEC Take 324 mg by mouth daily.     MAGNESIUM  PO Take 1 tablet by mouth daily.     metFORMIN  (GLUCOPHAGE ) 500 MG tablet Take 500 mg by mouth as needed.     Multiple Vitamins-Minerals (MULTIVITAMINS THER. W/MINERALS) TABS Take 1 tablet by mouth daily.     nitrofurantoin  (MACRODANTIN ) 100 MG capsule Take 100 mg by mouth daily.     Oxycodone  HCl 10 MG TABS Take 1 tablet (10 mg total) by mouth every 6 (six) hours as needed. 24 tablet 0   rosuvastatin  (CRESTOR ) 40 MG tablet Take 1 tablet (40 mg total) by mouth daily. 30 tablet 1   temazepam  (RESTORIL ) 30 MG capsule Take 30 mg by mouth at bedtime. For sleep.     No current facility-administered medications for this visit.   Vitals:   09/01/23 1327  BP: 91/60  Pulse: 83  Resp: 20  SpO2: 95%   Review of Systems  Constitutional: Negative.   Respiratory: Negative.  Negative for cough and shortness of breath.   Cardiovascular: Negative.  Negative for chest pain and leg swelling.  Neurological:  Negative for headaches.     Physical Exam: Physical Exam Constitutional:      Appearance: Normal appearance.  HENT:     Head: Normocephalic and atraumatic.  Cardiovascular:     Rate and Rhythm: Normal rate and regular rhythm.     Heart sounds: Normal heart sounds, S1 normal and S2 normal.  Pulmonary:     Breath sounds: Normal breath sounds.  Musculoskeletal:     Right ankle: No swelling.     Left ankle: No swelling.  Skin:    General: Skin is warm and dry.     Comments: Incision site on chest and right lower leg healed without erythema or drainage  Neurological:     General: No focal deficit present.     Mental Status:  He is alert and oriented to person, place, and time.       Diagnostic Tests: CLINICAL DATA:  Status post CABG.   EXAM: CHEST - 2 VIEW   COMPARISON:  Chest x-ray one view 07/28/2023.  Older exams as well   FINDINGS: Status post median sternotomy. Normal cardiopericardial silhouette. No pneumothorax, consolidation or edema. Small left pleural effusion. Diffuse degenerative changes along the spine. Osteopenia.   IMPRESSION: Postop chest.  Small left pleural effusion.     Electronically Signed   By: Ranell Bring M.D.   On: 09/01/2023 15:15    Plan: We reviewed today's chest x ray. We discussed driving and patient can start to drive as long as not taking narcotics for pain.  Start by driving short distances in the daytime and slowly increase.  He would like to participate in cardiac rehab and the order has been placed.  Discussed continuance of sternal precautions until 09/16/2023.  Reccommended that patient start checking his blood pressure at home and if gets consistently low readings report them to his cardiologist.   Since discharge patient has been to cardiology appointment.  Midodrine  is to be used PRN and patient is to continue to check blood pressure.  Holding antihypertensive medications due to hypotension. Patient is to continue on amiodarone  for 3 months worth of therapy.  Patient had increase in Lipitor and started on ezetimibe  for lipid control. Continue follow up with cardiology.   Follow up with [facility] as needed.   Manuelita CHRISTELLA Rough, PA-C [facility] [phone]

## 2023-09-01 ENCOUNTER — Ambulatory Visit (HOSPITAL_COMMUNITY)
Admission: RE | Admit: 2023-09-01 | Discharge: 2023-09-01 | Disposition: A | Discharge status: Home / Self Care | Source: Ambulatory Visit | Attending: Cardiology | Admitting: Cardiology

## 2023-09-01 ENCOUNTER — Ambulatory Visit: Payer: Self-pay

## 2023-09-01 VITALS — BP 91/60 | HR 83 | Resp 20 | Ht 66.5 in | Wt 157.0 lb

## 2023-09-01 DIAGNOSIS — Z951 Presence of aortocoronary bypass graft: Secondary | ICD-10-CM | POA: Diagnosis not present

## 2023-09-01 DIAGNOSIS — Z48812 Encounter for surgical aftercare following surgery on the circulatory system: Secondary | ICD-10-CM | POA: Diagnosis not present

## 2023-09-01 DIAGNOSIS — J90 Pleural effusion, not elsewhere classified: Secondary | ICD-10-CM | POA: Diagnosis not present

## 2023-09-01 NOTE — Patient Instructions (Signed)
You may continue to gradually increase your physical activity as tolerated.  Refrain from any heavy lifting or strenuous use of your arms and shoulders until at least 8 weeks from the time of your surgery, and avoid activities that cause increased pain in your chest on the side of your surgical incision.  Otherwise you may continue to increase activities without any particular limitations.  Increase the intensity and duration of physical activity gradually. 

## 2023-09-02 ENCOUNTER — Other Ambulatory Visit (HOSPITAL_COMMUNITY): Payer: Self-pay

## 2023-09-02 DIAGNOSIS — I440 Atrioventricular block, first degree: Secondary | ICD-10-CM | POA: Diagnosis not present

## 2023-09-02 DIAGNOSIS — G629 Polyneuropathy, unspecified: Secondary | ICD-10-CM | POA: Diagnosis not present

## 2023-09-02 DIAGNOSIS — I480 Paroxysmal atrial fibrillation: Secondary | ICD-10-CM | POA: Diagnosis not present

## 2023-09-02 DIAGNOSIS — I214 Non-ST elevation (NSTEMI) myocardial infarction: Secondary | ICD-10-CM | POA: Diagnosis not present

## 2023-09-02 DIAGNOSIS — I129 Hypertensive chronic kidney disease with stage 1 through stage 4 chronic kidney disease, or unspecified chronic kidney disease: Secondary | ICD-10-CM | POA: Diagnosis not present

## 2023-09-02 DIAGNOSIS — E039 Hypothyroidism, unspecified: Secondary | ICD-10-CM | POA: Diagnosis not present

## 2023-09-02 DIAGNOSIS — Z48812 Encounter for surgical aftercare following surgery on the circulatory system: Secondary | ICD-10-CM | POA: Diagnosis not present

## 2023-09-02 DIAGNOSIS — N1831 Chronic kidney disease, stage 3a: Secondary | ICD-10-CM | POA: Diagnosis not present

## 2023-09-02 DIAGNOSIS — I25110 Atherosclerotic heart disease of native coronary artery with unstable angina pectoris: Secondary | ICD-10-CM | POA: Diagnosis not present

## 2023-09-07 DIAGNOSIS — Z85828 Personal history of other malignant neoplasm of skin: Secondary | ICD-10-CM | POA: Diagnosis not present

## 2023-09-07 DIAGNOSIS — L821 Other seborrheic keratosis: Secondary | ICD-10-CM | POA: Diagnosis not present

## 2023-09-07 DIAGNOSIS — L570 Actinic keratosis: Secondary | ICD-10-CM | POA: Diagnosis not present

## 2023-09-07 DIAGNOSIS — D0439 Carcinoma in situ of skin of other parts of face: Secondary | ICD-10-CM | POA: Diagnosis not present

## 2023-09-07 DIAGNOSIS — D485 Neoplasm of uncertain behavior of skin: Secondary | ICD-10-CM | POA: Diagnosis not present

## 2023-09-07 DIAGNOSIS — C44329 Squamous cell carcinoma of skin of other parts of face: Secondary | ICD-10-CM | POA: Diagnosis not present

## 2023-09-07 DIAGNOSIS — D1801 Hemangioma of skin and subcutaneous tissue: Secondary | ICD-10-CM | POA: Diagnosis not present

## 2023-09-12 ENCOUNTER — Telehealth (HOSPITAL_COMMUNITY): Payer: Self-pay

## 2023-09-12 NOTE — Telephone Encounter (Signed)
 Attempted to call patient regarding scheduling for cardiac rehab- no answer, left message. Sent MyChart message.

## 2023-09-16 ENCOUNTER — Telehealth (HOSPITAL_COMMUNITY): Payer: Self-pay

## 2023-09-16 NOTE — Telephone Encounter (Signed)
 Called patient to see if he was interested in participating in the Cardiac Rehab Program. Patient will come in for orientation on 8/21 and will attend the 12:30 exercise class on [employer].  Sent MyChart message.

## 2023-10-03 ENCOUNTER — Other Ambulatory Visit: Payer: Self-pay | Admitting: Physician Assistant

## 2023-10-04 ENCOUNTER — Other Ambulatory Visit: Payer: Self-pay | Admitting: Physician Assistant

## 2023-10-06 ENCOUNTER — Encounter (HOSPITAL_COMMUNITY)
Admission: RE | Admit: 2023-10-06 | Discharge: 2023-10-06 | Disposition: A | Discharge status: Home / Self Care | Source: Ambulatory Visit | Attending: Cardiology | Admitting: Cardiology

## 2023-10-06 VITALS — BP 120/68 | Ht 66.5 in | Wt 158.3 lb

## 2023-10-06 DIAGNOSIS — I214 Non-ST elevation (NSTEMI) myocardial infarction: Secondary | ICD-10-CM | POA: Insufficient documentation

## 2023-10-06 DIAGNOSIS — Z951 Presence of aortocoronary bypass graft: Secondary | ICD-10-CM | POA: Diagnosis not present

## 2023-10-06 LAB — GLUCOSE, CAPILLARY: Glucose-Capillary: 114 mg/dL — ABNORMAL HIGH (ref 70–99)

## 2023-10-06 NOTE — Progress Notes (Signed)
 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 NSTEMI (non-ST elevated myocardial infarction) [facility])  Patient's Home Medications on Admission:  Current Outpatient Medications:    acetaminophen  (TYLENOL ) 325 MG tablet, Take 2 tablets (650 mg total) by mouth every 6 (six) hours as needed for mild pain (pain score 1-3) or moderate pain (pain score 4-6)., Disp: , Rfl:    amiodarone  (PACERONE ) 200 MG tablet, Take 1 tablet two times daily for 5 days then take 1 tablet daily thereafter, Disp: 30 tablet, Rfl: 1   Ascorbic Acid (VITAMIN C PO), Take 1 tablet by mouth daily., Disp: , Rfl:    aspirin  EC 81 MG tablet, Take 81 mg by mouth in the morning. Swallow whole., Disp: , Rfl:    bisacodyl  (DULCOLAX) 5 MG EC tablet, Take 5 mg by mouth daily as needed., Disp: , Rfl:    clopidogrel  (PLAVIX ) 75 MG tablet, Take 1 tablet (75 mg total) by mouth daily., Disp: 30 tablet, Rfl: 1   Cyanocobalamin (VITAMIN B12 PO), Take 1 tablet by mouth daily., Disp: , Rfl:    DULoxetine  (CYMBALTA ) 30 MG capsule, Take 30 mg by mouth daily., Disp: , Rfl:    ezetimibe  (ZETIA ) 10 MG tablet, Take 1 tablet (10 mg total) by mouth daily., Disp: 30 tablet, Rfl: 1   ferrous sulfate 324 MG TBEC, Take 324 mg by mouth daily., Disp: , Rfl:    MAGNESIUM  PO, Take 1 tablet by mouth daily., Disp: , Rfl:    metFORMIN  (GLUCOPHAGE ) 500 MG tablet, Take 500 mg by mouth as needed., Disp: , Rfl:    Multiple Vitamins-Minerals (MULTIVITAMINS THER. W/MINERALS) TABS,  Take 1 tablet by mouth daily., Disp: , Rfl:    nitrofurantoin  (MACRODANTIN ) 100 MG capsule, Take 100 mg by mouth daily., Disp: , Rfl:    Oxycodone  HCl 10 MG TABS, Take 1 tablet (10 mg total) by mouth every 6 (six) hours as needed., Disp: 24 tablet, Rfl: 0   rosuvastatin  (CRESTOR ) 40 MG tablet, Take 1 tablet (40 mg total) by mouth daily., Disp: 30 tablet, Rfl: 1   temazepam  (RESTORIL ) 30 MG capsule, Take 30 mg by mouth at bedtime. 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    Colon adenocarcinoma (HCC) 2001   T2, N1   Erosive esophagitis    GERD (gastroesophageal reflux disease)    Hiatal hernia    History of blood transfusion    Hyperlipidemia    Hypertension    Internal hemorrhoids    Neuropathy    feet   PONV (postoperative nausea and vomiting)    Prediabetes    Tubular adenoma of colon 12/2009    Tobacco Use: Social History   Tobacco Use  Smoking Status Never  Smokeless Tobacco Never    Labs: Review Flowsheet  Latest Ref Rng & Units 07/17/2023 07/19/2023 07/21/2023 07/22/2023  Labs for ITP Cardiac and Pulmonary Rehab  Cholestrol 0 - 200 mg/dL - 841  - -  LDL (calc) 0 - 99 mg/dL - 85  - -  HDL-C >59 mg/dL - 21  - -  Trlycerides <150 mg/dL - 740  - -  Hemoglobin A1c 4.8 - 5.6 % 6.1  - - -  PH, Arterial 7.35 - 7.45 - - 7.4  7.338  7.348  7.372  7.435  7.412   PCO2 arterial 32 - 48 mmHg - - 35  40.1  40.3  38.4  34.2  34.2   Bicarbonate 20.0 - 28.0 mmol/L - - 21.7  21.6  22.3  22.8  23.0  22.7  21.7   TCO2 22 - 32 mmol/L - - - 23  24  24  23  24  24  24  23  22  24    Acid-base deficit 0.0 - 2.0 mmol/L - - 2.5  4.0  3.0  3.0  1.0  2.0  3.0   O2 Saturation % - - 98.4  96  98  99  100  74  100     Details       Multiple values from one day are sorted in reverse-chronological order          Capillary Blood Glucose: Lab Results  Component Value Date   GLUCAP 114 (H) 10/06/2023   GLUCAP 104 (H) 08/01/2023   GLUCAP 150 (H) 07/31/2023   GLUCAP 157 (H) 07/31/2023   GLUCAP 137 (H) 07/31/2023     Exercise Target Goals: Exercise Program Goal: Individual exercise prescription set using results from initial 6 min walk test and THRR while considering  patient's activity barriers and safety.   Exercise Prescription Goal: Initial exercise prescription builds to 30-45 minutes a day of aerobic activity, 2-3 days per week.  Home exercise guidelines will be given to patient during program as part of exercise prescription that the participant will acknowledge.  Activity Barriers & Risk Stratification:  Activity Barriers & Cardiac Risk Stratification - 10/06/23 1330       Activity Barriers & Cardiac Risk Stratification   Activity Barriers Balance Concerns;Deconditioning;Shortness of Breath;Other (comment);Assistive Device    Comments sternal precautions, uses cane    Cardiac Risk Stratification High          6 Minute Walk:  6 Minute Walk     Row Name 10/06/23 1327         6 Minute Walk   Phase Initial     Distance 1243 feet     Walk Time 6 minutes     # of Rest Breaks 0     MPH 2.4     METS 2.8     RPE 11     Perceived Dyspnea  1     VO2 Peak 9.7     Symptoms Yes (comment)     Comments chronic bilateral foot pain 6/10, mild SOB, RPD =1, resolved with rest     Resting HR 74 bpm     Resting BP 120/68     Resting Oxygen Saturation  99 %     Exercise Oxygen Saturation  during 6 min walk 95 %     Max Ex. HR 101 bpm     Max Ex. BP 130/64     2 Minute Post BP 120/76        Oxygen Initial Assessment:   Oxygen Re-Evaluation:  Oxygen Discharge (Final Oxygen Re-Evaluation):   Initial Exercise Prescription:  Initial Exercise Prescription - 10/06/23 1300       Date of Initial Exercise RX and Referring Provider   Date 10/06/23    Referring  Provider Peter [place], MD    Expected Discharge Date 12/28/23      NuStep   Level 2    SPM 75    Minutes 15    METs 2.8      Track   Laps 10    Minutes 15    METs 2.8      Prescription Details   Frequency (times per week) 2    Duration Progress to 30 minutes of continuous aerobic without signs/symptoms of physical distress      Intensity   THRR 40-80% of Max Heartrate 59-118    Ratings of Perceived Exertion 11-13    Perceived Dyspnea 0-4      Progression   Progression Continue progressive overload as per policy without signs/symptoms or physical distress.      Resistance Training   Training Prescription Yes    Weight 3 lbs    Reps 10-15          Perform Capillary Blood Glucose checks as needed.  Exercise Prescription Changes:   Exercise Comments:   Exercise Goals and Review:   Exercise Goals     Row Name 10/06/23 1158             Exercise Goals   Increase Physical Activity Yes       Intervention Provide advice, education, support and counseling about physical activity/exercise needs.;Develop an individualized exercise prescription for aerobic and resistive training based on initial evaluation findings, risk stratification, comorbidities and participant's personal goals.       Expected Outcomes Short Term: Attend rehab on a regular basis to increase amount of physical activity.;Long Term: Exercising regularly at least 3-5 days a week.;Long Term: Add in home exercise to make exercise part of routine and to increase amount of physical activity.       Increase Strength and Stamina Yes       Intervention Provide advice, education, support and counseling about physical activity/exercise needs.;Develop an individualized exercise prescription for aerobic and resistive training based on initial evaluation findings, risk stratification, comorbidities and participant's personal goals.       Expected Outcomes Short Term: Increase workloads from initial exercise  prescription for resistance, speed, and METs.;Short Term: Perform resistance training exercises routinely during rehab and add in resistance training at home;Long Term: Improve cardiorespiratory fitness, muscular endurance and strength as measured by increased METs and functional capacity ( )       Able to understand and use rate of perceived exertion (RPE) scale Yes       Intervention Provide education and explanation on how to use RPE scale       Expected Outcomes Short Term: Able to use RPE daily in rehab to express subjective intensity level;Long Term:  Able to use RPE to guide intensity level when exercising independently       Knowledge and understanding of Target Heart Rate Range (THRR) Yes       Intervention Provide education and explanation of THRR including how the numbers were predicted and where they are located for reference       Expected Outcomes Short Term: Able to state/look up THRR;Long Term: Able to use THRR to govern intensity when exercising independently;Short Term: Able to use daily as guideline for intensity in rehab  Understanding of Exercise Prescription Yes       Intervention Provide education, explanation, and written materials on patient's individual exercise prescription       Expected Outcomes Short Term: Able to explain program exercise prescription;Long Term: Able to explain home exercise prescription to exercise independently          Exercise Goals Re-Evaluation :   Discharge Exercise Prescription (Final Exercise Prescription Changes):   Nutrition:  Target Goals: Understanding of nutrition guidelines, daily intake of sodium 1500mg , cholesterol 200mg , calories 30% from fat and 7% or less from saturated fats, daily to have 5 or more servings of fruits and vegetables.  Biometrics:  Pre Biometrics - 10/06/23 1125       Pre Biometrics   Waist Circumference 38 inches    Hip Circumference 41.5 inches    Waist to Hip Ratio 0.92 %    Triceps Skinfold  28 mm    % Body Fat 28.6 %    Grip Strength 20 kg    Flexibility 0 in    Single Leg Stand 2.2 seconds           Nutrition Therapy Plan and Nutrition Goals:   Nutrition Assessments:  MEDIFICTS Score Key: >=70 Need to make dietary changes  40-70 Heart Healthy Diet <= 40 Therapeutic Level Cholesterol Diet    Picture Your Plate Scores: <59 Unhealthy dietary pattern with much room for improvement. 41-50 Dietary pattern unlikely to meet recommendations for good health and room for improvement. 51-60 More healthful dietary pattern, with some room for improvement.  >60 Healthy dietary pattern, although there may be some specific behaviors that could be improved.    Nutrition Goals Re-Evaluation:   Nutrition Goals Re-Evaluation:   Nutrition Goals Discharge (Final Nutrition Goals Re-Evaluation):   Psychosocial: Target Goals: Acknowledge presence or absence of significant depression and/or stress, maximize coping skills, provide positive support system. Participant is able to verbalize types and ability to use techniques and skills needed for reducing stress and depression.  Initial Review & Psychosocial Screening:  Initial Psych Review & Screening - 10/06/23 1304       Initial Review   Current issues with Current Sleep Concerns   Pt is on cymbalta  and has restoril  for sleep     Family Dynamics   Good Support System? Yes   Pt has daughter/son-in-law: son/daughter-in-law     Barriers   Psychosocial barriers to participate in program There are no identifiable barriers or psychosocial needs.      Screening Interventions   Interventions Encouraged to exercise          Quality of Life Scores:  Quality of Life - 10/06/23 1300       Quality of Life   Select Quality of Life      Quality of Life Scores   Health/Function Pre 26.46 %    Socioeconomic Pre 26.25 %    Psych/Spiritual Pre 27.5 %    Family Pre 30 %    GLOBAL Pre 27.11 %         Scores of 19 and  below usually indicate a poorer quality of life in these areas.  A difference of  2-3 points is a clinically meaningful difference.  A difference of 2-3 points in the total score of the Quality of Life Index has been associated with significant improvement in overall quality of life, self-image, physical symptoms, and general health in studies assessing change in quality of life.  PHQ-9: Review Flowsheet  10/06/2023  Depression screen PHQ 2/9  Decreased Interest 1  Down, Depressed, Hopeless 0  PHQ - 2 Score 1  Altered sleeping 1  Tired, decreased energy 1  Change in appetite 2  Feeling bad or failure about yourself  0  Trouble concentrating 0  Moving slowly or fidgety/restless 0  Suicidal thoughts 0  PHQ-9 Score 5  Difficult doing work/chores Somewhat difficult   Interpretation of Total Score  Total Score Depression Severity:  1-4 = Minimal depression, 5-9 = Mild depression, 10-14 = Moderate depression, 15-19 = Moderately severe depression, 20-27 = Severe depression   Psychosocial Evaluation and Intervention:   Psychosocial Re-Evaluation:   Psychosocial Discharge (Final Psychosocial Re-Evaluation):   Vocational Rehabilitation: Provide vocational rehab assistance to qualifying candidates.   Vocational Rehab Evaluation & Intervention:  Vocational Rehab - 10/06/23 1303       Initial Vocational Rehab Evaluation & Intervention   Assessment shows need for Vocational Rehabilitation No   Pt is retired         Education: Education Goals: Education classes will be provided on a weekly basis, covering required topics. Participant will state understanding/return demonstration of topics presented.     Core Videos: Exercise    Move It!  Clinical staff conducted group or individual video education with verbal and written material and guidebook.  Patient learns the recommended Pritikin exercise program. Exercise with the goal of living a long, healthy life. Some of the  health benefits of exercise include controlled diabetes, healthier blood pressure levels, improved cholesterol levels, improved heart and lung capacity, improved sleep, and better body composition. Everyone should speak with their doctor before starting or changing an exercise routine.  Biomechanical Limitations Clinical staff conducted group or individual video education with verbal and written material and guidebook.  Patient learns how biomechanical limitations can impact exercise and how we can mitigate and possibly overcome limitations to have an impactful and balanced exercise routine.  Body Composition Clinical staff conducted group or individual video education with verbal and written material and guidebook.  Patient learns that body composition (ratio of muscle mass to fat mass) is a key component to assessing overall fitness, rather than body weight alone. Increased fat mass, especially visceral belly fat, can put us  at increased risk for metabolic syndrome, type 2 diabetes, heart disease, and even death. It is recommended to combine diet and exercise (cardiovascular and resistance training) to improve your body composition. Seek guidance from your physician and exercise physiologist before implementing an exercise routine.  Exercise Action Plan Clinical staff conducted group or individual video education with verbal and written material and guidebook.  Patient learns the recommended strategies to achieve and enjoy long-term exercise adherence, including variety, self-motivation, self-efficacy, and positive decision making. Benefits of exercise include fitness, good health, weight management, more energy, better sleep, less stress, and overall well-being.  Medical   Heart Disease Risk Reduction Clinical staff conducted group or individual video education with verbal and written material and guidebook.  Patient learns our heart is our most vital organ as it circulates oxygen, nutrients,  white blood cells, and hormones throughout the entire body, and carries waste away. Data supports a plant-based eating plan like the Pritikin Program for its effectiveness in slowing progression of and reversing heart disease. The video provides a number of recommendations to address heart disease.   Metabolic Syndrome and Belly Fat  Clinical staff conducted group or individual video education with verbal and written material and guidebook.  Patient learns what metabolic syndrome  is, how it leads to heart disease, and how one can reverse it and keep it from coming back. You have metabolic syndrome if you have 3 of the following 5 criteria: abdominal obesity, high blood pressure, high triglycerides, low HDL cholesterol, and high blood sugar.  Hypertension and Heart Disease Clinical staff conducted group or individual video education with verbal and written material and guidebook.  Patient learns that high blood pressure, or hypertension, is very common in the [place] . Hypertension is largely due to excessive salt intake, but other important risk factors include being overweight, physical inactivity, drinking too much alcohol, smoking, and not eating enough potassium from fruits and vegetables. High blood pressure is a leading risk factor for heart attack, stroke, congestive heart failure, dementia, kidney failure, and premature death. Long-term effects of excessive salt intake include stiffening of the arteries and thickening of heart muscle and organ damage. Recommendations include ways to reduce hypertension and the risk of heart disease.  Diseases of Our Time - Focusing on Diabetes Clinical staff conducted group or individual video education with verbal and written material and guidebook.  Patient learns why the best way to stop diseases of our time is prevention, through food and other lifestyle changes. Medicine (such as prescription pills and surgeries) is often only a Band-Aid on the problem,  not a long-term solution. Most common diseases of our time include obesity, type 2 diabetes, hypertension, heart disease, and cancer. The Pritikin Program is recommended and has been proven to help reduce, reverse, and/or prevent the damaging effects of metabolic syndrome.  Nutrition   Overview of the Pritikin Eating Plan  Clinical staff conducted group or individual video education with verbal and written material and guidebook.  Patient learns about the Pritikin Eating Plan for disease risk reduction. The Pritikin Eating Plan emphasizes a wide variety of unrefined, minimally-processed carbohydrates, like fruits, vegetables, whole grains, and legumes. Go, Caution, and Stop food choices are explained. Plant-based and lean animal proteins are emphasized. Rationale provided for low sodium intake for blood pressure control, low added sugars for blood sugar stabilization, and low added fats and oils for coronary artery disease risk reduction and weight management.  Calorie Density  Clinical staff conducted group or individual video education with verbal and written material and guidebook.  Patient learns about calorie density and how it impacts the Pritikin Eating Plan. Knowing the characteristics of the food you choose will help you decide whether those foods will lead to weight gain or weight loss, and whether you want to consume more or less of them. Weight loss is usually a side effect of the Pritikin Eating Plan because of its focus on low calorie-dense foods.  Label Reading  Clinical staff conducted group or individual video education with verbal and written material and guidebook.  Patient learns about the Pritikin recommended label reading guidelines and corresponding recommendations regarding calorie density, added sugars, sodium content, and whole grains.  Dining Out - Part 1  Clinical staff conducted group or individual video education with verbal and written material and guidebook.  Patient  learns that restaurant meals can be sabotaging because they can be so high in calories, fat, sodium, and/or sugar. Patient learns recommended strategies on how to positively address this and avoid unhealthy pitfalls.  Facts on Fats  Clinical staff conducted group or individual video education with verbal and written material and guidebook.  Patient learns that lifestyle modifications can be just as effective, if not more so, as many medications for lowering your  risk of heart disease. A Pritikin lifestyle can help to reduce your risk of inflammation and atherosclerosis (cholesterol build-up, or plaque, in the artery walls). Lifestyle interventions such as dietary choices and physical activity address the cause of atherosclerosis. A review of the types of fats and their impact on blood cholesterol levels, along with dietary recommendations to reduce fat intake is also included.  Nutrition Action Plan  Clinical staff conducted group or individual video education with verbal and written material and guidebook.  Patient learns how to incorporate Pritikin recommendations into their lifestyle. Recommendations include planning and keeping personal health goals in mind as an important part of their success.  Healthy Mind-Set    Healthy Minds, Bodies, Hearts  Clinical staff conducted group or individual video education with verbal and written material and guidebook.  Patient learns how to identify when they are stressed. Video will discuss the impact of that stress, as well as the many benefits of stress management. Patient will also be introduced to stress management techniques. The way we think, act, and feel has an impact on our hearts.  How Our Thoughts Can Heal Our Hearts  Clinical staff conducted group or individual video education with verbal and written material and guidebook.  Patient learns that negative thoughts can cause depression and anxiety. This can result in negative lifestyle behavior and  serious health problems. Cognitive behavioral therapy is an effective method to help control our thoughts in order to change and improve our emotional outlook.  Additional Videos:  Exercise    Improving Performance  Clinical staff conducted group or individual video education with verbal and written material and guidebook.  Patient learns to use a non-linear approach by alternating intensity levels and lengths of time spent exercising to help burn more calories and lose more body fat. Cardiovascular exercise helps improve heart health, metabolism, hormonal balance, blood sugar control, and recovery from fatigue. Resistance training improves strength, endurance, balance, coordination, reaction time, metabolism, and muscle mass. Flexibility exercise improves circulation, posture, and balance. Seek guidance from your physician and exercise physiologist before implementing an exercise routine and learn your capabilities and proper form for all exercise.  Introduction to Yoga  Clinical staff conducted group or individual video education with verbal and written material and guidebook.  Patient learns about yoga, a discipline of the coming together of mind, breath, and body. The benefits of yoga include improved flexibility, improved range of motion, better posture and core strength, increased lung function, weight loss, and positive self-image. Yoga's heart health benefits include lowered blood pressure, healthier heart rate, decreased cholesterol and triglyceride levels, improved immune function, and reduced stress. Seek guidance from your physician and exercise physiologist before implementing an exercise routine and learn your capabilities and proper form for all exercise.  Medical   Aging: Enhancing Your Quality of Life  Clinical staff conducted group or individual video education with verbal and written material and guidebook.  Patient learns key strategies and recommendations to stay in good physical  health and enhance quality of life, such as prevention strategies, having an advocate, securing a Health Care Proxy and Power of Attorney, and keeping a list of medications and system for tracking them. It also discusses how to avoid risk for bone loss.  Biology of Weight Control  Clinical staff conducted group or individual video education with verbal and written material and guidebook.  Patient learns that weight gain occurs because we consume more calories than we burn (eating more, moving less). Even if your body weight  is normal, you may have higher ratios of fat compared to muscle mass. Too much body fat puts you at increased risk for cardiovascular disease, heart attack, stroke, type 2 diabetes, and obesity-related cancers. In addition to exercise, following the Pritikin Eating Plan can help reduce your risk.  Decoding Lab Results  Clinical staff conducted group or individual video education with verbal and written material and guidebook.  Patient learns that lab test reflects one measurement whose values change over time and are influenced by many factors, including medication, stress, sleep, exercise, food, hydration, pre-existing medical conditions, and more. It is recommended to use the knowledge from this video to become more involved with your lab results and evaluate your numbers to speak with your doctor.   Diseases of Our Time - Overview  Clinical staff conducted group or individual video education with verbal and written material and guidebook.  Patient learns that according to the CDC, 50% to 70% of chronic diseases (such as obesity, type 2 diabetes, elevated lipids, hypertension, and heart disease) are avoidable through lifestyle improvements including healthier food choices, listening to satiety cues, and increased physical activity.  Sleep Disorders Clinical staff conducted group or individual video education with verbal and written material and guidebook.  Patient learns how  good quality and duration of sleep are important to overall health and well-being. Patient also learns about sleep disorders and how they impact health along with recommendations to address them, including discussing with a physician.  Nutrition  Dining Out - Part 2 Clinical staff conducted group or individual video education with verbal and written material and guidebook.  Patient learns how to plan ahead and communicate in order to maximize their dining experience in a healthy and nutritious manner. Included are recommended food choices based on the type of restaurant the patient is visiting.   Fueling a Banker conducted group or individual video education with verbal and written material and guidebook.  There is a strong connection between our food choices and our health. Diseases like obesity and type 2 diabetes are very prevalent and are in large-part due to lifestyle choices. The Pritikin Eating Plan provides plenty of food and hunger-curbing satisfaction. It is easy to follow, affordable, and helps reduce health risks.  Menu Workshop  Clinical staff conducted group or individual video education with verbal and written material and guidebook.  Patient learns that restaurant meals can sabotage health goals because they are often packed with calories, fat, sodium, and sugar. Recommendations include strategies to plan ahead and to communicate with the manager, chef, or server to help order a healthier meal.  Planning Your Eating Strategy  Clinical staff conducted group or individual video education with verbal and written material and guidebook.  Patient learns about the Pritikin Eating Plan and its benefit of reducing the risk of disease. The Pritikin Eating Plan does not focus on calories. Instead, it emphasizes high-quality, nutrient-rich foods. By knowing the characteristics of the foods, we choose, we can determine their calorie density and make informed  decisions.  Targeting Your Nutrition Priorities  Clinical staff conducted group or individual video education with verbal and written material and guidebook.  Patient learns that lifestyle habits have a tremendous impact on disease risk and progression. This video provides eating and physical activity recommendations based on your personal health goals, such as reducing LDL cholesterol, losing weight, preventing or controlling type 2 diabetes, and reducing high blood pressure.  Vitamins and Minerals  Clinical staff conducted group or individual  video education with verbal and written material and guidebook.  Patient learns different ways to obtain key vitamins and minerals, including through a recommended healthy diet. It is important to discuss all supplements you take with your doctor.   Healthy Mind-Set    Smoking Cessation  Clinical staff conducted group or individual video education with verbal and written material and guidebook.  Patient learns that cigarette smoking and tobacco addiction pose a serious health risk which affects millions of people. Stopping smoking will significantly reduce the risk of heart disease, lung disease, and many forms of cancer. Recommended strategies for quitting are covered, including working with your doctor to develop a successful plan.  Culinary   Becoming a Set designer conducted group or individual video education with verbal and written material and guidebook.  Patient learns that cooking at home can be healthy, cost-effective, quick, and puts them in control. Keys to cooking healthy recipes will include looking at your recipe, assessing your equipment needs, planning ahead, making it simple, choosing cost-effective seasonal ingredients, and limiting the use of added fats, salts, and sugars.  Cooking - Breakfast and Snacks  Clinical staff conducted group or individual video education with verbal and written material and guidebook.   Patient learns how important breakfast is to satiety and nutrition through the entire day. Recommendations include key foods to eat during breakfast to help stabilize blood sugar levels and to prevent overeating at meals later in the day. Planning ahead is also a key component.  Cooking - Educational psychologist conducted group or individual video education with verbal and written material and guidebook.  Patient learns eating strategies to improve overall health, including an approach to cook more at home. Recommendations include thinking of animal protein as a side on your plate rather than center stage and focusing instead on lower calorie dense options like vegetables, fruits, whole grains, and plant-based proteins, such as beans. Making sauces in large quantities to freeze for later and leaving the skin on your vegetables are also recommended to maximize your experience.  Cooking - Healthy Salads and Dressing Clinical staff conducted group or individual video education with verbal and written material and guidebook.  Patient learns that vegetables, fruits, whole grains, and legumes are the foundations of the Pritikin Eating Plan. Recommendations include how to incorporate each of these in flavorful and healthy salads, and how to create homemade salad dressings. Proper handling of ingredients is also covered. Cooking - Soups and [employer] - Soups and Desserts Clinical staff conducted group or individual video education with verbal and written material and guidebook.  Patient learns that Pritikin soups and desserts make for easy, nutritious, and delicious snacks and meal components that are low in sodium, fat, sugar, and calorie density, while high in vitamins, minerals, and filling fiber. Recommendations include simple and healthy ideas for soups and desserts.   Overview     The Pritikin Solution Program Overview Clinical staff conducted group or individual video education with  verbal and written material and guidebook.  Patient learns that the results of the Pritikin Program have been documented in more than 100 articles published in peer-reviewed journals, and the benefits include reducing risk factors for (and, in some cases, even reversing) high cholesterol, high blood pressure, type 2 diabetes, obesity, and more! An overview of the three key pillars of the Pritikin Program will be covered: eating well, doing regular exercise, and having a healthy mind-set.  WORKSHOPS  Exercise: Exercise Basics:  Building Your Action Plan Clinical staff led group instruction and group discussion with PowerPoint presentation and patient guidebook. To enhance the learning environment the use of posters, models and videos may be added. At the conclusion of this workshop, patients will comprehend the difference between physical activity and exercise, as well as the benefits of incorporating both, into their routine. Patients will understand the FITT (Frequency, Intensity, Time, and Type) principle and how to use it to build an exercise action plan. In addition, safety concerns and other considerations for exercise and cardiac rehab will be addressed by the presenter. The purpose of this lesson is to promote a comprehensive and effective weekly exercise routine in order to improve patients' overall level of fitness.   Managing Heart Disease: Your Path to a Healthier Heart Clinical staff led group instruction and group discussion with PowerPoint presentation and patient guidebook. To enhance the learning environment the use of posters, models and videos may be added.At the conclusion of this workshop, patients will understand the anatomy and physiology of the heart. Additionally, they will understand how Pritikin's three pillars impact the risk factors, the progression, and the management of heart disease.  The purpose of this lesson is to provide a high-level overview of the heart, heart  disease, and how the Pritikin lifestyle positively impacts risk factors.  Exercise Biomechanics Clinical staff led group instruction and group discussion with PowerPoint presentation and patient guidebook. To enhance the learning environment the use of posters, models and videos may be added. Patients will learn how the structural parts of their bodies function and how these functions impact their daily activities, movement, and exercise. Patients will learn how to promote a neutral spine, learn how to manage pain, and identify ways to improve their physical movement in order to promote healthy living. The purpose of this lesson is to expose patients to common physical limitations that impact physical activity. Participants will learn practical ways to adapt and manage aches and pains, and to minimize their effect on regular exercise. Patients will learn how to maintain good posture while sitting, walking, and lifting.  Balance Training and Fall Prevention  Clinical staff led group instruction and group discussion with PowerPoint presentation and patient guidebook. To enhance the learning environment the use of posters, models and videos may be added. At the conclusion of this workshop, patients will understand the importance of their sensorimotor skills (vision, proprioception, and the vestibular system) in maintaining their ability to balance as they age. Patients will apply a variety of balancing exercises that are appropriate for their current level of function. Patients will understand the common causes for poor balance, possible solutions to these problems, and ways to modify their physical environment in order to minimize their fall risk. The purpose of this lesson is to teach patients about the importance of maintaining balance as they age and ways to minimize their risk of falling.  WORKSHOPS   Nutrition:  Fueling a Ship broker led group instruction and group  discussion with PowerPoint presentation and patient guidebook. To enhance the learning environment the use of posters, models and videos may be added. Patients will review the foundational principles of the Pritikin Eating Plan and understand what constitutes a serving size in each of the food groups. Patients will also learn Pritikin-friendly foods that are better choices when away from home and review make-ahead meal and snack options. Calorie density will be reviewed and applied to three nutrition priorities: weight maintenance, weight loss, and weight gain. The purpose  of this lesson is to reinforce (in a group setting) the key concepts around what patients are recommended to eat and how to apply these guidelines when away from home by planning and selecting Pritikin-friendly options. Patients will understand how calorie density may be adjusted for different weight management goals.  Mindful Eating  Clinical staff led group instruction and group discussion with PowerPoint presentation and patient guidebook. To enhance the learning environment the use of posters, models and videos may be added. Patients will briefly review the concepts of the Pritikin Eating Plan and the importance of low-calorie dense foods. The concept of mindful eating will be introduced as well as the importance of paying attention to internal hunger signals. Triggers for non-hunger eating and techniques for dealing with triggers will be explored. The purpose of this lesson is to provide patients with the opportunity to review the basic principles of the Pritikin Eating Plan, discuss the value of eating mindfully and how to measure internal cues of hunger and fullness using the Hunger Scale. Patients will also discuss reasons for non-hunger eating and learn strategies to use for controlling emotional eating.  Targeting Your Nutrition Priorities Clinical staff led group instruction and group discussion with PowerPoint presentation and  patient guidebook. To enhance the learning environment the use of posters, models and videos may be added. Patients will learn how to determine their genetic susceptibility to disease by reviewing their family history. Patients will gain insight into the importance of diet as part of an overall healthy lifestyle in mitigating the impact of genetics and other environmental insults. The purpose of this lesson is to provide patients with the opportunity to assess their personal nutrition priorities by looking at their family history, their own health history and current risk factors. Patients will also be able to discuss ways of prioritizing and modifying the Pritikin Eating Plan for their highest risk areas  Menu  Clinical staff led group instruction and group discussion with PowerPoint presentation and patient guidebook. To enhance the learning environment the use of posters, models and videos may be added. Using menus brought in from [employer], or printed from [employer], patients will apply the Pritikin dining out guidelines that were presented in the [employer] video. Patients will also be able to practice these guidelines in a variety of provided scenarios. The purpose of this lesson is to provide patients with the opportunity to practice hands-on learning of the Pritikin Dining Out guidelines with actual menus and practice scenarios.  Label Reading Clinical staff led group instruction and group discussion with PowerPoint presentation and patient guidebook. To enhance the learning environment the use of posters, models and videos may be added. Patients will review and discuss the Pritikin label reading guidelines presented in Pritikin's Label Reading Educational series video. Using fool labels brought in from local grocery stores and markets, patients will apply the label reading guidelines and determine if the packaged food meet the Pritikin guidelines. The purpose of this  lesson is to provide patients with the opportunity to review, discuss, and practice hands-on learning of the Pritikin Label Reading guidelines with actual packaged food labels. Cooking School  Pritikin's [employer] are designed to teach patients ways to prepare quick, simple, and affordable recipes at home. The importance of nutrition's role in chronic disease risk reduction is reflected in its emphasis in the overall Pritikin program. By learning how to prepare essential core Pritikin Eating Plan recipes, patients will increase control over what they eat; be able  to customize the flavor of foods without the use of added salt, sugar, or fat; and improve the quality of the food they consume. By learning a set of core recipes which are easily assembled, quickly prepared, and affordable, patients are more likely to prepare more healthy foods at home. These workshops focus on convenient breakfasts, simple entres, side dishes, and desserts which can be prepared with minimal effort and are consistent with nutrition recommendations for cardiovascular risk reduction. Cooking [employer] are taught by a Armed forces logistics/support/administrative officer (RD) who has been trained by the [employer]. The chef or RD has a clear understanding of the importance of minimizing - if not completely eliminating - added fat, sugar, and sodium in recipes. Throughout the series of Cooking School Workshop sessions, patients will learn about healthy ingredients and efficient methods of cooking to build confidence in their capability to prepare    Cooking School weekly topics:  Adding Flavor- Sodium-Free  Fast and Healthy Breakfasts  Powerhouse Plant-Based Proteins  Satisfying Salads and Dressings  Simple Sides and Sauces  International Cuisine-Spotlight on the [employer] Zones  Delicious Desserts  Savory Soups  [employer] - Meals in a Astronomer Appetizers and Snacks  Comforting Weekend Breakfasts  One-Pot  Wonders   Fast Evening Meals  Landscape architect Your Pritikin Plate  WORKSHOPS   Healthy Mindset (Psychosocial):  Focused Goals, Sustainable Changes Clinical staff led group instruction and group discussion with PowerPoint presentation and patient guidebook. To enhance the learning environment the use of posters, models and videos may be added. Patients will be able to apply effective goal setting strategies to establish at least one personal goal, and then take consistent, meaningful action toward that goal. They will learn to identify common barriers to achieving personal goals and develop strategies to overcome them. Patients will also gain an understanding of how our mind-set can impact our ability to achieve goals and the importance of cultivating a positive and growth-oriented mind-set. The purpose of this lesson is to provide patients with a deeper understanding of how to set and achieve personal goals, as well as the tools and strategies needed to overcome common obstacles which may arise along the way.  From Head to Heart: The Power of a Healthy Outlook  Clinical staff led group instruction and group discussion with PowerPoint presentation and patient guidebook. To enhance the learning environment the use of posters, models and videos may be added. Patients will be able to recognize and describe the impact of emotions and mood on physical health. They will discover the importance of self-care and explore self-care practices which may work for them. Patients will also learn how to utilize the 4 C's to cultivate a healthier outlook and better manage stress and challenges. The purpose of this lesson is to demonstrate to patients how a healthy outlook is an essential part of maintaining good health, especially as they continue their cardiac rehab journey.  Healthy Sleep for a Healthy Heart Clinical staff led group instruction and group discussion with PowerPoint presentation and  patient guidebook. To enhance the learning environment the use of posters, models and videos may be added. At the conclusion of this workshop, patients will be able to demonstrate knowledge of the importance of sleep to overall health, well-being, and quality of life. They will understand the symptoms of, and treatments for, common sleep disorders. Patients will also be able to identify daytime and nighttime behaviors which impact sleep, and they will be able  to apply these tools to help manage sleep-related challenges. The purpose of this lesson is to provide patients with a general overview of sleep and outline the importance of quality sleep. Patients will learn about a few of the most common sleep disorders. Patients will also be introduced to the concept of "sleep hygiene," and discover ways to self-manage certain sleeping problems through simple daily behavior changes. Finally, the workshop will motivate patients by clarifying the links between quality sleep and their goals of heart-healthy living.   Recognizing and Reducing Stress Clinical staff led group instruction and group discussion with PowerPoint presentation and patient guidebook. To enhance the learning environment the use of posters, models and videos may be added. At the conclusion of this workshop, patients will be able to understand the types of stress reactions, differentiate between acute and chronic stress, and recognize the impact that chronic stress has on their health. They will also be able to apply different coping mechanisms, such as reframing negative self-talk. Patients will have the opportunity to practice a variety of stress management techniques, such as deep abdominal breathing, progressive muscle relaxation, and/or guided imagery.  The purpose of this lesson is to educate patients on the role of stress in their lives and to provide healthy techniques for coping with it.  Learning Barriers/Preferences:  Learning  Barriers/Preferences - 10/06/23 1301       Learning Barriers/Preferences   Learning Barriers Sight    Learning Preferences Computer/Internet;Pictoral;Video;Written Material          Education Topics:  Knowledge Questionnaire Score:  Knowledge Questionnaire Score - 10/06/23 1301       Knowledge Questionnaire Score   Pre Score 20/24          Core Components/Risk Factors/Patient Goals at Admission:  Personal Goals and Risk Factors at Admission - 10/06/23 1303       Core Components/Risk Factors/Patient Goals on Admission   Diabetes Yes    Intervention Provide education about signs/symptoms and action to take for hypo/hyperglycemia.;Provide education about proper nutrition, including hydration, and aerobic/resistive exercise prescription along with prescribed medications to achieve blood glucose in normal ranges: Fasting glucose 65-99 mg/dL    Expected Outcomes Short Term: Participant verbalizes understanding of the signs/symptoms and immediate care of hyper/hypoglycemia, proper foot care and importance of medication, aerobic/resistive exercise and nutrition plan for blood glucose control.;Long Term: Attainment of HbA1C < 7%.    Hypertension Yes    Intervention Provide education on lifestyle modifcations including regular physical activity/exercise, weight management, moderate sodium restriction and increased consumption of fresh fruit, vegetables, and low fat dairy, alcohol moderation, and smoking cessation.;Monitor prescription use compliance.    Expected Outcomes Short Term: Continued assessment and intervention until BP is < 140/88mm HG in hypertensive participants. < 130/69mm HG in hypertensive participants with diabetes, heart failure or chronic kidney disease.;Long Term: Maintenance of blood pressure at goal levels.    Lipids Yes    Intervention Provide education and support for participant on nutrition & aerobic/resistive exercise along with prescribed medications to achieve  LDL 70mg , HDL >40mg .    Expected Outcomes Short Term: Participant states understanding of desired cholesterol values and is compliant with medications prescribed. Participant is following exercise prescription and nutrition guidelines.;Long Term: Cholesterol controlled with medications as prescribed, with individualized exercise RX and with personalized nutrition plan. Value goals: LDL < 70mg , HDL > 40 mg.          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 Introduction to Pritikin Education Program/ Intensive cardiac rebab. Initial orientation packet reviewed with patient          Comments: Participant attended orientation for the cardiac rehabilitation program on  10/06/2023  to perform initial intake and exercise walk test. Patient introduced to the Pritikin Program education and orientation packet was reviewed. Completed 6-minute walk test, measurements, initial ITP, and exercise prescription. Vital signs stable. Telemetry-normal sinus rhythm, asymptomatic.   Service time was from 10:44 to 12:30

## 2023-10-06 NOTE — Progress Notes (Signed)
 Cardiac Rehab Medication Review   Does the patient  feel that his/her medications are working for him/her?  yes  Has the patient been experiencing any side effects to the medications prescribed?  no  Does the patient measure his/her own blood pressure or blood glucose at home?  yes   Does the patient have any problems obtaining medications due to transportation or finances?   no  Understanding of regimen: excellent Understanding of indications: excellent Potential of compliance: excellent    Comments: Pt has no questions regarding his medications today.    Alm Parkins 10/06/2023 11:04 AM

## 2023-10-07 ENCOUNTER — Telehealth: Payer: Self-pay | Admitting: Pharmacist

## 2023-10-07 NOTE — Telephone Encounter (Signed)
 Called pt and LVM to remind patient that he is due to fasting labs

## 2023-10-12 ENCOUNTER — Encounter (HOSPITAL_COMMUNITY): Admission: RE | Admit: 2023-10-12 | Source: Ambulatory Visit

## 2023-10-12 ENCOUNTER — Other Ambulatory Visit: Payer: Self-pay | Admitting: Physician Assistant

## 2023-10-12 ENCOUNTER — Telehealth (HOSPITAL_COMMUNITY): Payer: Self-pay

## 2023-10-12 NOTE — Telephone Encounter (Signed)
 Patient c/o sick for 12:30 CR class.

## 2023-10-19 ENCOUNTER — Encounter (HOSPITAL_COMMUNITY)
Admission: RE | Admit: 2023-10-19 | Discharge: 2023-10-19 | Disposition: A | Discharge status: Home / Self Care | Source: Ambulatory Visit | Attending: Cardiology | Admitting: Cardiology

## 2023-10-19 DIAGNOSIS — Z951 Presence of aortocoronary bypass graft: Secondary | ICD-10-CM | POA: Insufficient documentation

## 2023-10-19 DIAGNOSIS — I214 Non-ST elevation (NSTEMI) myocardial infarction: Secondary | ICD-10-CM | POA: Insufficient documentation

## 2023-10-19 NOTE — Progress Notes (Signed)
 Incomplete Session Note  Patient Details  Name: Mark [name] MRN: [id number] Date of Birth: 12-Dec-1950 Referring Provider:   Flowsheet Row INTENSIVE CARDIAC REHAB ORIENT from 10/06/2023 in [facility]  Referring Provider Peter Swaziland, MD    Mark [name] did not complete his rehab session.  He informed staff that he had not eaten breakfast nor lunch.  N/S informed pt that he has to eat before coming to CR, as exercise can significantly drop his BGL.

## 2023-10-20 LAB — GLUCOSE, CAPILLARY: Glucose-Capillary: 191 mg/dL — ABNORMAL HIGH (ref 70–99)

## 2023-10-24 ENCOUNTER — Telehealth (HOSPITAL_COMMUNITY): Payer: Self-pay

## 2023-10-24 ENCOUNTER — Encounter (HOSPITAL_COMMUNITY)

## 2023-10-24 NOTE — Telephone Encounter (Signed)
 VM left to inquire about Mark [name]'s lack of attendance at CR since his orientation.

## 2023-10-24 NOTE — Telephone Encounter (Signed)
 VM left with son to inquire about Reace's lack of attendance at CR since his orientation.

## 2023-10-24 NOTE — Telephone Encounter (Signed)
 Called pt's daughter to inquire about Mark [name]'s lack of attendance at CR since his orientation.

## 2023-10-25 ENCOUNTER — Encounter (HOSPITAL_COMMUNITY): Payer: Self-pay

## 2023-10-25 ENCOUNTER — Telehealth (HOSPITAL_COMMUNITY): Payer: Self-pay | Admitting: *Deleted

## 2023-10-25 NOTE — Telephone Encounter (Signed)
 Mark [name] left a message on the department voicemail stating that he will need to discharge from the cardiac rehab program at this time. He's having a lot of physical problems/ chronic constipation and cannot attend the program with these issues ongoing. He would be interested in coming in the next quarter. His appointments have been cancelled and patient's care team and scheduling team notified.

## 2023-10-25 NOTE — Progress Notes (Signed)
 Discharge Progress Report  Patient Details  Name: Mark [name] MRN: [id number] Date of Birth: August 25, 1950 Referring Provider:   Flowsheet Row INTENSIVE CARDIAC REHAB ORIENT from 10/06/2023 in [facility]  Referring Provider Peter Swaziland, MD     Number of Visits: 2  Reason for Discharge:  Early Exit:  Personal  Per telephone encounter on 10/25/23 @ 0651: Mr. Jungwirth left a message on the department voicemail stating that he will need to discharge from the cardiac rehab program at this time. He's having a lot of physical problems/ chronic constipation and cannot attend the program with these issues ongoing. He would be interested in coming in the next quarter. His appointments have been cancelled and patient's care team and scheduling team notified.   Pt has not been able to attend since his CR orientation on 10/06/23.  Smoking History:  Social History   Tobacco Use  Smoking Status Never  Smokeless Tobacco Never    Diagnosis:  No diagnosis found.  ADL UCSD:   Initial Exercise Prescription:  Initial Exercise Prescription - 10/06/23 1300       Date of Initial Exercise RX and Referring Provider   Date 10/06/23    Referring Provider Peter Swaziland, MD    Expected Discharge Date 12/28/23      NuStep   Level 2    SPM 75    Minutes 15    METs 2.8      Track   Laps 10    Minutes 15    METs 2.8      Prescription Details   Frequency (times per week) 2    Duration Progress to 30 minutes of continuous aerobic without signs/symptoms of physical distress      Intensity   THRR 40-80% of Max Heartrate 59-118    Ratings of Perceived Exertion 11-13    Perceived Dyspnea 0-4      Progression   Progression Continue progressive overload as per policy without signs/symptoms or physical distress.      Resistance Training   Training Prescription Yes    Weight 3 lbs    Reps 10-15          Discharge Exercise Prescription  (Final Exercise Prescription Changes):   Functional Capacity:  6 Minute Walk     Row Name 10/06/23 1327         6 Minute Walk   Phase Initial     Distance 1243 feet     Walk Time 6 minutes     # of Rest Breaks 0     MPH 2.4     METS 2.8     RPE 11     Perceived Dyspnea  1     VO2 Peak 9.7     Symptoms Yes (comment)     Comments chronic bilateral foot pain 6/10, mild SOB, RPD =1, resolved with rest     Resting HR 74 bpm     Resting BP 120/68     Resting Oxygen Saturation  99 %     Exercise Oxygen Saturation  during 6 min walk 95 %     Max Ex. HR 101 bpm     Max Ex. BP 130/64     2 Minute Post BP 120/76        Psychological, QOL, Others - Outcomes: PHQ 2/9:    10/06/2023    1:09 PM  Depression screen PHQ 2/9  Decreased Interest 1  Down,  Depressed, Hopeless 0  PHQ - 2 Score 1  Altered sleeping 1  Tired, decreased energy 1  Change in appetite 2  Feeling bad or failure about yourself  0  Trouble concentrating 0  Moving slowly or fidgety/restless 0  Suicidal thoughts 0  PHQ-9 Score 5  Difficult doing work/chores Somewhat difficult    Quality of Life:  Quality of Life - 10/06/23 1300       Quality of Life   Select Quality of Life      Quality of Life Scores   Health/Function Pre 26.46 %    Socioeconomic Pre 26.25 %    Psych/Spiritual Pre 27.5 %    Family Pre 30 %    GLOBAL Pre 27.11 %          Personal Goals: Goals established at orientation with interventions provided to work toward goal.  Personal Goals and Risk Factors at Admission - 10/06/23 1303       Core Components/Risk Factors/Patient Goals on Admission   Diabetes Yes    Intervention Provide education about signs/symptoms and action to take for hypo/hyperglycemia.;Provide education about proper nutrition, including hydration, and aerobic/resistive exercise prescription along with prescribed medications to achieve blood glucose in normal ranges: Fasting glucose 65-99 mg/dL    Expected  Outcomes Short Term: Participant verbalizes understanding of the signs/symptoms and immediate care of hyper/hypoglycemia, proper foot care and importance of medication, aerobic/resistive exercise and nutrition plan for blood glucose control.;Long Term: Attainment of HbA1C < 7%.    Hypertension Yes    Intervention Provide education on lifestyle modifcations including regular physical activity/exercise, weight management, moderate sodium restriction and increased consumption of fresh fruit, vegetables, and low fat dairy, alcohol moderation, and smoking cessation.;Monitor prescription use compliance.    Expected Outcomes Short Term: Continued assessment and intervention until BP is < 140/69mm HG in hypertensive participants. < 130/104mm HG in hypertensive participants with diabetes, heart failure or chronic kidney disease.;Long Term: Maintenance of blood pressure at goal levels.    Lipids Yes    Intervention Provide education and support for participant on nutrition & aerobic/resistive exercise along with prescribed medications to achieve LDL 70mg , HDL >40mg .    Expected Outcomes Short Term: Participant states understanding of desired cholesterol values and is compliant with medications prescribed. Participant is following exercise prescription and nutrition guidelines.;Long Term: Cholesterol controlled with medications as prescribed, with individualized exercise RX and with personalized nutrition plan. Value goals: LDL < 70mg , HDL > 40 mg.           Personal Goals Discharge:   Exercise Goals and Review:  Exercise Goals     Row Name 10/06/23 1158             Exercise Goals   Increase Physical Activity Yes       Intervention Provide advice, education, support and counseling about physical activity/exercise needs.;Develop an individualized exercise prescription for aerobic and resistive training based on initial evaluation findings, risk stratification, comorbidities and participant's personal  goals.       Expected Outcomes Short Term: Attend rehab on a regular basis to increase amount of physical activity.;Long Term: Exercising regularly at least 3-5 days a week.;Long Term: Add in home exercise to make exercise part of routine and to increase amount of physical activity.       Increase Strength and Stamina Yes       Intervention Provide advice, education, support and counseling about physical activity/exercise needs.;Develop an individualized exercise prescription for aerobic and resistive training based on  initial evaluation findings, risk stratification, comorbidities and participant's personal goals.       Expected Outcomes Short Term: Increase workloads from initial exercise prescription for resistance, speed, and METs.;Short Term: Perform resistance training exercises routinely during rehab and add in resistance training at home;Long Term: Improve cardiorespiratory fitness, muscular endurance and strength as measured by increased METs and functional capacity ( )       Able to understand and use rate of perceived exertion (RPE) scale Yes       Intervention Provide education and explanation on how to use RPE scale       Expected Outcomes Short Term: Able to use RPE daily in rehab to express subjective intensity level;Long Term:  Able to use RPE to guide intensity level when exercising independently       Knowledge and understanding of Target Heart Rate Range (THRR) Yes       Intervention Provide education and explanation of THRR including how the numbers were predicted and where they are located for reference       Expected Outcomes Short Term: Able to state/look up THRR;Long Term: Able to use THRR to govern intensity when exercising independently;Short Term: Able to use daily as guideline for intensity in rehab       Understanding of Exercise Prescription Yes       Intervention Provide education, explanation, and written materials on patient's individual exercise prescription        Expected Outcomes Short Term: Able to explain program exercise prescription;Long Term: Able to explain home exercise prescription to exercise independently          Exercise Goals Re-Evaluation:   Nutrition & Weight - Outcomes:  Pre Biometrics - 10/06/23 1125       Pre Biometrics   Waist Circumference 38 inches    Hip Circumference 41.5 inches    Waist to Hip Ratio 0.92 %    Triceps Skinfold 28 mm    % Body Fat 28.6 %    Grip Strength 20 kg    Flexibility 0 in    Single Leg Stand 2.2 seconds           Nutrition:   Nutrition Discharge:   Education Questionnaire Score:  Knowledge Questionnaire Score - 10/06/23 1301       Knowledge Questionnaire Score   Pre Score 20/24         Per telephone encounter on 10/25/23 @ 0651: Mr. Requena left a message on the department voicemail stating that he will need to discharge from the cardiac rehab program at this time. He's having a lot of physical problems/ chronic constipation and cannot attend the program with these issues ongoing. He would be interested in coming in the next quarter. His appointments have been cancelled and patient's care team and scheduling team notified.  Pt has not been able to attend since his CR orientation on 10/06/23.  Goals reviewed with patient; copy given to patient.

## 2023-10-26 ENCOUNTER — Encounter (HOSPITAL_COMMUNITY)

## 2023-10-28 DIAGNOSIS — Z936 Other artificial openings of urinary tract status: Secondary | ICD-10-CM | POA: Diagnosis not present

## 2023-10-31 ENCOUNTER — Encounter (HOSPITAL_COMMUNITY)

## 2023-11-02 ENCOUNTER — Encounter (HOSPITAL_COMMUNITY)

## 2023-11-07 ENCOUNTER — Encounter (HOSPITAL_COMMUNITY)

## 2023-11-09 ENCOUNTER — Encounter (HOSPITAL_COMMUNITY)

## 2023-11-14 ENCOUNTER — Ambulatory Visit: Attending: Emergency Medicine | Admitting: Emergency Medicine

## 2023-11-14 ENCOUNTER — Encounter: Payer: Self-pay | Admitting: Emergency Medicine

## 2023-11-14 ENCOUNTER — Ambulatory Visit

## 2023-11-14 VITALS — BP 104/64 | HR 78 | Ht 66.5 in | Wt 153.0 lb

## 2023-11-14 DIAGNOSIS — I2510 Atherosclerotic heart disease of native coronary artery without angina pectoris: Secondary | ICD-10-CM | POA: Diagnosis not present

## 2023-11-14 DIAGNOSIS — Z951 Presence of aortocoronary bypass graft: Secondary | ICD-10-CM | POA: Diagnosis not present

## 2023-11-14 DIAGNOSIS — E7800 Pure hypercholesterolemia, unspecified: Secondary | ICD-10-CM | POA: Diagnosis not present

## 2023-11-14 DIAGNOSIS — I10 Essential (primary) hypertension: Secondary | ICD-10-CM | POA: Diagnosis not present

## 2023-11-14 DIAGNOSIS — I959 Hypotension, unspecified: Secondary | ICD-10-CM

## 2023-11-14 DIAGNOSIS — I214 Non-ST elevation (NSTEMI) myocardial infarction: Secondary | ICD-10-CM | POA: Diagnosis not present

## 2023-11-14 DIAGNOSIS — I480 Paroxysmal atrial fibrillation: Secondary | ICD-10-CM

## 2023-11-14 DIAGNOSIS — E785 Hyperlipidemia, unspecified: Secondary | ICD-10-CM | POA: Diagnosis not present

## 2023-11-14 LAB — LIPID PANEL

## 2023-11-14 MED ORDER — MIDODRINE HCL 5 MG PO TABS: 5.0000 mg | ORAL_TABLET | Freq: Three times a day (TID) | ORAL | 1 refills | Status: AC | PRN

## 2023-11-14 NOTE — Progress Notes (Unsigned)
Enrolled for [employer] to mail a ZIO XT long term holter monitor to the patients address on file.   Dr. Jordan to read. 

## 2023-11-14 NOTE — Patient Instructions (Signed)
 Medication Instructions:  STOP TAKING AMIODARONE . RESTART MIDODRINE  5 MG 3 TIMES DAILY AS NEEDED.  Lab Work: FASTING LIPID PANEL AND CMET TO BE DONE TODAY.   Testing/Procedures: GEOFFRY HEWS- Long Term Monitor Instructions  Your physician has requested you wear a ZIO patch monitor for 14 days.  This is a single patch monitor. [employer] supplies one patch monitor per enrollment. Additional stickers are not available. Please do not apply patch if you will be having a Nuclear Stress Test,  Echocardiogram, Cardiac CT, MRI, or Chest Xray during the period you would be wearing the  monitor. The patch cannot be worn during these tests. You cannot remove and re-apply the  ZIO XT patch monitor.  Your ZIO patch monitor will be mailed 3 day USPS to your address on file. It may take 3-5 days  to receive your monitor after you have been enrolled.  Once you have received your monitor, please review the enclosed instructions. Your monitor  has already been registered assigning a specific monitor serial # to you.  Billing and Patient Assistance Program Information  We have supplied [employer] with any of your insurance information on file for billing purposes. [employer] offers a sliding scale Patient Assistance Program for patients that do not have  insurance, or whose insurance does not completely cover the cost of the ZIO monitor.  You must apply for the Patient Assistance Program to qualify for this discounted rate.  To apply, please call [employer] at [phone], select option 4, select option 2, ask to apply for  Patient Assistance Program. Meredeth will ask your household income, and how many people  are in your household. They will quote your out-of-pocket cost based on that information.  [employer] will also be able to set up a 12-month, interest-free payment plan if needed.  Applying the monitor   Shave hair from upper left chest.  Hold abrader disc by orange tab. Rub abrader in 40 strokes over the upper  left chest as  indicated in your monitor instructions.  Clean area with 4 enclosed alcohol pads. Let dry.  Apply patch as indicated in monitor instructions. Patch will be placed under collarbone on left  side of chest with arrow pointing upward.  Rub patch adhesive wings for 2 minutes. Remove white label marked 1. Remove the white  label marked 2. Rub patch adhesive wings for 2 additional minutes.  While looking in a mirror, press and release button in center of patch. A small green light will  flash 3-4 times. This will be your only indicator that the monitor has been turned on.  Do not shower for the first 24 hours. You may shower after the first 24 hours.  Press the button if you feel a symptom. You will hear a small click. Record Date, Time and  Symptom in the Patient Logbook.  When you are ready to remove the patch, follow instructions on the last 2 pages of Patient  Logbook. Stick patch monitor onto the last page of Patient Logbook.  Place Patient Logbook in the blue and white box. Use locking tab on box and tape box closed  securely. The blue and white box has prepaid postage on it. Please place it in the mailbox as  soon as possible. Your physician should have your test results approximately 7 days after the  monitor has been mailed back to [facility].  Call [facility] Customer Care at [phone] if you have questions regarding  your ZIO XT patch monitor. Call them immediately if  you see an orange light blinking on your  monitor.  If your monitor falls off in less than 4 days, contact our Monitor department at [phone].  If your monitor becomes loose or falls off after 4 days call [employer] at [phone] for  suggestions on securing your monitor   Follow-Up: At [facility], you and your health needs are our priority.  As part of our continuing mission to provide you with exceptional heart care, our providers are all part of one team.  This team  includes your primary Cardiologist (physician) and Advanced Practice Providers or APPs (Physician Assistants and Nurse Practitioners) who all work together to provide you with the care you need, when you need it.  Your next appointment:   3 MONTHS  Provider:   Peter Swaziland, MD OR Lum Louis, NP

## 2023-11-14 NOTE — Progress Notes (Signed)
 Cardiology Office Note:    Date:  11/14/2023  ID:  Yancy FORBES Lesches, DOB 03-29-1950, MRN [id number] PCP: Leonel Cole, MD  [facility] Providers Cardiologist:  Peter Swaziland, MD Cardiology APP:  Rana Lum CROME, NP       Patient Profile:       Chief Complaint: 31-month follow-up History of Present Illness:  EWIN REHBERG is a 73 y.o. male with visit-pertinent history of atrial fibrillation, bladder cancer post urostomy, CKD, colon cancer, GERD, hypertension, hyperlipidemia   He was seen by cardiology service during admission on 07/16/2023 for chest pain.  He underwent coronary CTA on 07/16/2023 showing coronary calcium  score of 891 (79th percentile).  FFR showed significant stenosis either at the distal left main or ostial LAD/LCx bifurcation.  Echocardiogram 07/18/2023 showed LVEF 55 to 60%, no RWMA, mild mitral valve regurgitation.  Cardiac catheterization was completed on 07/19/2023 showing 65% distal left main lesion with mild diffuse disease of LAD and left circumflex.  Moderate diffuse disease of the right coronary artery.  He underwent 2V CABG on 6/6 with LIMA-LAD, SVG-OM.  He developed atrial fibrillation during CABG and treated with IV amiodarone .  He converted to NSR and was transitioned to oral regimen.  He was started on midodrine  due to vasoplegia.  His home BP meds were held at discharge.  He was discharged home in NSR.  He was seen for follow-up in office on 08/09/2023.  He was doing well without anginal symptoms.  His blood pressure had improved and his midodrine  was changed to as needed.  He was maintaining NSR and denied any symptoms concerning for recurrent atrial fibrillation.   Discussed the use of AI scribe software for clinical note transcription with the patient, who gave verbal consent to proceed.  History of Present Illness TARIQUE LOVEALL is a 73 year old male who presents for 41-month follow-up.  Today patient is doing well overall.  He does experience some  lightheadedness, particularly when standing quickly, which has improved over the past 5 to 6 weeks.  He reported an episode of presyncope 2 months ago after standing up too quickly.  He monitors his blood pressure at home, averaging 110/70 mmHg.  He has midodrine  as needed but feels as if he is not had to use it.  He denies any chest pains.  He denies his prior anginal equivalent.  He never followed through with cardiac rehab.   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 He experiences constipation, managed with Linzess and laxatives, and has a decreased appetite, eating two meals daily.  No chest pain, palpitations, or symptoms to suggest recurrent atrial fibrillation.  He takes Plavix , baby aspirin , rosuvastatin , and Zetia . He aims to return to cardiac rehab. He considers resuming exercise through the [employer] program.   Review of systems:  Please see the history of present illness. All other systems are reviewed and otherwise negative.      Studies Reviewed:    EKG Interpretation Date/Time:  Monday November 14 2023 13:50:49 EDT Ventricular Rate:  79 PR Interval:  184 QRS Duration:  140 QT Interval:  442 QTC Calculation: 506 R Axis:   -25  Text Interpretation: Normal sinus rhythm Incomplete left bundle branch block When compared with ECG of 09-Aug-2023 14:39, PR interval has decreased Confirmed by Rana Lum [phone]) on 11/14/2023 2:04:36 PM    Echocardiogram 07/18/2023 1. Left ventricular ejection fraction, by estimation, is 55 to  60%. Left  ventricular ejection fraction by 2D MOD biplane is 55.2 %. The left  ventricle has normal function. The left ventricle has no regional wall  motion abnormalities. Left ventricular  diastolic parameters were normal.   2. Peak RV-RA gradient 17 mmHg. Right ventricular systolic function is  normal. The right ventricular size is normal.   3. Left  atrial size was mildly dilated.   4. The mitral valve is normal in structure. Mild mitral valve  regurgitation. No evidence of mitral stenosis.   5. The aortic valve is tricuspid. There is mild calcification of the  aortic valve. Aortic valve regurgitation is trivial. No aortic stenosis is  present.   6. IVC not visualized.    Cardiac catheterization 07/19/2023   Dist LM to Ost LAD lesion is 65% stenosed.   1.  65% distal left main lesion with mild diffuse disease of the LAD and left circumflex. 2.  Moderate diffuse disease of the right coronary artery; a pressure wire assessment was performed to assess the hemodynamic significance of this burden of coronary atherosclerosis.  The RFR was 0.96 with a pressure wire position in the right posterolateral ventricular branch consistent with hemodynamically insignificant disease. 3.  LVEDP of 14 mmHg Diagnostic Dominance: Right   Risk Assessment/Calculations:    CHA2DS2-VASc Score = 2   This indicates a 2.2% annual risk of stroke. The patient's score is based upon: CHF History: 0 HTN History: 1 Diabetes History: 0 Stroke History: 0 Vascular Disease History: 0 Age Score: 1 Gender Score: 0             Physical Exam:   VS:  BP 104/64 (BP Location: Left Arm, Patient Position: Sitting, Cuff Size: Normal)   Pulse 78   Ht 5' 6.5 (1.689 m)   Wt 153 lb (69.4 kg)   BMI 24.32 kg/m    Wt Readings from Last 3 Encounters:  11/14/23 153 lb (69.4 kg)  10/06/23 158 lb 4.6 oz (71.8 kg)  09/01/23 157 lb (71.2 kg)    GEN: Well nourished, well developed in no acute distress NECK: No JVD; No carotid bruits CARDIAC: RRR, no murmurs, rubs, gallops RESPIRATORY:  Clear to auscultation without rales, wheezing or rhonchi  ABDOMEN: Soft, non-tender, non-distended EXTREMITIES:  No edema; No acute deformity      Assessment and Plan:  Coronary artery disease NSTEMI S/p CABG x 2 utilizing LIMA to LAD and SVG to OM on 07/2023 LHC 07/2023 showed distal  LM to ostial LAD with 65% stenosis Echocardiogram 07/18/2023 showed LV EF 55 to 60% - EKG today without acute ischemic changes - Today he remains stable without anginal symptoms.  Denies exertional chest pains and prior anginal equivalent.  No indication for further ischemic evaluation at this time - Developed vasoplegia postop and managed on midodrine  3 times daily as needed.  Blood pressure has improved but he will occasionally get lightheaded on position changes which seems to have improved over the past 5-6 weeks.  He has not used his midodrine , I have educated him on appropriate as needed use today to prevent orthostatic hypotension - No beta-blocker due to labile BP in hospital - Encouraged adequate hydration - Continue DAPT with aspirin  81 mg daily and clopidogrel  75 mg daily - Continue rosuvastatin  40 mg daily and ezetimibe  10 mg daily   Paroxysmal atrial fibrillation Remote history in 2000 had been on OAC in the past however was discontinued due to lack of recurrent episode - PostOp CABG 07/2023 he developed atrial  fibrillation and was treated with IV amiodarone  and converted to NSR.  DC'd on oral amiodarone  - EKG today shows patient is maintaining NSR - He denies any symptoms concerning for recurrent atrial fibrillation.  Does monitor rhythm on smart watch - He has completed x 3 months of amiodarone  therapy.  I will discontinue today given he is maintaining NSR - Plan for 14-day ZIO to confirm no atrial fibrillation recurrence   Hyperlipidemia, LDL goal <55 Lipoprotein (a) 15.3 LDL 85, TG 259 on 07/2023 LDL currently not well-controlled however started on cholesterol lowering therapy 07/2023 - Plan for repeat fasting lipid panel/LFTs today - Continue rosuvastatin  40 mg daily and ezetimibe  10 mg daily   Hypertension Blood pressure today is well-controlled at 104/64 - Developed vasoplegia postop CABG and PTA antihypertensives were held - Notes occasional lightheadedness on positional  changes which have improved over the past 5 to 6 weeks - Continue to monitor blood pressure closely at home - Continue midodrine  5 mg 3 times daily as needed       Dispo:  Return in about 3 months (around 02/13/2024).  Signed, Lum LITTIE Louis, NP

## 2023-11-15 LAB — COMPREHENSIVE METABOLIC PANEL WITH GFR
ALT: 29 IU/L (ref 0–44)
AST: 44 IU/L — ABNORMAL HIGH (ref 0–40)
Albumin: 4.2 g/dL (ref 3.8–4.8)
Alkaline Phosphatase: 150 IU/L — ABNORMAL HIGH (ref 47–123)
BUN/Creatinine Ratio: 12 (ref 10–24)
BUN: 20 mg/dL (ref 8–27)
Bilirubin Total: 0.5 mg/dL (ref 0.0–1.2)
CO2: 19 mmol/L — ABNORMAL LOW (ref 20–29)
Calcium: 9.7 mg/dL (ref 8.6–10.2)
Chloride: 98 mmol/L (ref 96–106)
Creatinine, Ser: 1.67 mg/dL — ABNORMAL HIGH (ref 0.76–1.27)
Globulin, Total: 3.8 g/dL (ref 1.5–4.5)
Glucose: 95 mg/dL (ref 70–99)
Potassium: 4.1 mmol/L (ref 3.5–5.2)
Sodium: 135 mmol/L (ref 134–144)
Total Protein: 8 g/dL (ref 6.0–8.5)
eGFR: 43 mL/min/1.73 — ABNORMAL LOW (ref >59)

## 2023-11-15 LAB — LIPID PANEL
Cholesterol, Total: 84 mg/dL — AB (ref 100–199)
HDL: 27 mg/dL — AB (ref >39)
LDL CALC COMMENT:: 3.1 ratio (ref 0.0–5.0)
LDL Chol Calc (NIH): 31 mg/dL (ref 0–99)
Triglycerides: 151 mg/dL — AB (ref 0–149)
VLDL Cholesterol Cal: 26 mg/dL (ref 5–40)

## 2023-11-16 ENCOUNTER — Encounter (HOSPITAL_COMMUNITY)

## 2023-11-17 ENCOUNTER — Ambulatory Visit: Payer: Self-pay | Admitting: Emergency Medicine

## 2023-11-17 DIAGNOSIS — Z79899 Other long term (current) drug therapy: Secondary | ICD-10-CM

## 2023-11-21 ENCOUNTER — Encounter (HOSPITAL_COMMUNITY)

## 2023-11-23 ENCOUNTER — Encounter (HOSPITAL_COMMUNITY)

## 2023-11-25 ENCOUNTER — Telehealth: Payer: Self-pay | Admitting: Emergency Medicine

## 2023-11-25 NOTE — Telephone Encounter (Signed)
 Pt received Heart monitor today and would like a c/b to assist either by phone or by coming in for an appt please advise

## 2023-11-28 ENCOUNTER — Encounter (HOSPITAL_COMMUNITY)

## 2023-11-28 NOTE — Telephone Encounter (Signed)
 Patient will comet to the office on 11/29/23 @ 9am to have monitor applied

## 2023-11-29 ENCOUNTER — Encounter (HOSPITAL_COMMUNITY): Attending: Cardiology

## 2023-11-29 DIAGNOSIS — Z951 Presence of aortocoronary bypass graft: Secondary | ICD-10-CM | POA: Insufficient documentation

## 2023-11-29 DIAGNOSIS — I214 Non-ST elevation (NSTEMI) myocardial infarction: Secondary | ICD-10-CM | POA: Insufficient documentation

## 2023-11-30 ENCOUNTER — Encounter (HOSPITAL_COMMUNITY)

## 2023-12-05 ENCOUNTER — Encounter (HOSPITAL_COMMUNITY)

## 2023-12-07 ENCOUNTER — Encounter (HOSPITAL_COMMUNITY)

## 2023-12-12 ENCOUNTER — Encounter (HOSPITAL_COMMUNITY)

## 2023-12-14 ENCOUNTER — Encounter (HOSPITAL_COMMUNITY)

## 2023-12-19 ENCOUNTER — Encounter (HOSPITAL_COMMUNITY)

## 2023-12-21 ENCOUNTER — Encounter (HOSPITAL_COMMUNITY)

## 2023-12-26 ENCOUNTER — Encounter (HOSPITAL_COMMUNITY)

## 2023-12-28 ENCOUNTER — Encounter (HOSPITAL_COMMUNITY)

## 2024-01-02 ENCOUNTER — Encounter (HOSPITAL_COMMUNITY)

## 2024-01-19 DIAGNOSIS — I480 Paroxysmal atrial fibrillation: Secondary | ICD-10-CM | POA: Diagnosis not present

## 2024-01-20 DIAGNOSIS — I480 Paroxysmal atrial fibrillation: Secondary | ICD-10-CM

## 2024-01-24 DIAGNOSIS — H52221 Regular astigmatism, right eye: Secondary | ICD-10-CM | POA: Diagnosis not present

## 2024-01-25 ENCOUNTER — Encounter: Payer: Self-pay | Admitting: *Deleted

## 2024-01-25 DIAGNOSIS — N183 Chronic kidney disease, stage 3 unspecified: Secondary | ICD-10-CM | POA: Diagnosis not present

## 2024-01-25 DIAGNOSIS — E038 Other specified hypothyroidism: Secondary | ICD-10-CM | POA: Diagnosis not present

## 2024-01-25 DIAGNOSIS — R7303 Prediabetes: Secondary | ICD-10-CM | POA: Diagnosis not present

## 2024-01-25 DIAGNOSIS — I2510 Atherosclerotic heart disease of native coronary artery without angina pectoris: Secondary | ICD-10-CM | POA: Diagnosis not present

## 2024-01-25 DIAGNOSIS — E782 Mixed hyperlipidemia: Secondary | ICD-10-CM | POA: Diagnosis not present

## 2024-02-01 ENCOUNTER — Telehealth: Payer: Self-pay | Admitting: Cardiology

## 2024-02-01 NOTE — Telephone Encounter (Signed)
 Pt c/o medication issue:  1. Name of Medication: amiodarone  (PACERONE ) 200 MG tablet [[id number]]  DISCONTINUED   2. How are you currently taking this medication (dosage and times per day)? None   3. Are you having a reaction (difficulty breathing--STAT)? No   4. What is your medication issue? Pt called requesting amiodarone  refill, but medication was marked as discontinued. Pt requesting c/b to know if he should be taking or not . Please advise.

## 2024-02-01 NOTE — Telephone Encounter (Signed)
 Spoke with pt, according to the notes in his chart, he is not supposed to be taking amiodarone .

## 2024-02-01 NOTE — Telephone Encounter (Signed)
°*  STAT* If patient is at the pharmacy, call can be transferred to refill team.   1. Which medications need to be refilled? (please list name of each medication and dose if known) rosuvastatin  (CRESTOR ) 40 MG tablet   ezetimibe  (ZETIA ) 10 MG tablet     2. Would you like to learn more about the convenience, safety, & potential cost savings by using the [facility]? No    3. Are you open to using the [facility] (Type [facility]. No    4. Which pharmacy/location (including street and city if local pharmacy) is medication to be sent to?[id number] - [place], [place] - [address] AT [facility] [address]    5. Do they need a 30 day or 90 day supply? 90 day   Pt is out of medication.

## 2024-02-02 ENCOUNTER — Telehealth: Payer: Self-pay | Admitting: Cardiology

## 2024-02-02 MED ORDER — EZETIMIBE 10 MG PO TABS: 10.0000 mg | ORAL_TABLET | Freq: Every day | ORAL | 3 refills | Status: AC

## 2024-02-02 MED ORDER — ROSUVASTATIN CALCIUM 40 MG PO TABS: 40.0000 mg | ORAL_TABLET | Freq: Every day | ORAL | 3 refills | Status: AC

## 2024-02-02 NOTE — Telephone Encounter (Signed)
 Refill sent

## 2024-02-02 NOTE — Telephone Encounter (Signed)
 [facility] states pt took form for cleaning to us  and was told we did not need it. They are requesting documentation of this be sent to [email or web address] . Please advise. Pt appt. Today 12/18 at 1:30.

## 2024-02-02 NOTE — Telephone Encounter (Signed)
 Pre-op team,   Will you please get official request from dental office?  Thank you!  DW

## 2024-02-02 NOTE — Telephone Encounter (Signed)
 Spoke to Megan at [employer] and she asked if he could be cleared for his appointment this afternoon since Dr. Charlott told him he could proceed. I informed her that the patient was on blood thinners and we need to give clearance from our end. She told me that she would call the patient and let him know that clearance would be sent over to our office. Awaiting fax to be sent to pre-op now.

## 2024-02-03 NOTE — Telephone Encounter (Signed)
" ° °  Patient Name: Mark [name]  DOB: 03-10-1950 MRN: [id number]  Primary Cardiologist: Peter Jordan, MD  Chart reviewed as part of pre-operative protocol coverage. Pre-op clearance already addressed by colleagues in earlier phone notes. To summarize recommendations:  -I would be OK with holding Plavix  but I would continue ASA. Generally not necessary to hold antiplatelet therapy for planned procedure. Peter Jordan MD, [facility]  -Waiting office visit 12/22 for medical clearance.   Will route this bundled recommendation to requesting provider via Epic fax function and remove from pre-op pool. Please call with questions.  Mardy KATHEE Pizza, FNP 02/03/2024, 2:38 PM  "

## 2024-02-03 NOTE — Telephone Encounter (Signed)
 Called [facility] office to try to obtain the needed procedure information for preop clearance. I have not seen a fax come over yet this morning.       Pre-operative Risk Assessment    Patient Name: Mark [name]  DOB: 09/27/1950 MRN: [id number]   Date of last office visit: 11/14/23 MADISON FOUNTAIN, NP Date of next office visit: 02/06/24 LUM LOUIS, NP; 3 MONTH F/U  Request for Surgical Clearance    Procedure:  2 FILLINGS AND A DEEP DENTAL CLEANING TO BE DONE  Date of Surgery:  Clearance TBD                                Surgeon:  DR. KARLEEN Surgeon's Group or Practice Name:  [facility] Phone number:  [phone] Fax number:  [phone]   Type of Clearance Requested:   - Medical  - Pharmacy:  Hold Aspirin  and Clopidogrel  (Plavix )     Type of Anesthesia:  Local    Additional requests/questions:    Bonney Niels Jest   02/03/2024, 9:26 AM

## 2024-02-03 NOTE — Telephone Encounter (Signed)
 Pt has not had his procedure yet. I s/w the [facility] office this morning to get the preop information.

## 2024-02-06 ENCOUNTER — Ambulatory Visit: Attending: Emergency Medicine | Admitting: Emergency Medicine

## 2024-02-06 ENCOUNTER — Encounter: Payer: Self-pay | Admitting: Emergency Medicine

## 2024-02-06 VITALS — BP 112/62 | HR 88 | Ht 66.5 in | Wt 159.0 lb

## 2024-02-06 DIAGNOSIS — E785 Hyperlipidemia, unspecified: Secondary | ICD-10-CM

## 2024-02-06 DIAGNOSIS — I480 Paroxysmal atrial fibrillation: Secondary | ICD-10-CM | POA: Diagnosis not present

## 2024-02-06 DIAGNOSIS — I10 Essential (primary) hypertension: Secondary | ICD-10-CM | POA: Diagnosis not present

## 2024-02-06 DIAGNOSIS — Z951 Presence of aortocoronary bypass graft: Secondary | ICD-10-CM | POA: Diagnosis not present

## 2024-02-06 DIAGNOSIS — I2510 Atherosclerotic heart disease of native coronary artery without angina pectoris: Secondary | ICD-10-CM

## 2024-02-06 NOTE — Patient Instructions (Addendum)
 Medication Instructions:  NO CHANGES  Lab Work: NONE TO BE DONE TODAY.  Testing/Procedures: NONE  Follow-Up: At [facility], you and your health needs are our priority.  As part of our continuing mission to provide you with exceptional heart care, our providers are all part of one team.  This team includes your primary Cardiologist (physician) and Advanced Practice Providers or APPs (Physician Assistants and Nurse Practitioners) who all work together to provide you with the care you need, when you need it.  Your next appointment:   6 MONTHS  Provider:   Peter Sw[place], MD OR Lum Louis, NP

## 2024-02-06 NOTE — Progress Notes (Signed)
 " Cardiology Office Note:    Date:  02/06/2024  ID:  Mark [name], DOB Jan 15, 1951, MRN [id number] PCP: Leonel Cole, MD  [facility] Providers Cardiologist:  Peter Jordan, MD Cardiology APP:  Rana Lum CROME, NP       Patient Profile:       Chief Complaint: 80-month follow-up History of Present Illness:  Mark [name] is a 73 y.o. male with visit-pertinent history of atrial fibrillation, bladder cancer post urostomy, CKD, colon cancer, GERD, hypertension, hyperlipidemia   He was seen by cardiology service during admission on 07/16/2023 for chest pain.  He underwent coronary CTA on 07/16/2023 showing coronary calcium  score of 891 (79th percentile).  FFR showed significant stenosis either at the distal left main or ostial LAD/LCx bifurcation.  Echocardiogram 07/18/2023 showed LVEF 55 to 60%, no RWMA, mild mitral valve regurgitation.  Cardiac catheterization was completed on 07/19/2023 showing 65% distal left main lesion with mild diffuse disease of LAD and left circumflex.  Moderate diffuse disease of the right coronary artery.  He underwent 2V CABG on 6/6 with LIMA-LAD, SVG-OM.  He developed atrial fibrillation during CABG and treated with IV amiodarone .  He converted to NSR and was transitioned to oral regimen.  He was started on midodrine  due to vasoplegia.  His home BP meds were held at discharge.  He was discharged home in NSR.   He was seen for follow-up in office on 08/09/2023.  He was doing well without anginal symptoms.  His blood pressure had improved and his midodrine  was changed to as needed.  He was maintaining NSR and denied any symptoms concerning for recurrent atrial fibrillation.  Last seen in clinic on 11/14/2023.  He was doing well without anginal symptoms.  He had no further episodes of orthostatic hypotension.  He was maintaining NSR.  His amiodarone  was discontinued.  Zio 10/2023 showed normal sinus rhythm with first-degree AV block, 2 brief runs of SVT longest 5 beats,  otherwise rare isolated PACs and PVCs.  No atrial fibrillation present.   Discussed the use of AI scribe software for clinical note transcription with the patient, who gave verbal consent to proceed.  History of Present Illness Mark [name] is a 73 year old male with coronary artery disease who presents with breathing difficulties and low blood pressure. He is accompanied by his daughter.  Today he is doing well without acute cardiovascular concerns.  Reports that his dyspnea does continue to improve.  Furthermore he is without any episodes of chest pains or exertional symptoms.  He denies his prior anginal equivalent.  Reports he has not been taking his midodrine  regularly because he has not had symptomatic hypotension.  He reports intermittent episodes of lightheadedness with sit to stand transitions with no recent falls and improvement over the last 6 to 8 weeks.  Reports he has been adherent to his current medication regimen.  He denies any symptoms concerning for recurrent atrial fibrillation.  Furthermore declines any episodes of palpitations or tachycardia.  He denies syncope, presyncope, melena, hematochezia  Review of systems:  Please see the history of present illness. All other systems are reviewed and otherwise negative.      Studies Reviewed:        Echocardiogram 07/18/2023 1. Left ventricular ejection fraction, by estimation, is 55 to 60%. Left  ventricular ejection fraction by 2D MOD biplane is 55.2 %. The left  ventricle has normal function. The left ventricle has no regional wall  motion abnormalities. Left ventricular  diastolic parameters were normal.   2. Peak RV-RA gradient 17 mmHg. Right ventricular systolic function is  normal. The right ventricular size is normal.   3. Left atrial size was mildly dilated.   4. The mitral valve is normal in structure. Mild mitral valve  regurgitation. No evidence of mitral stenosis.   5. The aortic valve is tricuspid. There  is mild calcification of the  aortic valve. Aortic valve regurgitation is trivial. No aortic stenosis is  present.   6. IVC not visualized.    Cardiac catheterization 07/19/2023   Dist LM to Ost LAD lesion is 65% stenosed.   1.  65% distal left main lesion with mild diffuse disease of the LAD and left circumflex. 2.  Moderate diffuse disease of the right coronary artery; a pressure wire assessment was performed to assess the hemodynamic significance of this burden of coronary atherosclerosis.  The RFR was 0.96 with a pressure wire position in the right posterolateral ventricular branch consistent with hemodynamically insignificant disease. 3.  LVEDP of 14 mmHg Diagnostic Dominance: Right   Risk Assessment/Calculations:    CHA2DS2-VASc Score = 2   This indicates a 2.2% annual risk of stroke. The patient's score is based upon: CHF History: 0 HTN History: 1 Diabetes History: 0 Stroke History: 0 Vascular Disease History: 0 Age Score: 1 Gender Score: 0             Physical Exam:   VS:  BP 112/62 (BP Location: Right Arm, Patient Position: Sitting, Cuff Size: Normal)   Pulse 88   Ht 5' 6.5 (1.689 m)   Wt 159 lb (72.1 kg)   BMI 25.28 kg/m    Wt Readings from Last 3 Encounters:  02/06/24 159 lb (72.1 kg)  11/14/23 153 lb (69.4 kg)  10/06/23 158 lb 4.6 oz (71.8 kg)    GEN: Well nourished, well developed in no acute distress NECK: No JVD; No carotid bruits CARDIAC: RRR, no murmurs, rubs, gallops RESPIRATORY:  Clear to auscultation without rales, wheezing or rhonchi  ABDOMEN: Soft, non-tender, non-distended EXTREMITIES:  No edema; No acute deformity      Assessment and Plan:  Coronary artery disease NSTEMI S/p CABG x 2 utilizing LIMA to LAD and SVG to OM on 07/2023 LHC 07/2023 showed distal LM to ostial LAD with 65% stenosis Echocardiogram 07/18/2023 showed EF 55 to 60% - Today he is stable without chest pains.  He he remains without exertional symptoms.  Denies prior anginal  equivalent no indication for further ischemic evaluation at this time - Developed vasoplegia postop and managed on midodrine  3 times daily as needed - Notes occasional lightheadedness when going from sitting to standing.  Symptoms continue to improve and not having to use as needed midodrine .  He understands the recommended as needed use to prevent orthostatic hypotension - No beta-blocker due to labile BP in hospital - Encouraged adequate hydration - Continue DAPT with aspirin  81 mg daily and clopidogrel  75 mg daily - Continue rosuvastatin  40 mg daily and ezetimibe  10 mg daily   Paroxysmal atrial fibrillation Remote history in 2000 had been on OAC in the past however was discontinued due to lack of recurrent episode - PostOp CABG 07/2023 he developed atrial fibrillation and was treated with IV amiodarone  and converted to NSR.  DC'd on oral amiodarone  - Follow-up 10/2023 he was maintaining NSR and amiodarone  was discontinued - Follow-up ZIO 01/2024 without PAF - Today he is maintaining sinus rhythm on auscultation   Hyperlipidemia, LDL goal <55 Lipoprotein (  a) 15.3 LDL 31 on 10/2023 well-controlled - Continue rosuvastatin  40 mg daily and ezetimibe  10 mg daily   Hypertension Blood pressure today is well-controlled at 112/62 - Developed vasoplegia postop CABG and PTA antihypertensives were held - Will occasionally experience lightheadedness on positional check - Continue to monitor blood pressure closely at home - Continue midodrine  5 mg 3 times daily as needed  Preoperative cardiovascular clearance According to the Revised Cardiac Risk Index (RCRI), his Perioperative Risk of Major Cardiac Event is (%): 0.9. His Functional Capacity in METs is: 5.62 according to the Duke Activity Status Index (DASI).  Therefore, based on ACC/AHA guidelines, patient would be at acceptable risk for the planned procedure without further cardiovascular testing. I will route this recommendation to the requesting  party via Epic fax function.  Per Dr. Jordan: I would be OK with holding Plavix  but I would continue ASA. Generally not necessary to hold antiplatelet therapy for planned procedure      Dispo:  Return in about 6 months (around 08/06/2024).  Signed, Lum LITTIE Louis, NP  "
